# Patient Record
Sex: Male | Born: 1955 | Race: White | Hispanic: No | Marital: Married | Smoking: Current some day smoker
Health system: Southern US, Community
[De-identification: ages and names within clinical notes are randomized; demographics above are authoritative.]

## PROBLEM LIST (undated history)

## (undated) DIAGNOSIS — F319 Bipolar disorder, unspecified: Secondary | ICD-10-CM

## (undated) DIAGNOSIS — E7800 Pure hypercholesterolemia, unspecified: Secondary | ICD-10-CM

## (undated) DIAGNOSIS — I8500 Esophageal varices without bleeding: Secondary | ICD-10-CM

## (undated) DIAGNOSIS — D649 Anemia, unspecified: Secondary | ICD-10-CM

## (undated) DIAGNOSIS — G629 Polyneuropathy, unspecified: Secondary | ICD-10-CM

## (undated) DIAGNOSIS — F419 Anxiety disorder, unspecified: Secondary | ICD-10-CM

## (undated) DIAGNOSIS — K7460 Unspecified cirrhosis of liver: Secondary | ICD-10-CM

## (undated) DIAGNOSIS — D696 Thrombocytopenia, unspecified: Secondary | ICD-10-CM

## (undated) DIAGNOSIS — K219 Gastro-esophageal reflux disease without esophagitis: Secondary | ICD-10-CM

## (undated) DIAGNOSIS — K7581 Nonalcoholic steatohepatitis (NASH): Secondary | ICD-10-CM

## (undated) DIAGNOSIS — Z9289 Personal history of other medical treatment: Secondary | ICD-10-CM

## (undated) DIAGNOSIS — E119 Type 2 diabetes mellitus without complications: Secondary | ICD-10-CM

## (undated) DIAGNOSIS — I82409 Acute embolism and thrombosis of unspecified deep veins of unspecified lower extremity: Secondary | ICD-10-CM

## (undated) DIAGNOSIS — F209 Schizophrenia, unspecified: Secondary | ICD-10-CM

## (undated) DIAGNOSIS — F329 Major depressive disorder, single episode, unspecified: Secondary | ICD-10-CM

## (undated) DIAGNOSIS — J45909 Unspecified asthma, uncomplicated: Secondary | ICD-10-CM

## (undated) DIAGNOSIS — F32A Depression, unspecified: Secondary | ICD-10-CM

## (undated) HISTORY — PX: IVC FILTER PLACEMENT (ARMC HX): HXRAD1551

## (undated) HISTORY — PX: US GUIDED PARACENTESIS (ARMC HX): HXRAD1166

## (undated) HISTORY — DX: Acute embolism and thrombosis of unspecified deep veins of unspecified lower extremity: I82.409

## (undated) HISTORY — PX: CARDIAC CATHETERIZATION: SHX172

## (undated) HISTORY — PX: CATARACT EXTRACTION W/ INTRAOCULAR LENS IMPLANT: SHX1309

## (undated) HISTORY — PX: FINGER SURGERY: SHX640

---

## 1998-05-14 ENCOUNTER — Emergency Department (HOSPITAL_COMMUNITY): Admission: EM | Admit: 1998-05-14 | Discharge: 1998-05-14 | Payer: Self-pay | Admitting: Emergency Medicine

## 2002-09-19 ENCOUNTER — Emergency Department (HOSPITAL_COMMUNITY): Admission: EM | Admit: 2002-09-19 | Discharge: 2002-09-19 | Payer: Self-pay | Admitting: Emergency Medicine

## 2002-09-19 ENCOUNTER — Encounter: Payer: Self-pay | Admitting: Emergency Medicine

## 2004-02-17 ENCOUNTER — Ambulatory Visit (HOSPITAL_COMMUNITY): Admission: RE | Admit: 2004-02-17 | Discharge: 2004-02-17 | Payer: Self-pay | Admitting: Gastroenterology

## 2006-03-21 ENCOUNTER — Encounter: Admission: RE | Admit: 2006-03-21 | Discharge: 2006-03-21 | Payer: Self-pay | Admitting: Occupational Medicine

## 2016-01-19 ENCOUNTER — Inpatient Hospital Stay (HOSPITAL_COMMUNITY)
Admission: EM | Admit: 2016-01-19 | Discharge: 2016-01-22 | DRG: 442 | Disposition: A | Discharge status: Home / Self Care | Payer: Non-veteran care | Attending: Internal Medicine | Admitting: Internal Medicine

## 2016-01-19 ENCOUNTER — Encounter (HOSPITAL_COMMUNITY): Payer: Self-pay | Admitting: Vascular Surgery

## 2016-01-19 ENCOUNTER — Emergency Department (HOSPITAL_COMMUNITY): Payer: Non-veteran care

## 2016-01-19 DIAGNOSIS — R195 Other fecal abnormalities: Secondary | ICD-10-CM | POA: Diagnosis not present

## 2016-01-19 DIAGNOSIS — F17210 Nicotine dependence, cigarettes, uncomplicated: Secondary | ICD-10-CM | POA: Diagnosis present

## 2016-01-19 DIAGNOSIS — T383X6A Underdosing of insulin and oral hypoglycemic [antidiabetic] drugs, initial encounter: Secondary | ICD-10-CM | POA: Diagnosis present

## 2016-01-19 DIAGNOSIS — I252 Old myocardial infarction: Secondary | ICD-10-CM

## 2016-01-19 DIAGNOSIS — I8510 Secondary esophageal varices without bleeding: Secondary | ICD-10-CM | POA: Diagnosis present

## 2016-01-19 DIAGNOSIS — E119 Type 2 diabetes mellitus without complications: Secondary | ICD-10-CM

## 2016-01-19 DIAGNOSIS — Z8711 Personal history of peptic ulcer disease: Secondary | ICD-10-CM

## 2016-01-19 DIAGNOSIS — Z720 Tobacco use: Secondary | ICD-10-CM

## 2016-01-19 DIAGNOSIS — E118 Type 2 diabetes mellitus with unspecified complications: Secondary | ICD-10-CM

## 2016-01-19 DIAGNOSIS — Y92009 Unspecified place in unspecified non-institutional (private) residence as the place of occurrence of the external cause: Secondary | ICD-10-CM

## 2016-01-19 DIAGNOSIS — Z794 Long term (current) use of insulin: Secondary | ICD-10-CM

## 2016-01-19 DIAGNOSIS — R079 Chest pain, unspecified: Secondary | ICD-10-CM | POA: Diagnosis present

## 2016-01-19 DIAGNOSIS — Z7982 Long term (current) use of aspirin: Secondary | ICD-10-CM

## 2016-01-19 DIAGNOSIS — K7460 Unspecified cirrhosis of liver: Secondary | ICD-10-CM | POA: Diagnosis present

## 2016-01-19 DIAGNOSIS — K921 Melena: Secondary | ICD-10-CM

## 2016-01-19 DIAGNOSIS — D62 Acute posthemorrhagic anemia: Secondary | ICD-10-CM | POA: Diagnosis present

## 2016-01-19 DIAGNOSIS — D649 Anemia, unspecified: Secondary | ICD-10-CM | POA: Diagnosis present

## 2016-01-19 DIAGNOSIS — Z8719 Personal history of other diseases of the digestive system: Secondary | ICD-10-CM

## 2016-01-19 DIAGNOSIS — Z86718 Personal history of other venous thrombosis and embolism: Secondary | ICD-10-CM

## 2016-01-19 DIAGNOSIS — E7800 Pure hypercholesterolemia, unspecified: Secondary | ICD-10-CM | POA: Diagnosis present

## 2016-01-19 DIAGNOSIS — Z91128 Patient's intentional underdosing of medication regimen for other reason: Secondary | ICD-10-CM

## 2016-01-19 DIAGNOSIS — F17200 Nicotine dependence, unspecified, uncomplicated: Secondary | ICD-10-CM | POA: Diagnosis present

## 2016-01-19 DIAGNOSIS — Z9114 Patient's other noncompliance with medication regimen: Secondary | ICD-10-CM

## 2016-01-19 DIAGNOSIS — K210 Gastro-esophageal reflux disease with esophagitis: Secondary | ICD-10-CM | POA: Diagnosis present

## 2016-01-19 DIAGNOSIS — K766 Portal hypertension: Secondary | ICD-10-CM | POA: Diagnosis not present

## 2016-01-19 DIAGNOSIS — K219 Gastro-esophageal reflux disease without esophagitis: Secondary | ICD-10-CM | POA: Diagnosis not present

## 2016-01-19 DIAGNOSIS — R739 Hyperglycemia, unspecified: Secondary | ICD-10-CM

## 2016-01-19 DIAGNOSIS — K922 Gastrointestinal hemorrhage, unspecified: Secondary | ICD-10-CM | POA: Diagnosis present

## 2016-01-19 DIAGNOSIS — K3189 Other diseases of stomach and duodenum: Secondary | ICD-10-CM | POA: Diagnosis present

## 2016-01-19 DIAGNOSIS — I493 Ventricular premature depolarization: Secondary | ICD-10-CM | POA: Diagnosis present

## 2016-01-19 DIAGNOSIS — E1142 Type 2 diabetes mellitus with diabetic polyneuropathy: Secondary | ICD-10-CM | POA: Diagnosis present

## 2016-01-19 DIAGNOSIS — D696 Thrombocytopenia, unspecified: Secondary | ICD-10-CM | POA: Diagnosis present

## 2016-01-19 DIAGNOSIS — E1165 Type 2 diabetes mellitus with hyperglycemia: Secondary | ICD-10-CM | POA: Diagnosis present

## 2016-01-19 DIAGNOSIS — K7581 Nonalcoholic steatohepatitis (NASH): Secondary | ICD-10-CM | POA: Diagnosis present

## 2016-01-19 HISTORY — DX: Pure hypercholesterolemia, unspecified: E78.00

## 2016-01-19 HISTORY — DX: Type 2 diabetes mellitus without complications: E11.9

## 2016-01-19 HISTORY — DX: Thrombocytopenia, unspecified: D69.6

## 2016-01-19 HISTORY — DX: Polyneuropathy, unspecified: G62.9

## 2016-01-19 HISTORY — DX: Unspecified cirrhosis of liver: K74.60

## 2016-01-19 HISTORY — DX: Esophageal varices without bleeding: I85.00

## 2016-01-19 LAB — I-STAT VENOUS BLOOD GAS, ED
ACID-BASE DEFICIT: 1 mmol/L (ref 0.0–2.0)
Bicarbonate: 21.3 mmol/L (ref 20.0–28.0)
O2 SAT: 99 %
PO2 VEN: 110 mmHg — AB (ref 32.0–45.0)
TCO2: 22 mmol/L (ref 0–100)
pCO2, Ven: 28.2 mmHg — ABNORMAL LOW (ref 44.0–60.0)
pH, Ven: 7.485 — ABNORMAL HIGH (ref 7.250–7.430)

## 2016-01-19 LAB — COMPREHENSIVE METABOLIC PANEL
ALK PHOS: 92 U/L (ref 38–126)
ALT: 64 U/L — AB (ref 17–63)
AST: 63 U/L — AB (ref 15–41)
Albumin: 3.4 g/dL — ABNORMAL LOW (ref 3.5–5.0)
Anion gap: 9 (ref 5–15)
BUN: 11 mg/dL (ref 6–20)
CALCIUM: 8.8 mg/dL — AB (ref 8.9–10.3)
CHLORIDE: 105 mmol/L (ref 101–111)
CO2: 20 mmol/L — AB (ref 22–32)
CREATININE: 0.64 mg/dL (ref 0.61–1.24)
GFR calc Af Amer: >60 mL/min (ref >60)
GFR calc non Af Amer: >60 mL/min (ref >60)
GLUCOSE: 411 mg/dL — AB (ref 65–99)
Potassium: 4 mmol/L (ref 3.5–5.1)
SODIUM: 134 mmol/L — AB (ref 135–145)
Total Bilirubin: 1.7 mg/dL — ABNORMAL HIGH (ref 0.3–1.2)
Total Protein: 6.1 g/dL — ABNORMAL LOW (ref 6.5–8.1)

## 2016-01-19 LAB — I-STAT TROPONIN, ED: TROPONIN I, POC: 0 ng/mL (ref 0.00–0.08)

## 2016-01-19 LAB — CBC WITH DIFFERENTIAL/PLATELET
BASOS ABS: 0 10*3/uL (ref 0.0–0.1)
Basophils Relative: 1 %
EOS ABS: 0.1 10*3/uL (ref 0.0–0.7)
EOS PCT: 2 %
HCT: 35.7 % — ABNORMAL LOW (ref 39.0–52.0)
HEMOGLOBIN: 12.9 g/dL — AB (ref 13.0–17.0)
LYMPHS ABS: 1.3 10*3/uL (ref 0.7–4.0)
LYMPHS PCT: 30 %
MCH: 31.5 pg (ref 26.0–34.0)
MCHC: 36.1 g/dL — ABNORMAL HIGH (ref 30.0–36.0)
MCV: 87.1 fL (ref 78.0–100.0)
Monocytes Absolute: 0.3 10*3/uL (ref 0.1–1.0)
Monocytes Relative: 6 %
NEUTROS PCT: 61 %
Neutro Abs: 2.7 10*3/uL (ref 1.7–7.7)
PLATELETS: 62 10*3/uL — AB (ref 150–400)
RBC: 4.1 MIL/uL — AB (ref 4.22–5.81)
RDW: 13.2 % (ref 11.5–15.5)
WBC: 4.4 10*3/uL (ref 4.0–10.5)

## 2016-01-19 LAB — PROTIME-INR
INR: 1.17
Prothrombin Time: 15 seconds (ref 11.4–15.2)

## 2016-01-19 LAB — APTT: APTT: 25 s (ref 24–36)

## 2016-01-19 LAB — POC OCCULT BLOOD, ED: FECAL OCCULT BLD: POSITIVE — AB

## 2016-01-19 LAB — TYPE AND SCREEN
ABO/RH(D): A POS
Antibody Screen: NEGATIVE

## 2016-01-19 LAB — CBG MONITORING, ED: Glucose-Capillary: 341 mg/dL — ABNORMAL HIGH (ref 65–99)

## 2016-01-19 LAB — BRAIN NATRIURETIC PEPTIDE: B NATRIURETIC PEPTIDE 5: 26.8 pg/mL (ref 0.0–100.0)

## 2016-01-19 LAB — ABO/RH: ABO/RH(D): A POS

## 2016-01-19 MED ORDER — INSULIN DETEMIR 100 UNIT/ML ~~LOC~~ SOLN
20.0000 [IU] | Freq: Every day | SUBCUTANEOUS | Status: DC
  Administered 2016-01-20 (×2): 20 [IU] via SUBCUTANEOUS
  Filled 2016-01-19 (×3): qty 0.2

## 2016-01-19 MED ORDER — ONDANSETRON HCL 4 MG/2ML IJ SOLN: 4.0000 mg | Freq: Four times a day (QID) | INTRAMUSCULAR | Status: DC | PRN

## 2016-01-19 MED ORDER — INSULIN ASPART 100 UNIT/ML ~~LOC~~ SOLN
0.0000 [IU] | SUBCUTANEOUS | Status: DC
  Administered 2016-01-20: 8 [IU] via SUBCUTANEOUS

## 2016-01-19 MED ORDER — GABAPENTIN 300 MG PO CAPS
900.0000 mg | ORAL_CAPSULE | Freq: Every day | ORAL | Status: DC
  Administered 2016-01-20 – 2016-01-21 (×3): 900 mg via ORAL
  Filled 2016-01-19 (×3): qty 3

## 2016-01-19 MED ORDER — VITAMIN B-12 1000 MCG PO TABS
500.0000 ug | ORAL_TABLET | Freq: Every day | ORAL | Status: DC
  Administered 2016-01-20 – 2016-01-22 (×3): 500 ug via ORAL
  Filled 2016-01-19 (×3): qty 1

## 2016-01-19 MED ORDER — LORATADINE 10 MG PO TABS
10.0000 mg | ORAL_TABLET | Freq: Every day | ORAL | Status: DC
  Administered 2016-01-20 – 2016-01-22 (×3): 10 mg via ORAL
  Filled 2016-01-19 (×3): qty 1

## 2016-01-19 MED ORDER — SODIUM CHLORIDE 0.9 % IV SOLN
INTRAVENOUS | Status: DC
  Administered 2016-01-20: 1000 mL via INTRAVENOUS
  Administered 2016-01-20: 15:00:00 via INTRAVENOUS

## 2016-01-19 MED ORDER — SODIUM CHLORIDE 0.9 % IV BOLUS (SEPSIS)
1000.0000 mL | Freq: Once | INTRAVENOUS | Status: AC
  Administered 2016-01-19: 1000 mL via INTRAVENOUS

## 2016-01-19 MED ORDER — FOLIC ACID 1 MG PO TABS
1.0000 mg | ORAL_TABLET | Freq: Every day | ORAL | Status: DC
  Administered 2016-01-20 – 2016-01-22 (×3): 1 mg via ORAL
  Filled 2016-01-19 (×3): qty 1

## 2016-01-19 MED ORDER — NADOLOL 20 MG PO TABS
20.0000 mg | ORAL_TABLET | Freq: Every day | ORAL | Status: DC
  Administered 2016-01-20 – 2016-01-22 (×3): 20 mg via ORAL
  Filled 2016-01-19 (×3): qty 1

## 2016-01-19 MED ORDER — MORPHINE SULFATE (PF) 2 MG/ML IV SOLN: 2.0000 mg | INTRAVENOUS | Status: DC | PRN

## 2016-01-19 MED ORDER — PANTOPRAZOLE SODIUM 40 MG PO TBEC: 40.0000 mg | DELAYED_RELEASE_TABLET | Freq: Two times a day (BID) | ORAL | Status: DC

## 2016-01-19 MED ORDER — ACETAMINOPHEN 325 MG PO TABS: 650.0000 mg | ORAL_TABLET | ORAL | Status: DC | PRN

## 2016-01-19 NOTE — H&P (Signed)
History and Physical    Wayne JunglingDuke L Atkin ONG:[id number] DOB: Feb 09, 1956 DOA: 01/19/2016  Referring MD/NP/PA: Sharilyn SitesLisa Sanders, PA-C PCP: Pcp Not In System  Patient coming from:  Home  Chief Complaint:  Chest Pain  HPI: Wayne JunglingDuke L Palo is a 60 y.o. male with medical history significant of cirrhosis with esophageal varices, PUD, GI bleed, DVT s/p IVC, thrombocytopenia, tobacco abuse; who presents with a 2-3 week history of chest pain. Symptoms had occurred intermittently and are located substernal to epigastric region. Today however symptoms were more constant. Describes pain more so as a pressure sensation and rates it a 5 out of 10 on the pain scale. Symptoms worse with exertion. Associated symptoms include some mild shortness of breath and fatigue. Denies any radiation of pain, fever, chills, diaphoresis, abdominal swelling, nausea, or vomiting. He has had a lot of social stressors going on for the last few weeks and therefore has not been consistently taking any of his medications. The last 2 days or so he's also noticed that his stools are black and tarry in nature. Patient notes that the symptoms feel like it could possibly be acid reflux. [facility] [place].  Previously, he had issues with bleeding and was found to have esophageal varices. He notes that he does smoke anywhere from 5-9 cigarettes per day.  ED Course: Plan admission to the emergency department patient was seen to be afebrile with respirations of 25, and all other vitals within normal limits. Laboratory work revealed hemoglobin 12.9, platelets 62, sodium 134, potassium 4, chloride 105, CO2 20, BUN 11, creatinine 0.64, calcium 8.8, glucose 411, and anion gap 9. He was noted to be guiac positive. Initial troponin negative. EKG showing intermittent PVCs, but no significant ischemic signs. Chest x-ray showed no acute abnormalities.  Review of Systems: As per HPI otherwise 10 point review of systems negative.    Past Medical History:  Diagnosis Date  . Diabetes mellitus without complication (HCC)   . DVT (deep vein thrombosis) in pregnancy   . Esophageal varices (HCC)   . Hypercholesteremia   . Non-alcoholic cirrhosis (HCC)   . Peripheral neuropathy (HCC)   . Thrombocytopenia (HCC)     Past Surgical History:  Procedure Laterality Date  . FINGER SURGERY    . IVC FILTER PLACEMENT ([facility] HX)    . US GUIDED PARACENTESIS ([facility] HX)       reports that he has been smoking Cigarettes.  He has been smoking about 0.25 packs per day. He has never used smokeless tobacco. He reports that he does not drink alcohol or use drugs.  No Known Allergies  No family history on file.  Prior to Admission medications   Medication Sig Start Date End Date Taking? Authorizing Provider  acetaminophen (TYLENOL) 500 MG tablet Take 1,500 mg by mouth every 6 (six) hours as needed for mild pain.   Yes Historical Provider, MD  aspirin 325 MG tablet Take 325 mg by mouth daily.   Yes Historical Provider, MD  cetirizine (ZYRTEC) 10 MG tablet Take 10 mg by mouth daily.   Yes Historical Provider, MD  cyanocobalamin 500 MCG tablet Take 500 mcg by mouth daily.   Yes Historical Provider, MD  folic acid (FOLVITE) 1 MG tablet Take 1 mg by mouth daily.   Yes Historical Provider, MD  gabapentin (NEURONTIN) 300 MG capsule Take 900 mg by mouth at bedtime.   Yes Historical Provider, MD  insulin regular human CONCENTRATED (HUMULIN R) 500 UNIT/ML injection  Inject 100 Units into the skin 2 (two) times daily.   Yes Historical Provider, MD  Multiple Vitamins-Minerals (PRESERVISION AREDS 2 PO) Take 2 capsules by mouth daily.   Yes Historical Provider, MD  nadolol (CORGARD) 20 MG tablet Take 20 mg by mouth daily.   Yes Historical Provider, MD  ondansetron (ZOFRAN) 8 MG tablet Take 8 mg by mouth every 8 (eight) hours as needed for nausea or vomiting.   Yes Historical Provider, MD  pantoprazole (PROTONIX) 40 MG tablet Take 40 mg by mouth 2  (two) times daily.   Yes Historical Provider, MD  sertraline (ZOLOFT) 100 MG tablet Take 100 mg by mouth daily.   Yes Historical Provider, MD    Physical Exam:  Constitutional: Older male in NAD, calm, comfortable Vitals:   01/19/16 2149 01/19/16 2215 01/19/16 2230 01/19/16 2245  BP: 147/84 142/83 133/82 125/80  Pulse: 82 85 88 83  Resp: 17 21 20 25   Temp:      TempSrc:      SpO2: 99% 92% 92% 93%   Eyes: PERRL, lids and conjunctivae normal ENMT: Mucous membranes are moist. Posterior pharynx clear of any exudate or lesions.Normal dentition.  Neck: normal, supple, no masses, no thyromegaly Respiratory: clear to auscultation bilaterally, no wheezing, no crackles. Normal respiratory effort. No accessory muscle use.  Cardiovascular: Regular rate and rhythm, no murmurs / rubs / gallops. No extremity edema. 2+ pedal pulses. No carotid bruits.  Abdomen: no tenderness or significant distention, no masses palpated. No hepatosplenomegaly. Bowel sounds positive.  Musculoskeletal: no clubbing / cyanosis. No joint deformity upper and lower extremities. Good ROM, no contractures. Normal muscle tone.  Skin: no rashes, lesions, ulcers. No induration Neurologic: CN 2-12 grossly intact. Sensation intact, DTR normal. Strength 5/5 in all 4.  Psychiatric: Poor judgment and insight. Alert and oriented x 3. Normal mood.     Labs on Admission: I have personally reviewed following labs and imaging studies  CBC:  Recent Labs Lab 01/19/16 2133  WBC 4.4  NEUTROABS 2.7  HGB 12.9*  HCT 35.7*  MCV 87.1  PLT 62*   Basic Metabolic Panel:  Recent Labs Lab 01/19/16 2133  NA 134*  K 4.0  CL 105  CO2 20*  GLUCOSE 411*  BUN 11  CREATININE 0.64  CALCIUM 8.8*   GFR: CrCl cannot be calculated (Unknown ideal weight.). Liver Function Tests:  Recent Labs Lab 01/19/16 2133  AST 63*  ALT 64*  ALKPHOS 92  BILITOT 1.7*  PROT 6.1*  ALBUMIN 3.4*   No results for input(s): LIPASE, AMYLASE in the  last 168 hours. No results for input(s): AMMONIA in the last 168 hours. Coagulation Profile:  Recent Labs Lab 01/19/16 2133  INR 1.17   Cardiac Enzymes: No results for input(s): CKTOTAL, CKMB, CKMBINDEX, TROPONINI in the last 168 hours. BNP (last 3 results) No results for input(s): PROBNP in the last 8760 hours. HbA1C: No results for input(s): HGBA1C in the last 72 hours. CBG:  Recent Labs Lab 01/19/16 2251  GLUCAP 341*   Lipid Profile: No results for input(s): CHOL, HDL, LDLCALC, TRIG, CHOLHDL, LDLDIRECT in the last 72 hours. Thyroid Function Tests: No results for input(s): TSH, T4TOTAL, FREET4, T3FREE, THYROIDAB in the last 72 hours. Anemia Panel: No results for input(s): VITAMINB12, FOLATE, FERRITIN, TIBC, IRON, RETICCTPCT in the last 72 hours. Urine analysis: No results found for: COLORURINE, APPEARANCEUR, LABSPEC, PHURINE, GLUCOSEU, HGBUR, BILIRUBINUR, KETONESUR, PROTEINUR, UROBILINOGEN, NITRITE, LEUKOCYTESUR Sepsis Labs: No results found for this or any previous visit (  from the past 240 hour(s)).   Radiological Exams on Admission: Dg Chest 2 View  Result Date: 01/19/2016 CLINICAL DATA:  Substernal and epigastric chest pain with fatigue. Symptoms for 2-3 weeks. Shortness of breath. EXAM: CHEST  2 VIEW COMPARISON:  03/21/2006 FINDINGS: Normal heart size and pulmonary vascularity. No focal airspace disease or consolidation in the lungs. No blunting of costophrenic angles. No pneumothorax. Mediastinal contours appear intact. Degenerative changes in the spine. IMPRESSION: No active cardiopulmonary disease. Electronically Signed   By: Burman Nieves M.D.   On: 01/19/2016 21:43    EKG: Independently reviewed. Sinus rhythm with multiple PVCs  Assessment/Plan Chest Pain: Acute. Intermittent over the last 2-3 weeks. Heart score = 4. Patient notes being consistently compliant with medication regimens. Question of symptoms could be secondary to acid reflux. - Admit to a  telemetry bed  - Trend troponins - Recheck EKG in a.m.  - Check echocardiogram - GI cocktail prn indigestion - Morphine prn pain  Hyperglycemia with diabetes mellitus type 2, uncontrolled: Initial blood glucose was noted to be as high as 523.  - Continue IV fluids normal saline at 100 ml/hr - Levemir 20 units subcutaneous twice a day, adjust dose as needed - CBG with moderate SSI q AC  - Monitor electrolytes  Guaiac positive stool: Patient found to be guaiac positive stools, but hemodynamically stable with hemoglobin higher than baseline. Previous history of PUD and patient noncompliant with Medications. - monitor blood counts - Protonix 40 mg po BID - Consider need of consultation to gastroenterology in a.m.  Liver cirrhosis with esophageal varices: Last EGD noted some time last year showing esophageal varices.  - Continue nadolol  Anemia: Hemoglobin 12.8 on admission - Continue folic acid and vitamin B12   Thrombocytopenia: Chronic - Continue to monitor  Peripheral neuropathy - Continue gabapentin   Tobacco abuse - Counseled on the need of cessation of tobacco abuse   GERD - Continue Protonix   DVT prophylaxis: SCD  Code Status: Full Family Communication: no family present at bedside Disposition Plan: Possible discharge medically stable and Consults called: none Admission status: Observation telemetry  Clydie Braun MD [facility] Pager [phone]  If 7PM-7AM, please contact night-coverage [email or web address] Password [facility]  01/19/2016, 10:57 PM

## 2016-01-19 NOTE — ED Triage Notes (Signed)
Pt reports to the ED for eval of substernal/epigastric CP. Pt reports he has been having this pain x 2-3 weeks. He has also had some exertional SOB as well. Pt also reports he has been having dark tarry stools. Pt has hx of GERD. He has also been hyperglycemic recently patient attributes it to stress. CBG 523 mg/dl upon EMS arrival. 12 lead en route unremarkable.

## 2016-01-19 NOTE — ED Provider Notes (Signed)
[facility]-EMERGENCY DEPT Provider Note   CSN: [id number] Arrival date & time: 01/19/16  1956     History   Chief Complaint Chief Complaint  Patient presents with  . Chest Pain    HPI Wayne Benson is a 60 y.o. male.  The history is provided by the patient and medical records.  Chest Pain   Associated symptoms include shortness of breath.   60 year old male with history of diabetes, DVT status post IVC filter placement, hyperlipidemia, cirrhosis secondary to diabetic medications, esophageal varices, neuropathy, presenting to the ED for chest pain and fatigue. Patient reports he has pain in his mid/substernal region for the past 2 weeks. States this is been waxing and waning and is very mild but is still a notable discomfort. He reports some shortness of breath, mostly with exertional activities. He has not had any diaphoresis, nausea, vomiting, neck or arm pain. States he was told he may have had a mild heart attack in the past, however this was not confirmed. He has not had any stents or CABG. States he smokes a few cigarettes a day routinely.  Patient also complains of dark, tarry stools. States stools have been black in color, no frank blood noted. He denies any abdominal or rectal pain. States he did have a colonoscopy last year which he thinks was normal. He is not currently on anticoagulation. States he feels somewhat fatigued.  No dizziness or syncope.  Was taking iron and B12 supplements but after his last blood check he stopped taking them because he was told his levels were normal.  On arrival to ED patient is hyperglycemic with sugar in the 500s. He reports he was started on a new insulin recently, but has not been taking this as directed. He states he has a lot of "stress" in his life and has been off of his medications recently.  Past Medical History:  Diagnosis Date  . Diabetes mellitus without complication (HCC)   . DVT (deep vein thrombosis) in pregnancy   . Esophageal  varices (HCC)   . Hypercholesteremia   . Non-alcoholic cirrhosis (HCC)   . Peripheral neuropathy (HCC)   . Thrombocytopenia (HCC)     There are no active problems to display for this patient.   Past Surgical History:  Procedure Laterality Date  . FINGER SURGERY    . IVC FILTER PLACEMENT ([facility] HX)    . US GUIDED PARACENTESIS ([facility] HX)         Home Medications    Prior to Admission medications   Not on File    Family History No family history on file.  Social History Social History  Substance Use Topics  . Smoking status: Current Every Day Smoker    Packs/day: 0.25    Types: Cigarettes  . Smokeless tobacco: Never Used  . Alcohol use No     Allergies   Review of patient's allergies indicates not on file.   Review of Systems Review of Systems  Constitutional: Positive for fatigue.  Respiratory: Positive for shortness of breath.   Cardiovascular: Positive for chest pain.  Gastrointestinal:       Dark stools  All other systems reviewed and are negative.    Physical Exam Updated Vital Signs BP 144/89 (BP Location: Right Arm)   Pulse 85   Temp 98.4 F (36.9 C) (Oral)   Resp 16   SpO2 98%   Physical Exam  Constitutional: He is oriented to person, place, and time. He appears well-developed and well-nourished.  HENT:  Head: Normocephalic and atraumatic.  Mouth/Throat: Oropharynx is clear and moist.  Eyes: Conjunctivae and EOM are normal. Pupils are equal, round, and reactive to light.  Neck: Normal range of motion.  Cardiovascular: Normal rate, regular rhythm and normal heart sounds.   Pulmonary/Chest: Effort normal and breath sounds normal. No respiratory distress. He has no wheezes. He has no rhonchi.  No distress, lungs overall clear, speaking in full sentences without difficulty  Abdominal: Soft. Bowel sounds are normal.  Genitourinary:  Genitourinary Comments: Rectum normal in appearance, no hemorrhoids or masses, dark brown stool in rectum; no  frank blood  Musculoskeletal: Normal range of motion.  No leg edema No calf pain, asymmetry, tenderness, or overlying skin changes  Neurological: He is alert and oriented to person, place, and time.  Skin: Skin is warm and dry.  Psychiatric: He has a normal mood and affect.  Nursing note and vitals reviewed.    ED Treatments / Results  Labs (all labs ordered are listed, but only abnormal results are displayed) Labs Reviewed  CBC WITH DIFFERENTIAL/PLATELET - Abnormal; Notable for the following:       Result Value   RBC 4.10 (*)    Hemoglobin 12.9 (*)    HCT 35.7 (*)    MCHC 36.1 (*)    Platelets 62 (*)    All other components within normal limits  COMPREHENSIVE METABOLIC PANEL - Abnormal; Notable for the following:    Sodium 134 (*)    CO2 20 (*)    Glucose, Bld 411 (*)    Calcium 8.8 (*)    Total Protein 6.1 (*)    Albumin 3.4 (*)    AST 63 (*)    ALT 64 (*)    Total Bilirubin 1.7 (*)    All other components within normal limits  POC OCCULT BLOOD, ED - Abnormal; Notable for the following:    Fecal Occult Bld POSITIVE (*)    All other components within normal limits  I-STAT VENOUS BLOOD GAS, ED - Abnormal; Notable for the following:    pH, Ven 7.485 (*)    pCO2, Ven 28.2 (*)    pO2, Ven 110.0 (*)    All other components within normal limits  CBG MONITORING, ED - Abnormal; Notable for the following:    Glucose-Capillary 341 (*)    All other components within normal limits  PROTIME-INR  APTT  BRAIN NATRIURETIC PEPTIDE  D-DIMER, QUANTITATIVE (NOT AT [facility])  TROPONIN I  TROPONIN I  TROPONIN I  HEMOGLOBIN AND HEMATOCRIT, BLOOD  HEMOGLOBIN AND HEMATOCRIT, BLOOD  HEMOGLOBIN AND HEMATOCRIT, BLOOD  BASIC METABOLIC PANEL  BASIC METABOLIC PANEL  CALCIUM, IONIZED  I-STAT TROPOININ, ED  TYPE AND SCREEN  ABO/RH    EKG  EKG Interpretation None       Radiology Dg Chest 2 View  Result Date: 01/19/2016 CLINICAL DATA:  Substernal and epigastric chest pain with  fatigue. Symptoms for 2-3 weeks. Shortness of breath. EXAM: CHEST  2 VIEW COMPARISON:  03/21/2006 FINDINGS: Normal heart size and pulmonary vascularity. No focal airspace disease or consolidation in the lungs. No blunting of costophrenic angles. No pneumothorax. Mediastinal contours appear intact. Degenerative changes in the spine. IMPRESSION: No active cardiopulmonary disease. Electronically Signed   By: Burman NievesWilliam  Stevens M.D.   On: 01/19/2016 21:43    Procedures Procedures (including critical care time)  Medications Ordered in ED Medications  pantoprazole (PROTONIX) EC tablet 40 mg (not administered)  nadolol (CORGARD) tablet 20 mg (not administered)  loratadine (  CLARITIN) tablet 10 mg (not administered)  vitamin B-12 (CYANOCOBALAMIN) tablet 500 mcg (not administered)  folic acid (FOLVITE) tablet 1 mg (not administered)  gabapentin (NEURONTIN) capsule 900 mg (not administered)  acetaminophen (TYLENOL) tablet 650 mg (not administered)  ondansetron (ZOFRAN) injection 4 mg (not administered)  insulin detemir (LEVEMIR) injection 20 Units (not administered)  insulin aspart (novoLOG) injection 0-15 Units (not administered)  morphine 2 MG/ML injection 2 mg (not administered)  0.9 %  sodium chloride infusion (not administered)  sodium chloride 0.9 % bolus 1,000 mL (0 mLs Intravenous Stopped 01/19/16 2153)  sodium chloride 0.9 % bolus 1,000 mL (1,000 mLs Intravenous New Bag/Given 01/19/16 2253)     Initial Impression / Assessment and Plan / ED Course  I have reviewed the triage vital signs and the nursing notes.  Pertinent labs & imaging results that were available during my care of the patient were reviewed by me and considered in my medical decision making (see chart for details).  Clinical Course   60 year old male here with multiple complaints. Notably, chest pain, dark stools, and fatigue. He was hyperglycemic on arrival. He reports he has been off of all his medications for the past few  weeks due to "stress". He is afebrile and nontoxic in appearance. Vital signs are stable on room air. EKG without acute ischemia. Troponin negative. Chest x-ray is clear. He does have fecal occult positive stools, hemoglobin stable at 12.9 which is an improvement from prior. He does have a known baseline anemia as well. Patient remains hyperglycemic, some improvement after IV fluids. Anion gap remains normal at 9, bicarbonate 20. VBG reassuring. No evidence of DKA at this time. Patient with multiple issues currently, will admit for further management.  Discussed with Dr. Katrinka Blazing of [facility]-- will add d-dimer, admit to tele, observation status.  Final Clinical Impressions(s) / ED Diagnoses   Final diagnoses:  Chest pain, unspecified chest pain type  Blood in stool  Hyperglycemia    New Prescriptions Current Discharge Medication List       Garlon Hatchet, PA-C 01/20/16 0002    Canary Brim Tegeler, MD 01/20/16 [phone]

## 2016-01-20 ENCOUNTER — Encounter (HOSPITAL_COMMUNITY): Payer: Self-pay

## 2016-01-20 ENCOUNTER — Observation Stay (HOSPITAL_COMMUNITY): Payer: Non-veteran care

## 2016-01-20 DIAGNOSIS — T383X6A Underdosing of insulin and oral hypoglycemic [antidiabetic] drugs, initial encounter: Secondary | ICD-10-CM | POA: Diagnosis present

## 2016-01-20 DIAGNOSIS — K7460 Unspecified cirrhosis of liver: Secondary | ICD-10-CM

## 2016-01-20 DIAGNOSIS — Z794 Long term (current) use of insulin: Secondary | ICD-10-CM | POA: Diagnosis not present

## 2016-01-20 DIAGNOSIS — R195 Other fecal abnormalities: Secondary | ICD-10-CM | POA: Diagnosis present

## 2016-01-20 DIAGNOSIS — K210 Gastro-esophageal reflux disease with esophagitis: Secondary | ICD-10-CM | POA: Diagnosis present

## 2016-01-20 DIAGNOSIS — Z8711 Personal history of peptic ulcer disease: Secondary | ICD-10-CM | POA: Diagnosis not present

## 2016-01-20 DIAGNOSIS — R739 Hyperglycemia, unspecified: Secondary | ICD-10-CM | POA: Diagnosis not present

## 2016-01-20 DIAGNOSIS — D62 Acute posthemorrhagic anemia: Secondary | ICD-10-CM | POA: Diagnosis present

## 2016-01-20 DIAGNOSIS — I252 Old myocardial infarction: Secondary | ICD-10-CM | POA: Diagnosis not present

## 2016-01-20 DIAGNOSIS — I8510 Secondary esophageal varices without bleeding: Secondary | ICD-10-CM | POA: Diagnosis present

## 2016-01-20 DIAGNOSIS — Z9114 Patient's other noncompliance with medication regimen: Secondary | ICD-10-CM | POA: Diagnosis not present

## 2016-01-20 DIAGNOSIS — Z91128 Patient's intentional underdosing of medication regimen for other reason: Secondary | ICD-10-CM | POA: Diagnosis not present

## 2016-01-20 DIAGNOSIS — R0789 Other chest pain: Secondary | ICD-10-CM | POA: Diagnosis not present

## 2016-01-20 DIAGNOSIS — D649 Anemia, unspecified: Secondary | ICD-10-CM | POA: Diagnosis present

## 2016-01-20 DIAGNOSIS — I493 Ventricular premature depolarization: Secondary | ICD-10-CM | POA: Diagnosis present

## 2016-01-20 DIAGNOSIS — K921 Melena: Secondary | ICD-10-CM

## 2016-01-20 DIAGNOSIS — E1165 Type 2 diabetes mellitus with hyperglycemia: Secondary | ICD-10-CM | POA: Diagnosis present

## 2016-01-20 DIAGNOSIS — K219 Gastro-esophageal reflux disease without esophagitis: Secondary | ICD-10-CM | POA: Diagnosis present

## 2016-01-20 DIAGNOSIS — E119 Type 2 diabetes mellitus without complications: Secondary | ICD-10-CM

## 2016-01-20 DIAGNOSIS — E1142 Type 2 diabetes mellitus with diabetic polyneuropathy: Secondary | ICD-10-CM

## 2016-01-20 DIAGNOSIS — E7800 Pure hypercholesterolemia, unspecified: Secondary | ICD-10-CM | POA: Diagnosis present

## 2016-01-20 DIAGNOSIS — K766 Portal hypertension: Secondary | ICD-10-CM | POA: Diagnosis present

## 2016-01-20 DIAGNOSIS — R079 Chest pain, unspecified: Secondary | ICD-10-CM | POA: Diagnosis not present

## 2016-01-20 DIAGNOSIS — Z8719 Personal history of other diseases of the digestive system: Secondary | ICD-10-CM | POA: Diagnosis not present

## 2016-01-20 DIAGNOSIS — K3189 Other diseases of stomach and duodenum: Secondary | ICD-10-CM | POA: Diagnosis present

## 2016-01-20 DIAGNOSIS — K7581 Nonalcoholic steatohepatitis (NASH): Secondary | ICD-10-CM

## 2016-01-20 DIAGNOSIS — F17200 Nicotine dependence, unspecified, uncomplicated: Secondary | ICD-10-CM | POA: Diagnosis present

## 2016-01-20 DIAGNOSIS — D696 Thrombocytopenia, unspecified: Secondary | ICD-10-CM | POA: Diagnosis present

## 2016-01-20 DIAGNOSIS — F17210 Nicotine dependence, cigarettes, uncomplicated: Secondary | ICD-10-CM | POA: Diagnosis present

## 2016-01-20 DIAGNOSIS — K7469 Other cirrhosis of liver: Secondary | ICD-10-CM | POA: Diagnosis present

## 2016-01-20 DIAGNOSIS — K922 Gastrointestinal hemorrhage, unspecified: Secondary | ICD-10-CM | POA: Diagnosis present

## 2016-01-20 DIAGNOSIS — Z7982 Long term (current) use of aspirin: Secondary | ICD-10-CM | POA: Diagnosis not present

## 2016-01-20 DIAGNOSIS — Z720 Tobacco use: Secondary | ICD-10-CM | POA: Diagnosis not present

## 2016-01-20 DIAGNOSIS — Y92009 Unspecified place in unspecified non-institutional (private) residence as the place of occurrence of the external cause: Secondary | ICD-10-CM | POA: Diagnosis not present

## 2016-01-20 DIAGNOSIS — Z86718 Personal history of other venous thrombosis and embolism: Secondary | ICD-10-CM | POA: Diagnosis not present

## 2016-01-20 LAB — CBC WITH DIFFERENTIAL/PLATELET
Basophils Absolute: 0 10*3/uL (ref 0.0–0.1)
Basophils Relative: 0 %
Eosinophils Absolute: 0.1 10*3/uL (ref 0.0–0.7)
Eosinophils Relative: 3 %
HEMATOCRIT: 34.1 % — AB (ref 39.0–52.0)
Hemoglobin: 11.7 g/dL — ABNORMAL LOW (ref 13.0–17.0)
LYMPHS PCT: 39 %
Lymphs Abs: 1.4 10*3/uL (ref 0.7–4.0)
MCH: 30.3 pg (ref 26.0–34.0)
MCHC: 34.3 g/dL (ref 30.0–36.0)
MCV: 88.3 fL (ref 78.0–100.0)
MONO ABS: 0.2 10*3/uL (ref 0.1–1.0)
MONOS PCT: 6 %
NEUTROS ABS: 1.9 10*3/uL (ref 1.7–7.7)
Neutrophils Relative %: 53 %
Platelets: 46 10*3/uL — ABNORMAL LOW (ref 150–400)
RBC: 3.86 MIL/uL — ABNORMAL LOW (ref 4.22–5.81)
RDW: 13.2 % (ref 11.5–15.5)
WBC: 3.6 10*3/uL — ABNORMAL LOW (ref 4.0–10.5)

## 2016-01-20 LAB — TROPONIN I: Troponin I: <0.03 ng/mL (ref <0.03)

## 2016-01-20 LAB — GLUCOSE, CAPILLARY
GLUCOSE-CAPILLARY: 323 mg/dL — AB (ref 65–99)
GLUCOSE-CAPILLARY: 231 mg/dL — AB (ref 65–99)
Glucose-Capillary: 235 mg/dL — ABNORMAL HIGH (ref 65–99)
Glucose-Capillary: 236 mg/dL — ABNORMAL HIGH (ref 65–99)
Glucose-Capillary: 257 mg/dL — ABNORMAL HIGH (ref 65–99)
Glucose-Capillary: 264 mg/dL — ABNORMAL HIGH (ref 65–99)

## 2016-01-20 LAB — BASIC METABOLIC PANEL
Anion gap: 12 (ref 5–15)
BUN: 10 mg/dL (ref 6–20)
CALCIUM: 8.8 mg/dL — AB (ref 8.9–10.3)
CO2: 19 mmol/L — AB (ref 22–32)
CREATININE: 0.55 mg/dL — AB (ref 0.61–1.24)
Chloride: 105 mmol/L (ref 101–111)
GFR calc non Af Amer: >60 mL/min (ref >60)
GLUCOSE: 307 mg/dL — AB (ref 65–99)
Potassium: 3.7 mmol/L (ref 3.5–5.1)
Sodium: 136 mmol/L (ref 135–145)

## 2016-01-20 LAB — D-DIMER, QUANTITATIVE: D-Dimer, Quant: 0.46 ug/mL-FEU (ref 0.00–0.50)

## 2016-01-20 LAB — HEMOGLOBIN AND HEMATOCRIT, BLOOD
HEMATOCRIT: 36.7 % — AB (ref 39.0–52.0)
HEMOGLOBIN: 12.8 g/dL — AB (ref 13.0–17.0)

## 2016-01-20 MED ORDER — FAMOTIDINE IN NACL 20-0.9 MG/50ML-% IV SOLN
20.0000 mg | Freq: Two times a day (BID) | INTRAVENOUS | Status: DC
  Administered 2016-01-20 (×2): 20 mg via INTRAVENOUS
  Filled 2016-01-20 (×3): qty 50

## 2016-01-20 MED ORDER — MAGNESIUM HYDROXIDE 400 MG/5ML PO SUSP: 15.0000 mL | Freq: Every day | ORAL | Status: DC | PRN

## 2016-01-20 MED ORDER — SODIUM CHLORIDE 0.9 % IV SOLN: Freq: Once | INTRAVENOUS | Status: DC

## 2016-01-20 MED ORDER — INFLUENZA VAC SPLIT QUAD 0.5 ML IM SUSY
0.5000 mL | PREFILLED_SYRINGE | INTRAMUSCULAR | Status: DC
  Filled 2016-01-20 (×2): qty 0.5

## 2016-01-20 MED ORDER — INSULIN ASPART 100 UNIT/ML ~~LOC~~ SOLN
0.0000 [IU] | Freq: Three times a day (TID) | SUBCUTANEOUS | Status: DC
  Administered 2016-01-20: 11 [IU] via SUBCUTANEOUS
  Administered 2016-01-20 (×2): 5 [IU] via SUBCUTANEOUS
  Administered 2016-01-21: 15 [IU] via SUBCUTANEOUS
  Administered 2016-01-21: 2 [IU] via SUBCUTANEOUS
  Administered 2016-01-21 – 2016-01-22 (×2): 3 [IU] via SUBCUTANEOUS
  Administered 2016-01-22: 8 [IU] via SUBCUTANEOUS
  Administered 2016-01-22: 5 [IU] via SUBCUTANEOUS

## 2016-01-20 NOTE — Progress Notes (Signed)
  Echocardiogram 2D Echocardiogram has been performed.  Wayne Benson, [name] 01/20/2016, 5:41 PM

## 2016-01-20 NOTE — Progress Notes (Signed)
PROGRESS NOTE    Wayne Benson  ZOX:[id number] DOB: 1956-04-12 DOA: 01/19/2016 PCP: Pcp Not In System     Brief Narrative:  Wayne Benson is a 60 y.o. male with medical history significant of cirrhosis with esophageal varices, PUD, GI bleed, DVT s/p IVC, thrombocytopenia, tobacco abuse; who presents with a 2-3 week history of chest pain. Symptoms had occurred intermittently and are located substernal to epigastric region. Today however symptoms were more constant. Describes pain more so as a pressure sensation and rates it a 5 out of 10 on the pain scale. Symptoms worse with exertion. Associated symptoms include some mild shortness of breath and fatigue. He has had a lot of social stressors going on for the last few weeks and therefore has not been consistently taking any of his medications. The last 2 days or so he's also noticed that his stools are black and tarry in nature. Patient notes that the symptoms feel like it could possibly be acid reflux. He normally follows at the [facility] hospital and [place].  Previously, he had issues with bleeding and was found to have esophageal varices. He notes that he does smoke anywhere from 5-9 cigarettes per day.     Assessment & Plan:   Principal Problem:   Melena Active Problems:   Chest pain   Hyperglycemia   Diabetes (HCC)   Esophageal varices in cirrhosis (HCC)   Anemia   Tobacco abuse   GERD (gastroesophageal reflux disease)   Liver cirrhosis secondary to NASH  Melena  - Guaiac positive stool in ED  - Previous history of PUD, esophageal varices (2016 at [facility]) and patient noncompliant with medications - Protonix 40 mg po BID - Continue folic acid, vit B12  - GI consulted this morning  - Monitor CBC   Liver cirrhosis (secondary to NASH) with esophageal varices - Last EGD (2016 at [facility]) showed non-bleeding esophageal varices - Continue nadolol  Atypical chest pain - Heart score = 4 - Trend troponins x 3 negative  - Repeat EKG: NSR,  no ST-T changes  - Echocardiogram pending  - Less concerned that this is cardiac etiology and more consistent with GI etiology, monitor   Chronic thrombocytopenia - Continue to monitor  Hyperglycemia with diabetes mellitus type 2, uncontrolled, with neuropathy - Has not been compliant with insulin at home - Check Ha1c  - IVF  - Levemir 20 units nightly  - SSI  - Monitor electrolytes - Continue gabapentin   Tobacco abuse - Counseled on the need of cessation of tobacco abuse    DVT prophylaxis: SCD. IVC in place  Code Status: Full Family Communication: no family present Disposition Plan: Consult GI for melena, cirrhosis, esophageal varices. Currently patient remains hemodynamically stable. Monitor CBC.   Consultants:   GI   Procedures:   None  Antimicrobials:   None     Subjective: Patient states that last year, he was evaluated at [facility] after episodes of lower and upper GI bleed. He was diagnosed with cirrhosis, gastritis, esophageal varices. He also developed DVT and IVC was placed. He states that for the past 2-3 weeks, he has had a constant lower chest pain without radiation as well as fatigue. He admits to not being compliant with medications for over a week. He also noticed dark tarry stools for the past 2 days as well. He is currently on aspirin daily. He denies any fevers, chills, shortness of breath, nausea, vomiting.    Objective: Vitals:   01/19/16 2315 01/19/16 2330  01/20/16 0005 01/20/16 0441  BP: 142/89 136/83 (!) 144/87 119/74  Pulse: 90 80 86 79  Resp: 16 22 20 20   Temp:   98 F (36.7 C) 97.7 F (36.5 C)  TempSrc:   Oral Oral  SpO2: 99% 95% 98% 95%  Weight:   80.1 kg (176 lb 9.6 oz)   Height:   5\' 10"  (1.778 m)     Intake/Output Summary (Last 24 hours) at 01/20/16 0758 Last data filed at 01/20/16 0600  Gross per 24 hour  Intake          3666.67 ml  Output              800 ml  Net          2866.67 ml   Filed Weights   01/20/16 0005    Weight: 80.1 kg (176 lb 9.6 oz)    Examination:  General exam: Appears calm and comfortable  Respiratory system: Clear to auscultation. Respiratory effort normal. Cardiovascular system: S1 & S2 heard, RRR. No JVD, murmurs, rubs, gallops or clicks. No pedal edema. Gastrointestinal system: Abdomen is nondistended, soft and nontender. No organomegaly or masses felt. Normal bowel sounds heard. Central nervous system: Alert and oriented. No focal neurological deficits. Extremities: Symmetric 5 x 5 power. Skin: No rashes, lesions or ulcers Psychiatry: Judgement and insight appear normal. Mood & affect appropriate.   Data Reviewed: I have personally reviewed following labs and imaging studies  CBC:  Recent Labs Lab 01/19/16 2133 01/20/16 0008 01/20/16 0606  WBC 4.4  --  3.6*  NEUTROABS 2.7  --  1.9  HGB 12.9* 12.8* 11.7*  HCT 35.7* 36.7* 34.1*  MCV 87.1  --  88.3  PLT 62*  --  46*   Basic Metabolic Panel:  Recent Labs Lab 01/19/16 2133 01/20/16 0008  NA 134* 136  K 4.0 3.7  CL 105 105  CO2 20* 19*  GLUCOSE 411* 307*  BUN 11 10  CREATININE 0.64 0.55*  CALCIUM 8.8* 8.8*   GFR: Estimated Creatinine Clearance: 101.4 mL/min (by C-G formula based on SCr of 0.55 mg/dL (L)). Liver Function Tests:  Recent Labs Lab 01/19/16 2133  AST 63*  ALT 64*  ALKPHOS 92  BILITOT 1.7*  PROT 6.1*  ALBUMIN 3.4*   No results for input(s): LIPASE, AMYLASE in the last 168 hours. No results for input(s): AMMONIA in the last 168 hours. Coagulation Profile:  Recent Labs Lab 01/19/16 2133  INR 1.17   Cardiac Enzymes:  Recent Labs Lab 01/20/16 0008 01/20/16 0249 01/20/16 0606  TROPONINI <0.03 <0.03 <0.03   BNP (last 3 results) No results for input(s): PROBNP in the last 8760 hours. HbA1C: No results for input(s): HGBA1C in the last 72 hours. CBG:  Recent Labs Lab 01/19/16 2251 01/20/16 0023 01/20/16 0433 01/20/16 0631  GLUCAP 341* 264* 257* 235*   Lipid  Profile: No results for input(s): CHOL, HDL, LDLCALC, TRIG, CHOLHDL, LDLDIRECT in the last 72 hours. Thyroid Function Tests: No results for input(s): TSH, T4TOTAL, FREET4, T3FREE, THYROIDAB in the last 72 hours. Anemia Panel: No results for input(s): VITAMINB12, FOLATE, FERRITIN, TIBC, IRON, RETICCTPCT in the last 72 hours. Sepsis Labs: No results for input(s): PROCALCITON, LATICACIDVEN in the last 168 hours.  No results found for this or any previous visit (from the past 240 hour(s)).       Radiology Studies: Dg Chest 2 View  Result Date: 01/19/2016 CLINICAL DATA:  Substernal and epigastric chest pain with fatigue. Symptoms for 2-3 weeks.  Shortness of breath. EXAM: CHEST  2 VIEW COMPARISON:  03/21/2006 FINDINGS: Normal heart size and pulmonary vascularity. No focal airspace disease or consolidation in the lungs. No blunting of costophrenic angles. No pneumothorax. Mediastinal contours appear intact. Degenerative changes in the spine. IMPRESSION: No active cardiopulmonary disease. Electronically Signed   By: Burman Nieves M.D.   On: 01/19/2016 21:43    cxr 9/29 reviewed personally - mild pulmonary congestion, no focal consolidation ekg 9/30 reviewed personally - NSR, normal axis, no ST-T changes     Scheduled Meds: . folic acid  1 mg Oral Daily  . gabapentin  900 mg Oral QHS  . [START ON 01/21/2016] Influenza vac split quadrivalent PF  0.5 mL Intramuscular Tomorrow-1000  . insulin aspart  0-15 Units Subcutaneous TID WC  . insulin detemir  20 Units Subcutaneous QHS  . loratadine  10 mg Oral Daily  . nadolol  20 mg Oral Daily  . pantoprazole  40 mg Oral BID  . vitamin B-12  500 mcg Oral Daily   Continuous Infusions: . sodium chloride 1,000 mL (01/20/16 0032)     LOS: 0 days    Time spent: 40 minutes    Noralee Stain, DO [facility] Pager [phone]  If 7PM-7AM, please contact night-coverage [email or web address] Password [email or web address] 01/20/2016, 7:58 AM

## 2016-01-20 NOTE — Consult Note (Signed)
[facility] Consultation Note  Referring Provider: Dr. Madelyn Flavorsondell Smith [facility] Primary Care Physician:  Pcp Not In System  Reason for Consultation:  melena  HPI: Wayne Benson is a 60 y.o. male whom we've been asked to see for melena.  Patient has history of NASH and cirrhosis with known esophageal non-bleeding varix and portal gastropathy and esophageal ulcer seen on EGD in [place] in April 2016.  Patient denies alcohol use.  He presents to ED with chest pain; cardiac evaluation thus far negative.  We were asked to see for melena.  For the past 2-3 days, patient has had 1-2 episodes of black stools.  Prior history last year of heavy NSAIDs, but denies any since that time.  Has no abdominal pain.  No hematemesis.  No hematochezia.   Past Medical History:  Diagnosis Date  . Diabetes mellitus without complication (HCC)   . DVT (deep vein thrombosis) in pregnancy   . Esophageal varices (HCC)   . Hypercholesteremia   . Non-alcoholic cirrhosis (HCC)   . Peripheral neuropathy (HCC)   . Thrombocytopenia (HCC)     Past Surgical History:  Procedure Laterality Date  . FINGER SURGERY    . IVC FILTER PLACEMENT ([facility] HX)    . US GUIDED PARACENTESIS ([facility] HX)      Prior to Admission medications   Medication Sig Start Date End Date Taking? Authorizing Provider  acetaminophen (TYLENOL) 500 MG tablet Take 1,500 mg by mouth every 6 (six) hours as needed for mild pain.   Yes Historical Provider, MD  aspirin 325 MG tablet Take 325 mg by mouth daily.   Yes Historical Provider, MD  cetirizine (ZYRTEC) 10 MG tablet Take 10 mg by mouth daily.   Yes Historical Provider, MD  cyanocobalamin 500 MCG tablet Take 500 mcg by mouth daily.   Yes Historical Provider, MD  folic acid (FOLVITE) 1 MG tablet Take 1 mg by mouth daily.   Yes Historical Provider, MD  gabapentin (NEURONTIN) 300 MG capsule Take 900 mg by mouth at bedtime.   Yes Historical Provider, MD  insulin regular human CONCENTRATED (HUMULIN  R) 500 UNIT/ML injection Inject 100 Units into the skin 2 (two) times daily.   Yes Historical Provider, MD  Multiple Vitamins-Minerals (PRESERVISION AREDS 2 PO) Take 2 capsules by mouth daily.   Yes Historical Provider, MD  nadolol (CORGARD) 20 MG tablet Take 20 mg by mouth daily.   Yes Historical Provider, MD  ondansetron (ZOFRAN) 8 MG tablet Take 8 mg by mouth every 8 (eight) hours as needed for nausea or vomiting.   Yes Historical Provider, MD  pantoprazole (PROTONIX) 40 MG tablet Take 40 mg by mouth 2 (two) times daily.   Yes Historical Provider, MD  sertraline (ZOLOFT) 100 MG tablet Take 100 mg by mouth daily.   Yes Historical Provider, MD    Current Facility-Administered Medications  Medication Dose Route Frequency Provider Last Rate Last Dose  . 0.9 %  sodium chloride infusion   Intravenous Continuous Clydie Braunondell A Smith, MD 100 mL/hr at 01/20/16 0032 1,000 mL at 01/20/16 0032  . folic acid (FOLVITE) tablet 1 mg  1 mg Oral Daily Rondell A Smith, MD      . gabapentin (NEURONTIN) capsule 900 mg  900 mg Oral QHS Clydie Braunondell A Smith, MD   900 mg at 01/20/16 0031  . [START ON 01/21/2016] Influenza vac split quadrivalent PF (FLUARIX) injection 0.5 mL  0.5 mL Intramuscular Tomorrow-1000 Clydie Braunondell A Smith, MD      .  insulin aspart (novoLOG) injection 0-15 Units  0-15 Units Subcutaneous TID WC Clydie Braun, MD   5 Units at 01/20/16 0640  . insulin detemir (LEVEMIR) injection 20 Units  20 Units Subcutaneous QHS Clydie Braun, MD   20 Units at 01/20/16 0031  . loratadine (CLARITIN) tablet 10 mg  10 mg Oral Daily Rondell A Katrinka Blazing, MD      . morphine 2 MG/ML injection 2 mg  2 mg Intravenous Q2H PRN Clydie Braun, MD      . nadolol (CORGARD) tablet 20 mg  20 mg Oral Daily Rondell A Katrinka Blazing, MD      . ondansetron (ZOFRAN) injection 4 mg  4 mg Intravenous Q6H PRN Rondell A Katrinka Blazing, MD      . pantoprazole (PROTONIX) EC tablet 40 mg  40 mg Oral BID Rondell A Katrinka Blazing, MD      . vitamin B-12 (CYANOCOBALAMIN) tablet 500  mcg  500 mcg Oral Daily Clydie Braun, MD        Allergies as of 01/19/2016  . (No Known Allergies)    No family history on file.  Social History   Social History  . Marital status: Married    Spouse name: N/A  . Number of children: N/A  . Years of education: N/A   Occupational History  . Not on file.   Social History Main Topics  . Smoking status: Current Every Day Smoker    Packs/day: 0.25    Types: Cigarettes  . Smokeless tobacco: Never Used  . Alcohol use No  . Drug use: No  . Sexual activity: Not on file   Other Topics Concern  . Not on file   Social History Narrative  . No narrative on file    Review of Systems: Positive = bold Gen: Denies any fever, chills, rigors, night sweats, anorexia, fatigue, weakness, malaise, involuntary weight loss, and sleep disorder CV: Denies chest pain, angina, palpitations, syncope, orthopnea, PND, peripheral edema, and claudication. Resp: Denies dyspnea, cough, sputum, wheezing, coughing up blood. GI: Described in detail in HPI.    GU : Denies urinary burning, blood in urine, urinary frequency, urinary hesitancy, nocturnal urination, and urinary incontinence. MS: Denies joint pain or swelling.  Denies muscle weakness, cramps, atrophy.  Derm: Denies rash, itching, oral ulcerations, hives, unhealing ulcers.  Psych: Denies depression, anxiety, memory loss, suicidal ideation, hallucinations,  and confusion. Heme: Denies bruising, bleeding, and enlarged lymph nodes. Neuro:  Denies any headaches, dizziness, paresthesias. Endo:  Denies any problems with DM, thyroid, adrenal function.  Physical Exam: Vital signs in last 24 hours: Temp:  [97.7 F (36.5 C)-98.4 F (36.9 C)] 97.7 F (36.5 C) (09/30 0441) Pulse Rate:  [79-90] 79 (09/30 0441) Resp:  [13-25] 20 (09/30 0441) BP: (119-147)/(74-93) 119/74 (09/30 0441) SpO2:  [92 %-100 %] 95 % (09/30 0441) Weight:  [80.1 kg (176 lb 9.6 oz)] 80.1 kg (176 lb 9.6 oz) (09/30 0005) Last BM  Date: 01/19/16 General:   Alert, somewhat cachectic-appearing, older-appearing than stated age, is in NAD Head:  Normocephalic and atraumatic. Eyes:  Faint scleral icterus bilaterally.   Conjunctiva slightly pale Ears:  Normal auditory acuity. Nose:  No deformity, discharge,  or lesions. Mouth:  No deformity or lesions.  Oropharynx pink & moist. Neck:  Supple; no masses or thyromegaly. Lungs:  Clear throughout to auscultation.   No wheezes, crackles, or rhonchi. No acute distress. Heart:  Regular rate and rhythm; no murmurs, clicks, rubs,  or gallops. Abdomen:  Soft, nontender  and nondistended. No masses, hepatosplenomegaly or hernias noted. Normal bowel sounds, without guarding, and without rebound. No appreciable ascites. Msk:  Symmetrical without gross deformities. Normal posture. Pulses:  Normal pulses noted. Extremities:  Without clubbing or edema. Neurologic:  Alert and  oriented x4; diffusely weak, otherwise grossly normal neurologically. Skin:  Scattered ecchymoses and telangiectasias, otherwise intact without significant lesions or rashes. Cervical Nodes:  No significant cervical adenopathy. Psych:  Alert and cooperative. Normal mood and affect.   Lab Results:  Recent Labs  01/19/16 2133 01/20/16 0008 01/20/16 0606  WBC 4.4  --  3.6*  HGB 12.9* 12.8* 11.7*  HCT 35.7* 36.7* 34.1*  PLT 62*  --  46*   BMET  Recent Labs  01/19/16 2133 01/20/16 0008  NA 134* 136  K 4.0 3.7  CL 105 105  CO2 20* 19*  GLUCOSE 411* 307*  BUN 11 10  CREATININE 0.64 0.55*  CALCIUM 8.8* 8.8*   LFT  Recent Labs  01/19/16 2133  PROT 6.1*  ALBUMIN 3.4*  AST 63*  ALT 64*  ALKPHOS 92  BILITOT 1.7*   PT/INR  Recent Labs  01/19/16 2133  LABPROT 15.0  INR 1.17    Studies/Results: Dg Chest 2 View  Result Date: 01/19/2016 CLINICAL DATA:  Substernal and epigastric chest pain with fatigue. Symptoms for 2-3 weeks. Shortness of breath. EXAM: CHEST  2 VIEW COMPARISON:  03/21/2006  FINDINGS: Normal heart size and pulmonary vascularity. No focal airspace disease or consolidation in the lungs. No blunting of costophrenic angles. No pneumothorax. Mediastinal contours appear intact. Degenerative changes in the spine. IMPRESSION: No active cardiopulmonary disease. Electronically Signed   By: Burman Nieves M.D.   On: 01/19/2016 21:43   Impression:  1.  Chest pain.  Cardiac work-up thus far negative.  Prior history of esophageal ulcer. 2.  Melena x 3 days.  Suspect portal gastropathy in setting of cirrhosis with thrombocytopenia. 3.  Mild anemia. 4.  Cirrhosis.  No alcohol.  Suspected NASH.  Plan:  1.  De-escalate diet to full liquids. 2.  Pantoprazole 40 mg IV Q12 hours. 3.  Endoscopy, non-emergent, for further evaluation.  If anesthesia and pre-procedural platelets can be arranged for tomorrow morning, then we'll do the procedure then; otherwise, unless patient has hemodynamic decompensation, we'll do the procedure Monday morning.   4.  [facility] will follow.   LOS: 0 days   Cooper Stamp M  01/20/2016, 9:32 AM  Pager [phone] If no answer or after 5 PM call [phone]

## 2016-01-21 ENCOUNTER — Encounter (HOSPITAL_COMMUNITY)
Admission: EM | Disposition: A | Discharge status: Home / Self Care | Payer: Self-pay | Source: Home / Self Care | Attending: Internal Medicine

## 2016-01-21 ENCOUNTER — Encounter (HOSPITAL_COMMUNITY): Payer: Self-pay | Admitting: Certified Registered"

## 2016-01-21 ENCOUNTER — Inpatient Hospital Stay (HOSPITAL_COMMUNITY): Payer: Non-veteran care | Admitting: Certified Registered Nurse Anesthetist

## 2016-01-21 DIAGNOSIS — K210 Gastro-esophageal reflux disease with esophagitis: Secondary | ICD-10-CM

## 2016-01-21 HISTORY — PX: ESOPHAGOGASTRODUODENOSCOPY (EGD) WITH PROPOFOL: SHX5813

## 2016-01-21 LAB — ECHOCARDIOGRAM COMPLETE
E/e' ratio: 13.79
EWDT: 187 ms
FS: 19 % — AB (ref 28–44)
Height: 70 in
IVS/LV PW RATIO, ED: 0.86
LA diam index: 2.17 cm/m2
LA vol index: 39.6 mL/m2
LASIZE: 43 mm
LAVOL: 78.4 mL
LAVOLA4C: 58.7 mL
LEFT ATRIUM END SYS DIAM: 43 mm
LV E/e'average: 13.79
LV PW d: 9.92 mm — AB (ref 0.6–1.1)
LV TDI E'LATERAL: 7.18
LV TDI E'MEDIAL: 6.2
LVEEMED: 13.79
LVELAT: 7.18 cm/s
LVOT area: 3.8 cm2
LVOTD: 22 mm
Lateral S' vel: 12.5 cm/s
MV Dec: 187
MV pk E vel: 99 m/s
MVPG: 4 mmHg
MVPKAVEL: 75.2 m/s
RV TAPSE: 18.2 mm
Weight: 2825.6 oz

## 2016-01-21 LAB — GLUCOSE, CAPILLARY
GLUCOSE-CAPILLARY: 181 mg/dL — AB (ref 65–99)
GLUCOSE-CAPILLARY: 376 mg/dL — AB (ref 65–99)
GLUCOSE-CAPILLARY: 213 mg/dL — AB (ref 65–99)
Glucose-Capillary: 182 mg/dL — ABNORMAL HIGH (ref 65–99)

## 2016-01-21 LAB — CBC WITH DIFFERENTIAL/PLATELET
BASOS PCT: 1 %
Basophils Absolute: 0 10*3/uL (ref 0.0–0.1)
EOS ABS: 0.2 10*3/uL (ref 0.0–0.7)
EOS PCT: 4 %
HCT: 35 % — ABNORMAL LOW (ref 39.0–52.0)
HEMOGLOBIN: 12.1 g/dL — AB (ref 13.0–17.0)
LYMPHS ABS: 1.7 10*3/uL (ref 0.7–4.0)
Lymphocytes Relative: 42 %
MCH: 30.9 pg (ref 26.0–34.0)
MCHC: 34.6 g/dL (ref 30.0–36.0)
MCV: 89.5 fL (ref 78.0–100.0)
MONOS PCT: 7 %
Monocytes Absolute: 0.3 10*3/uL (ref 0.1–1.0)
Neutro Abs: 1.8 10*3/uL (ref 1.7–7.7)
Neutrophils Relative %: 46 %
PLATELETS: 50 10*3/uL — AB (ref 150–400)
RBC: 3.91 MIL/uL — ABNORMAL LOW (ref 4.22–5.81)
RDW: 13.5 % (ref 11.5–15.5)
WBC: 4 10*3/uL (ref 4.0–10.5)

## 2016-01-21 LAB — BASIC METABOLIC PANEL
Anion gap: 7 (ref 5–15)
BUN: 8 mg/dL (ref 6–20)
CALCIUM: 8.7 mg/dL — AB (ref 8.9–10.3)
CO2: 22 mmol/L (ref 22–32)
CREATININE: 0.53 mg/dL — AB (ref 0.61–1.24)
Chloride: 108 mmol/L (ref 101–111)
Glucose, Bld: 180 mg/dL — ABNORMAL HIGH (ref 65–99)
Potassium: 3.4 mmol/L — ABNORMAL LOW (ref 3.5–5.1)
SODIUM: 137 mmol/L (ref 135–145)

## 2016-01-21 LAB — CALCIUM, IONIZED: CALCIUM, IONIZED, SERUM: 4.9 mg/dL (ref 4.5–5.6)

## 2016-01-21 LAB — HEMOGLOBIN A1C
HEMOGLOBIN A1C: 14.4 % — AB (ref 4.8–5.6)
MEAN PLASMA GLUCOSE: 367 mg/dL

## 2016-01-21 SURGERY — ESOPHAGOGASTRODUODENOSCOPY (EGD) WITH PROPOFOL
Anesthesia: Monitor Anesthesia Care | Laterality: Left

## 2016-01-21 MED ORDER — SODIUM CHLORIDE 0.9 % IV SOLN: INTRAVENOUS | Status: DC

## 2016-01-21 MED ORDER — BUTAMBEN-TETRACAINE-BENZOCAINE 2-2-14 % EX AERO
INHALATION_SPRAY | CUTANEOUS | Status: DC | PRN
  Administered 2016-01-21: 2 via TOPICAL

## 2016-01-21 MED ORDER — INSULIN DETEMIR 100 UNIT/ML ~~LOC~~ SOLN
25.0000 [IU] | Freq: Every day | SUBCUTANEOUS | Status: DC
  Administered 2016-01-21: 25 [IU] via SUBCUTANEOUS
  Filled 2016-01-21 (×2): qty 0.25

## 2016-01-21 MED ORDER — PANTOPRAZOLE SODIUM 40 MG PO TBEC
40.0000 mg | DELAYED_RELEASE_TABLET | Freq: Two times a day (BID) | ORAL | Status: DC
  Administered 2016-01-21 – 2016-01-22 (×4): 40 mg via ORAL
  Filled 2016-01-21 (×4): qty 1

## 2016-01-21 MED ORDER — PROPOFOL 500 MG/50ML IV EMUL
INTRAVENOUS | Status: DC | PRN
  Administered 2016-01-21: 200 ug/kg/min via INTRAVENOUS

## 2016-01-21 MED ORDER — LIDOCAINE HCL (CARDIAC) 20 MG/ML IV SOLN
INTRAVENOUS | Status: DC | PRN
  Administered 2016-01-21: 60 mg via INTRAVENOUS

## 2016-01-21 MED ORDER — PROPOFOL 10 MG/ML IV BOLUS
INTRAVENOUS | Status: DC | PRN
  Administered 2016-01-21: 30 mg via INTRAVENOUS

## 2016-01-21 NOTE — Progress Notes (Addendum)
PROGRESS NOTE    [name]  ZOX:[id number] DOB: 1956-01-08 DOA: 01/19/2016 PCP: Pcp Not In System     Brief Narrative:  Wayne Benson is a 60 y.o. male with medical history significant of cirrhosis with esophageal varices, PUD, GI bleed, DVT s/p IVC, thrombocytopenia, tobacco abuse; who presents with a 2-3 week history of chest pain. Symptoms had occurred intermittently and are located substernal to epigastric region. Today however symptoms were more constant. Describes pain more so as a pressure sensation and rates it a 5 out of 10 on the pain scale. Symptoms worse with exertion. Associated symptoms include some mild shortness of breath and fatigue. He has had a lot of social stressors going on for the last few weeks and therefore has not been consistently taking any of his medications. The last 2 days or so he's also noticed that his stools are black and tarry in nature. Patient notes that the symptoms feel like it could possibly be acid reflux. He normally follows at the [facility] hospital and [place].  Previously, he had issues with bleeding and was found to have esophageal varices. He notes that he does smoke anywhere from 5-9 cigarettes per day.     Assessment & Plan:   Principal Problem:   Melena Active Problems:   Chest pain   Hyperglycemia   Diabetes (HCC)   Esophageal varices in cirrhosis (HCC)   Anemia   Tobacco abuse   GERD (gastroesophageal reflux disease)   Liver cirrhosis secondary to NASH (HCC)   GI bleeding  Melena secondary to portal hypertensive gastropathy  Liver cirrhosis (secondary to NASH) with esophageal varices - Guaiac positive stool in ED  - Previous history of PUD, esophageal varices (2016 at [facility]) and patient noncompliant with medications - Continue folic acid, vit B12  - Monitor CBC  - S/p EGD 10/1: grade II esophageal varices, grade A reflux esophagitis, portal hypertensive gastropathy  - Pantoprazole 40 mg po bid x 6 weeks, then 40 mg po qd  indefinitely. - Nadolol 20 mg po qd indefinitely. - Needs to follow up with primary gastroenterologist in [place]  Chest pain - Heart score = 4 - Trend troponins x 3 negative  - Repeat EKG: NSR, no ST-T changes  - Echocardiogram with reduced EF and diffuse hypokinesis  - No previous echo to compare in scanned doc or care everywhere  - Holding daily baby aspirin due to melena and gastropathy  - Consult cardiology today  Chronic thrombocytopenia - Continue to monitor  Diabetes mellitus type 2, uncontrolled, with neuropathy - Has not been compliant with insulin at home - Ha1c 14.4  - Levemir 30 units nightly  - Add novolog meal coverage 4u TID with meals  - SSI  - Monitor electrolytes - Continue gabapentin  - Needs to follow-up with his PCP/Endocrinologist regarding his U-500 Insulin at time of discharge - Appreciate diabetes coordinator   Tobacco abuse - Counseled on the need of cessation of tobacco abuse    DVT prophylaxis: SCD. IVC in place  Code Status: Full Family Communication: no family present Disposition Plan: will consult cardiology due to echo result with diffuse hypokinesis and chest pain.   Consultants:   GI   Cardiology   Procedures:   EGD 10/1  - Grade II esophageal varices. - LA Grade A reflux esophagitis. - Portal hypertensive gastropathy. - Normal duodenal bulb, first portion of the duodenum and second portion of the duodenum. - Overall suspect melena might be from friable portal gastropathy. -  Return patient to hospital ward for ongoing care. - Continue present medications. - Pantoprazole 40 mg po bid x 6 weeks, then 40 mg po qd indefinitely. - Nadolol 20 mg po qd indefinitely. - Full liquid diet today. [facility] will sign-off; no further GI interventions planned; patient should follow-up with his primary gastroenterologist in [place]; please call with any questions; thank you for the consultation.   Antimicrobials:   None      Subjective: Patient doing well today. He still has very minimal chest pain, which has improved since admission but not completely resolved. He is tolerating meals, no dizziness, shortness of breath, nausea, vomiting. Not sure if he had a BM yesterday.    Objective: Vitals:   01/21/16 0851 01/21/16 1230 01/21/16 2022 01/22/16 0508  BP: 120/71 119/75 115/65 115/83  Pulse: 67 64 80 71  Resp: (!) 22 20 20 20   Temp:  97.6 F (36.4 C) 98.2 F (36.8 C) 97.5 F (36.4 C)  TempSrc:  Oral Oral Oral  SpO2: 96% 100% 98% 98%  Weight:    79.4 kg (175 lb)  Height:        Intake/Output Summary (Last 24 hours) at 01/22/16 0813 Last data filed at 01/22/16 0324  Gross per 24 hour  Intake              240 ml  Output             1000 ml  Net             -760 ml   Filed Weights   01/20/16 0005 01/21/16 0748 01/22/16 0508  Weight: 80.1 kg (176 lb 9.6 oz) 78.5 kg (173 lb) 79.4 kg (175 lb)    Examination:  General exam: Appears calm and comfortable  Respiratory system: Clear to auscultation. Respiratory effort normal. Cardiovascular system: S1 & S2 heard, RRR. No JVD, murmurs, rubs, gallops or clicks. No pedal edema. Gastrointestinal system: Abdomen is nondistended, soft and nontender. No organomegaly or masses felt. Normal bowel sounds heard. Central nervous system: Alert and oriented. No focal neurological deficits. Extremities: Symmetric 5 x 5 power. Skin: No rashes, lesions or ulcers Psychiatry: Judgement and insight appear normal. Mood & affect appropriate.   Data Reviewed: I have personally reviewed following labs and imaging studies  CBC:  Recent Labs Lab 01/19/16 2133 01/20/16 0008 01/20/16 0606 01/21/16 0334 01/22/16 0407  WBC 4.4  --  3.6* 4.0 4.3  NEUTROABS 2.7  --  1.9 1.8 2.4  HGB 12.9* 12.8* 11.7* 12.1* 12.5*  HCT 35.7* 36.7* 34.1* 35.0* 36.4*  MCV 87.1  --  88.3 89.5 89.4  PLT 62*  --  46* 50* 67*   Basic Metabolic Panel:  Recent Labs Lab 01/19/16 2133  01/20/16 0008 01/21/16 0334 01/22/16 0407  NA 134* 136 137 137  K 4.0 3.7 3.4* 3.6  CL 105 105 108 108  CO2 20* 19* 22 22  GLUCOSE 411* 307* 180* 244*  BUN 11 10 8 10   CREATININE 0.64 0.55* 0.53* 0.57*  CALCIUM 8.8* 8.8* 8.7* 8.8*   GFR: Estimated Creatinine Clearance: 101.4 mL/min (by C-G formula based on SCr of 0.57 mg/dL (L)). Liver Function Tests:  Recent Labs Lab 01/19/16 2133  AST 63*  ALT 64*  ALKPHOS 92  BILITOT 1.7*  PROT 6.1*  ALBUMIN 3.4*   No results for input(s): LIPASE, AMYLASE in the last 168 hours. No results for input(s): AMMONIA in the last 168 hours. Coagulation Profile:  Recent Labs Lab 01/19/16  2133  INR 1.17   Cardiac Enzymes:  Recent Labs Lab 01/20/16 0008 01/20/16 0249 01/20/16 0606  TROPONINI <0.03 <0.03 <0.03   BNP (last 3 results) No results for input(s): PROBNP in the last 8760 hours. HbA1C:  Recent Labs  01/20/16 0825  HGBA1C 14.4*   CBG:  Recent Labs Lab 01/21/16 0645 01/21/16 1133 01/21/16 1640 01/21/16 2141 01/22/16 0740  GLUCAP 181* 182* 376* 213* 199*   Lipid Profile: No results for input(s): CHOL, HDL, LDLCALC, TRIG, CHOLHDL, LDLDIRECT in the last 72 hours. Thyroid Function Tests: No results for input(s): TSH, T4TOTAL, FREET4, T3FREE, THYROIDAB in the last 72 hours. Anemia Panel: No results for input(s): VITAMINB12, FOLATE, FERRITIN, TIBC, IRON, RETICCTPCT in the last 72 hours. Sepsis Labs: No results for input(s): PROCALCITON, LATICACIDVEN in the last 168 hours.  No results found for this or any previous visit (from the past 240 hour(s)).       Radiology Studies: No results found.  cxr 9/29 reviewed personally - mild pulmonary congestion, no focal consolidation ekg 9/30 reviewed personally - NSR, normal axis, no ST-T changes     Scheduled Meds: . folic acid  1 mg Oral Daily  . gabapentin  900 mg Oral QHS  . Influenza vac split quadrivalent PF  0.5 mL Intramuscular Tomorrow-1000  . insulin  aspart  0-15 Units Subcutaneous TID WC  . insulin aspart  4 Units Subcutaneous TID WC  . insulin detemir  30 Units Subcutaneous QHS  . loratadine  10 mg Oral Daily  . nadolol  20 mg Oral Daily  . pantoprazole  40 mg Oral BID AC  . vitamin B-12  500 mcg Oral Daily   Continuous Infusions:     LOS: 2 days    Time spent: 25 minutes    Noralee StainJennifer Shevette Bess, DO [facility] Pager [phone]  If 7PM-7AM, please contact night-coverage [email or web address] Password [email or web address] 01/22/2016, 8:13 AM

## 2016-01-21 NOTE — Anesthesia Postprocedure Evaluation (Signed)
Anesthesia Post Note  Patient: Wayne Benson  Procedure(s) Performed: Procedure(s) (LRB): ESOPHAGOGASTRODUODENOSCOPY (EGD) WITH PROPOFOL (Left)  Patient location during evaluation: PACU Anesthesia Type: MAC Level of consciousness: awake and alert Vital Signs Assessment: post-procedure vital signs reviewed and stable Respiratory status: spontaneous breathing, nonlabored ventilation, respiratory function stable and patient connected to nasal cannula oxygen Cardiovascular status: stable and blood pressure returned to baseline Anesthetic complications: no    Last Vitals:  Vitals:   01/21/16 0839 01/21/16 0851  BP: 102/84 120/71  Pulse: 66 67  Resp: 20 (!) 22  Temp:      Last Pain:  Vitals:   01/21/16 0830  TempSrc: Oral  PainSc:                  Shelton SilvasKevin D  Julson

## 2016-01-21 NOTE — Progress Notes (Signed)
Pt tolerated Full liquid and advanced to soft pt tolerated soft for dinner.

## 2016-01-21 NOTE — Interval H&P Note (Signed)
History and Physical Interval Note:  01/21/2016 8:06 AM  Wayne Benson  has presented today for surgery, with the diagnosis of melena, cirrhosis, thrombocytopenia  The various methods of treatment have been discussed with the patient and family. After consideration of risks, benefits and other options for treatment, the patient has consented to  Procedure(s): ESOPHAGOGASTRODUODENOSCOPY (EGD) WITH PROPOFOL (Left) as a surgical intervention .  The patient's history has been reviewed, patient examined, no change in status, stable for surgery.  I have reviewed the patient's chart and labs.  Questions were answered to the patient's satisfaction.     Freddy JakschUTLAW,Adison Jerger M

## 2016-01-21 NOTE — Op Note (Signed)
[facility] Patient Name: Wayne Benson Procedure Date : 01/21/2016 MRN: [id number] Attending MD: Willis Modena , MD Date of Birth: [birth year] CSN: [id number] Age: 60 Admit Type: Inpatient Procedure:                Upper GI endoscopy Indications:              Acute post hemorrhagic anemia, Melena, cirrhosis Providers:                Willis Modena, MD, Jacquiline Doe, RN, Arlee Muslim Tech., Technician, Einar Crow, CRNA Referring MD:              Medicines:                Propofol per Anesthesia, 2 units pheresed platelets                            pre-procedure Complications:            No immediate complications. Estimated Blood Loss:     Estimated blood loss: none. Procedure:                Pre-Anesthesia Assessment:                           - Prior to the procedure, a History and Physical                            was performed, and patient medications and                            allergies were reviewed. The patient's tolerance of                            previous anesthesia was also reviewed. The risks                            and benefits of the procedure and the sedation                            options and risks were discussed with the patient.                            All questions were answered, and informed consent                            was obtained. Prior Anticoagulants: The patient has                            taken no previous anticoagulant or antiplatelet                            agents. ASA Grade Assessment: III - A patient with  severe systemic disease. After reviewing the risks                            and benefits, the patient was deemed in                            satisfactory condition to undergo the procedure.                           After obtaining informed consent, the endoscope was                            passed under direct vision. Throughout the                procedure, the patient's blood pressure, pulse, and                            oxygen saturations were monitored continuously. The                            [id number] ([id number] scope was introduced through the                            mouth, and advanced to the second part of duodenum.                            The upper GI endoscopy was accomplished without                            difficulty. The patient tolerated the procedure                            well. Scope In: Scope Out: Findings:      Grade II varices were found in the lower third of the esophagus. No       bleeding stigmata.      LA Grade A (one or more mucosal breaks less than 5 mm, not extending       between tops of 2 mucosal folds) esophagitis was found.      The exam of the esophagus was otherwise normal.      Moderate portal hypertensive gastropathy was found in the entire       examined stomach. This was a bit      The exam of the stomach was otherwise normal.      The duodenal bulb, first portion of the duodenum and second portion of       the duodenum were normal.      No old or fresh blood was seen to the extent of our examination. Impression:               - Grade II esophageal varices.                           - LA Grade A reflux esophagitis.                           -  Portal hypertensive gastropathy.                           - Normal duodenal bulb, first portion of the                            duodenum and second portion of the duodenum.                           - Overall suspect melena might be from friable                            portal gastropathy. Moderate Sedation:      None Recommendation:           - Return patient to hospital ward for ongoing care.                           - Continue present medications.                           - Pantoprazole 40 mg po bid x 6 weeks, then 40 mg                            po qd indefinitely.                           - Nadolol 20 mg po  qd indefinitely.                           - Full liquid diet today.                           [facility] will sign-off; no further GI                            interventions planned; patient should follow-up                            with his primary gastroenterologist in                            [place]; please call with any questions; thank                            you for the consultation. Procedure Code(s):        --- Professional ---                           [phone], Esophagogastroduodenoscopy, flexible,                            transoral; diagnostic, including collection of                            specimen(s) by brushing or washing, when performed                            (  separate procedure) Diagnosis Code(s):        --- Professional ---                           I85.00, Esophageal varices without bleeding                           K21.0, Gastro-esophageal reflux disease with                            esophagitis                           K76.6, Portal hypertension                           K31.89, Other diseases of stomach and duodenum                           D62, Acute posthemorrhagic anemia                           K92.1, Melena (includes Hematochezia) CPT copyright 2016 [employer]. All rights reserved. The codes documented in this report are preliminary and upon coder review may  be revised to meet current compliance requirements. Willis Modena, MD 01/21/2016 8:34:53 AM This report has been signed electronically. Number of Addenda: 0

## 2016-01-21 NOTE — Anesthesia Preprocedure Evaluation (Addendum)
Anesthesia Evaluation  Patient identified by MRN, date of birth, ID band Patient awake    Reviewed: Allergy & Precautions, NPO status , Patient's Chart, lab work & pertinent test results  Airway Mallampati: II  TM Distance: >3 FB Neck ROM: Full    Dental  (+) Teeth Intact, Poor Dentition, Dental Advisory Given   Pulmonary Current Smoker,    breath sounds clear to auscultation       Cardiovascular negative cardio ROS   Rhythm:Regular Rate:Normal     Neuro/Psych  Neuromuscular disease negative psych ROS   GI/Hepatic Neg liver ROS, GERD  Medicated,(+) Cirrhosis   Esophageal Varices    ,   Endo/Other  diabetes, Type 2, Insulin Dependent  Renal/GU negative Renal ROS  negative genitourinary   Musculoskeletal negative musculoskeletal ROS (+)   Abdominal   Peds negative pediatric ROS (+)  Hematology negative hematology ROS (+)   Anesthesia Other Findings   Reproductive/Obstetrics negative OB ROS                            Anesthesia Physical Anesthesia Plan  ASA: III  Anesthesia Plan: MAC   Post-op Pain Management:    Induction: Intravenous  Airway Management Planned: Natural Airway  Additional Equipment:   Intra-op Plan:   Post-operative Plan:   Informed Consent: I have reviewed the patients History and Physical, chart, labs and discussed the procedure including the risks, benefits and alternatives for the proposed anesthesia with the patient or authorized representative who has indicated his/her understanding and acceptance.     Plan Discussed with: CRNA  Anesthesia Plan Comments:         Anesthesia Quick Evaluation

## 2016-01-21 NOTE — Transfer of Care (Signed)
Immediate Anesthesia Transfer of Care Note  Patient: Wayne Benson  Procedure(s) Performed: Procedure(s): ESOPHAGOGASTRODUODENOSCOPY (EGD) WITH PROPOFOL (Left)  Patient Location: Endoscopy Unit  Anesthesia Type:MAC  Level of Consciousness: sedated and responds to stimulation  Airway & Oxygen Therapy: Patient Spontanous Breathing and Patient connected to nasal cannula oxygen  Post-op Assessment: Report given to RN, Post -op Vital signs reviewed and stable and Patient moving all extremities  Post vital signs: Reviewed and stable  Last Vitals:  Vitals:   01/21/16 0641 01/21/16 0747  BP: 122/76 (!) 142/87  Pulse: 72 71  Resp: 18 15  Temp: 36.7 C 36.6 C    Last Pain:  Vitals:   01/21/16 0807  TempSrc:   PainSc: 3          Complications: No apparent anesthesia complications

## 2016-01-21 NOTE — H&P (View-Only) (Signed)
[facility] Consultation Note  Referring Provider: Dr. Rondell Smith ([facility]) Primary Care Physician:  Pcp Not In System  Reason for Consultation:  melena  HPI: Wayne Benson is a 60 y.o. male whom we've been asked to see for melena.  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  Patient denies alcohol use.  He presents to ED with chest pain; cardiac evaluation thus far negative.  We were asked to see for melena.  For the past 2-3 days, patient has had 1-2 episodes of black stools.  Prior history last year of heavy NSAIDs, but denies any since that time.  Has no abdominal pain.  No hematemesis.  No hematochezia.   Past Medical History:  Diagnosis Date  . Diabetes mellitus without complication (HCC)   . DVT (deep vein thrombosis) in pregnancy   . Esophageal varices (HCC)   . Hypercholesteremia   . Non-alcoholic cirrhosis (HCC)   . Peripheral neuropathy (HCC)   . Thrombocytopenia (HCC)     Past Surgical History:  Procedure Laterality Date  . FINGER SURGERY    . IVC FILTER PLACEMENT ([facility] HX)    . US GUIDED PARACENTESIS ([facility] HX)      Prior to Admission medications   Medication Sig Start Date End Date Taking? Authorizing Provider  acetaminophen (TYLENOL) 500 MG tablet Take 1,500 mg by mouth every 6 (six) hours as needed for mild pain.   Yes Historical Provider, MD  aspirin 325 MG tablet Take 325 mg by mouth daily.   Yes Historical Provider, MD  cetirizine (ZYRTEC) 10 MG tablet Take 10 mg by mouth daily.   Yes Historical Provider, MD  cyanocobalamin 500 MCG tablet Take 500 mcg by mouth daily.   Yes Historical Provider, MD  folic acid (FOLVITE) 1 MG tablet Take 1 mg by mouth daily.   Yes Historical Provider, MD  gabapentin (NEURONTIN) 300 MG capsule Take 900 mg by mouth at bedtime.   Yes Historical Provider, MD  insulin regular human CONCENTRATED (HUMULIN  R) 500 UNIT/ML injection Inject 100 Units into the skin 2 (two) times daily.   Yes Historical Provider, MD  Multiple Vitamins-Minerals (PRESERVISION AREDS 2 PO) Take 2 capsules by mouth daily.   Yes Historical Provider, MD  nadolol (CORGARD) 20 MG tablet Take 20 mg by mouth daily.   Yes Historical Provider, MD  ondansetron (ZOFRAN) 8 MG tablet Take 8 mg by mouth every 8 (eight) hours as needed for nausea or vomiting.   Yes Historical Provider, MD  pantoprazole (PROTONIX) 40 MG tablet Take 40 mg by mouth 2 (two) times daily.   Yes Historical Provider, MD  sertraline (ZOLOFT) 100 MG tablet Take 100 mg by mouth daily.   Yes Historical Provider, MD    Current Facility-Administered Medications  Medication Dose Route Frequency Provider Last Rate Last Dose  . 0.9 %  sodium chloride infusion   Intravenous Continuous Rondell A Smith, MD 100 mL/hr at 01/20/16 0032 1,000 mL at 01/20/16 0032  . folic acid (FOLVITE) tablet 1 mg  1 mg Oral Daily Rondell A Smith, MD      . gabapentin (NEURONTIN) capsule 900 mg  900 mg Oral QHS Rondell A Smith, MD   900 mg at 01/20/16 0031  . [START ON 01/21/2016] Influenza vac split quadrivalent PF (FLUARIX) injection 0.5 mL  0.5 mL Intramuscular Tomorrow-1000 Rondell A Smith, MD      .   insulin aspart (novoLOG) injection 0-15 Units  0-15 Units Subcutaneous TID WC Rondell A Smith, MD   5 Units at 01/20/16 0640  . insulin detemir (LEVEMIR) injection 20 Units  20 Units Subcutaneous QHS Rondell A Smith, MD   20 Units at 01/20/16 0031  . loratadine (CLARITIN) tablet 10 mg  10 mg Oral Daily Rondell A Smith, MD      . morphine 2 MG/ML injection 2 mg  2 mg Intravenous Q2H PRN Rondell A Smith, MD      . nadolol (CORGARD) tablet 20 mg  20 mg Oral Daily Rondell A Smith, MD      . ondansetron (ZOFRAN) injection 4 mg  4 mg Intravenous Q6H PRN Rondell A Smith, MD      . pantoprazole (PROTONIX) EC tablet 40 mg  40 mg Oral BID Rondell A Smith, MD      . vitamin B-12 (CYANOCOBALAMIN) tablet 500  mcg  500 mcg Oral Daily Rondell A Smith, MD        Allergies as of 01/19/2016  . (No Known Allergies)    No family history on file.  Social History   Social History  . Marital status: Married    Spouse name: N/A  . Number of children: N/A  . Years of education: N/A   Occupational History  . Not on file.   Social History Main Topics  . Smoking status: Current Every Day Smoker    Packs/day: 0.25    Types: Cigarettes  . Smokeless tobacco: Never Used  . Alcohol use No  . Drug use: No  . Sexual activity: Not on file   Other Topics Concern  . Not on file   Social History Narrative  . No narrative on file    Review of Systems: Positive = bold Gen: Denies any fever, chills, rigors, night sweats, anorexia, fatigue, weakness, malaise, involuntary weight loss, and sleep disorder CV: Denies chest pain, angina, palpitations, syncope, orthopnea, PND, peripheral edema, and claudication. Resp: Denies dyspnea, cough, sputum, wheezing, coughing up blood. GI: Described in detail in HPI.    GU : Denies urinary burning, blood in urine, urinary frequency, urinary hesitancy, nocturnal urination, and urinary incontinence. MS: Denies joint pain or swelling.  Denies muscle weakness, cramps, atrophy.  Derm: Denies rash, itching, oral ulcerations, hives, unhealing ulcers.  Psych: Denies depression, anxiety, memory loss, suicidal ideation, hallucinations,  and confusion. Heme: Denies bruising, bleeding, and enlarged lymph nodes. Neuro:  Denies any headaches, dizziness, paresthesias. Endo:  Denies any problems with DM, thyroid, adrenal function.  Physical Exam: Vital signs in last 24 hours: Temp:  [97.7 F (36.5 C)-98.4 F (36.9 C)] 97.7 F (36.5 C) (09/30 0441) Pulse Rate:  [79-90] 79 (09/30 0441) Resp:  [13-25] 20 (09/30 0441) BP: (119-147)/(74-93) 119/74 (09/30 0441) SpO2:  [92 %-100 %] 95 % (09/30 0441) Weight:  [80.1 kg (176 lb 9.6 oz)] 80.1 kg (176 lb 9.6 oz) (09/30 0005) Last BM  Date: 01/19/16 General:   Alert, somewhat cachectic-appearing, older-appearing than stated age, is in NAD Head:  Normocephalic and atraumatic. Eyes:  Faint scleral icterus bilaterally.   Conjunctiva slightly pale Ears:  Normal auditory acuity. Nose:  No deformity, discharge,  or lesions. Mouth:  No deformity or lesions.  Oropharynx pink & moist. Neck:  Supple; no masses or thyromegaly. Lungs:  Clear throughout to auscultation.   No wheezes, crackles, or rhonchi. No acute distress. Heart:  Regular rate and rhythm; no murmurs, clicks, rubs,  or gallops. Abdomen:  Soft, nontender   and nondistended. No masses, hepatosplenomegaly or hernias noted. Normal bowel sounds, without guarding, and without rebound. No appreciable ascites. Msk:  Symmetrical without gross deformities. Normal posture. Pulses:  Normal pulses noted. Extremities:  Without clubbing or edema. Neurologic:  Alert and  oriented x4; diffusely weak, otherwise grossly normal neurologically. Skin:  Scattered ecchymoses and telangiectasias, otherwise intact without significant lesions or rashes. Cervical Nodes:  No significant cervical adenopathy. Psych:  Alert and cooperative. Normal mood and affect.   Lab Results:  Recent Labs  01/19/16 2133 01/20/16 0008 01/20/16 0606  WBC 4.4  --  3.6*  HGB 12.9* 12.8* 11.7*  HCT 35.7* 36.7* 34.1*  PLT 62*  --  46*   BMET  Recent Labs  01/19/16 2133 01/20/16 0008  NA 134* 136  K 4.0 3.7  CL 105 105  CO2 20* 19*  GLUCOSE 411* 307*  BUN 11 10  CREATININE 0.64 0.55*  CALCIUM 8.8* 8.8*   LFT  Recent Labs  01/19/16 2133  PROT 6.1*  ALBUMIN 3.4*  AST 63*  ALT 64*  ALKPHOS 92  BILITOT 1.7*   PT/INR  Recent Labs  01/19/16 2133  LABPROT 15.0  INR 1.17    Studies/Results: Dg Chest 2 View  Result Date: 01/19/2016 CLINICAL DATA:  Substernal and epigastric chest pain with fatigue. Symptoms for 2-3 weeks. Shortness of breath. EXAM: CHEST  2 VIEW COMPARISON:  03/21/2006  FINDINGS: Normal heart size and pulmonary vascularity. No focal airspace disease or consolidation in the lungs. No blunting of costophrenic angles. No pneumothorax. Mediastinal contours appear intact. Degenerative changes in the spine. IMPRESSION: No active cardiopulmonary disease. Electronically Signed   By: Carlos Heber  Stevens M.D.   On: 01/19/2016 21:43   Impression:  1.  Chest pain.  Cardiac work-up thus far negative.  Prior history of esophageal ulcer. 2.  Melena x 3 days.  Suspect portal gastropathy in setting of cirrhosis with thrombocytopenia. 3.  Mild anemia. 4.  Cirrhosis.  No alcohol.  Suspected NASH.  Plan:  1.  De-escalate diet to full liquids. 2.  Pantoprazole 40 mg IV Q12 hours. 3.  Endoscopy, non-emergent, for further evaluation.  If anesthesia and pre-procedural platelets can be arranged for tomorrow morning, then we'll do the procedure then; otherwise, unless patient has hemodynamic decompensation, we'll do the procedure Monday morning.   4.  [facility] will follow.   LOS: 0 days   Azlan Hanway M  01/20/2016, 9:32 AM  Pager [phone] If no answer or after 5 PM call [phone]  

## 2016-01-22 ENCOUNTER — Encounter (HOSPITAL_COMMUNITY): Payer: Self-pay | Admitting: Gastroenterology

## 2016-01-22 LAB — CBC WITH DIFFERENTIAL/PLATELET
Basophils Absolute: 0 10*3/uL (ref 0.0–0.1)
Basophils Relative: 1 %
Eosinophils Absolute: 0.1 10*3/uL (ref 0.0–0.7)
Eosinophils Relative: 3 %
HEMATOCRIT: 36.4 % — AB (ref 39.0–52.0)
Hemoglobin: 12.5 g/dL — ABNORMAL LOW (ref 13.0–17.0)
LYMPHS ABS: 1.5 10*3/uL (ref 0.7–4.0)
LYMPHS PCT: 36 %
MCH: 30.7 pg (ref 26.0–34.0)
MCHC: 34.3 g/dL (ref 30.0–36.0)
MCV: 89.4 fL (ref 78.0–100.0)
MONO ABS: 0.3 10*3/uL (ref 0.1–1.0)
MONOS PCT: 7 %
NEUTROS ABS: 2.4 10*3/uL (ref 1.7–7.7)
Neutrophils Relative %: 55 %
Platelets: 67 10*3/uL — ABNORMAL LOW (ref 150–400)
RBC: 4.07 MIL/uL — ABNORMAL LOW (ref 4.22–5.81)
RDW: 13.5 % (ref 11.5–15.5)
WBC: 4.3 10*3/uL (ref 4.0–10.5)

## 2016-01-22 LAB — GLUCOSE, CAPILLARY
GLUCOSE-CAPILLARY: 199 mg/dL — AB (ref 65–99)
Glucose-Capillary: 266 mg/dL — ABNORMAL HIGH (ref 65–99)
Glucose-Capillary: 243 mg/dL — ABNORMAL HIGH (ref 65–99)

## 2016-01-22 LAB — PREPARE PLATELET PHERESIS
UNIT DIVISION: 0
UNIT DIVISION: 0

## 2016-01-22 LAB — BASIC METABOLIC PANEL
Anion gap: 7 (ref 5–15)
BUN: 10 mg/dL (ref 6–20)
CHLORIDE: 108 mmol/L (ref 101–111)
CO2: 22 mmol/L (ref 22–32)
CREATININE: 0.57 mg/dL — AB (ref 0.61–1.24)
Calcium: 8.8 mg/dL — ABNORMAL LOW (ref 8.9–10.3)
GFR calc Af Amer: >60 mL/min (ref >60)
GFR calc non Af Amer: >60 mL/min (ref >60)
Glucose, Bld: 244 mg/dL — ABNORMAL HIGH (ref 65–99)
Potassium: 3.6 mmol/L (ref 3.5–5.1)
SODIUM: 137 mmol/L (ref 135–145)

## 2016-01-22 MED ORDER — INSULIN ASPART 100 UNIT/ML ~~LOC~~ SOLN
4.0000 [IU] | Freq: Three times a day (TID) | SUBCUTANEOUS | Status: DC
  Administered 2016-01-22 (×2): 4 [IU] via SUBCUTANEOUS

## 2016-01-22 MED ORDER — INSULIN DETEMIR 100 UNIT/ML ~~LOC~~ SOLN
30.0000 [IU] | Freq: Every day | SUBCUTANEOUS | Status: DC
  Filled 2016-01-22: qty 0.3

## 2016-01-22 NOTE — Progress Notes (Addendum)
Inpatient Diabetes Program Recommendations  AACE/ADA: New Consensus Statement on Inpatient Glycemic Control (2015)  Target Ranges:  Prepandial:   less than 140 mg/dL      Peak postprandial:   less than 180 mg/dL (1-2 hours)      Critically ill patients:  140 - 180 mg/dL   Results for Wayne Benson, Wayne Benson (MRN [id number]) as of 01/22/2016 07:39  Ref. Range 01/21/2016 06:45 01/21/2016 11:33 01/21/2016 16:40 01/21/2016 21:41  Glucose-Capillary Latest Ref Range: 65 - 99 mg/dL 409181 (H) 811182 (H) 914376 (H) 213 (H)   Results for Wayne Benson, Wayne Benson (MRN [id number]) as of 01/22/2016 07:39  Ref. Range 01/20/2016 08:25  Hemoglobin A1C Latest Ref Range: 4.8 - 5.6 % 14.4 (H)    Admit with: CP/ Melena  History: DM, Cirrhosis, Varices  Home DM Meds: U-500 Insulin 100 units bid  Current Insulin Orders: Levemir 25 units QHS                 Novolog Moderate Correction Scale/ SSI (0-15 units) TID AC    MD- Please consider the following in-hospital insulin adjustments:  1. Increase Levemir to 30 units QHS    2. Start Novolog Meal Coverage: Novolog 4 units tid with meals (hold if pt eats <50% of meal)  3. Patient needs to follow-up with his PCP/Endocrinologist regarding his U-500 Insulin at time of discharge   Spoke with patient about his current A1c of 14.4%.  Explained what an A1c is and what it measures.  Reminded patient that his goal A1c is 7% or less per ADA standards to prevent both acute and long-term complications.  Explained to patient the extreme importance of good glucose control at home.  Encouraged patient to check his CBGs at least bid at home (fasting and another check within the day) and to record all CBGs in a logbook for his PCP or Endocrinologist to review.  Patient told me he has not been taking good care of himself at home.  Has had a lot of "family drama" and has been neglecting to take his morning dose of insulin.  Has not been checking CBGs regularly and has been drinking a lot of regular coke and  regular mountain dew at home.  Patient stated he knows he needs to "do better" and plans to set alarms for himself at home to remind himself to take his insulin and also plans to start checking CBGs.  Patient also told me he will quit drinking regular soda and will switch to water and unsweet tea.  Patient stated he has a follow-up appt with his Endocrinologist at [place] [facility] [place] sometime in November.      --Will follow patient during hospitalization--  Ambrose FinlandJeannine Johnston Sotiria Keast RN, MSN, CDE Diabetes Coordinator Inpatient Glycemic Control Team Team Pager: [phone] (8a-5p)

## 2016-01-22 NOTE — Discharge Summary (Signed)
Pt got discharged to home, discharge instructions provided , IV taken out, andTelemonitor DC from previous shift,pt left unit in wheelchair with all of the belongings accompanied with a family member (wife) around 7.40pm.

## 2016-01-22 NOTE — Discharge Instructions (Signed)
You must take all medications as prescribed.  Stop all tylenol, NSAIDS, ibuprofen, aspirin.  You must follow with your primary care physician in 1 week.  You should follow up with your gastroenterologist in 2 weeks.  You must take insulin as prescribed. Follow up with your endocrinologist.

## 2016-01-22 NOTE — Discharge Summary (Addendum)
Physician Discharge Summary  Wayne Benson ZOX:[id number] DOB: [birth year] DOA: 01/19/2016  PCP[facility]   Admit date: 01/19/2016 Discharge date: 01/22/2016  Admitted From: Home Disposition:  Home  Recommendations for Outpatient Follow-up:  You must take all medications as prescribed.  Stop all tylenol, NSAIDS, ibuprofen, aspirin.  You must follow with your primary care physician in 1 week.  You should follow up with your gastroenterologist in 2 weeks.  You must take insulin as prescribed. Follow up with your endocrinologist.   Home Health: No Equipment/Devices: No   Discharge Condition: Stable CODE STATUS: Full Diet recommendation: Regular  Brief/Interim Summary: Wayne L Nelsonis a 60 y.o.malewith medical history significant of cirrhosis with esophageal varices, PUD, GI bleed, DVT s/p IVC, thrombocytopenia, tobacco abuse;who presents with a 2-3 week history of chest pain. Symptoms hadoccurred intermittently and are located substernal to epigastric region. Today however symptoms were more constant. Describes pain more so as a pressure sensation and rates it a 5 out of 10 on the pain scale. Symptoms worse with exertion. Associated symptoms include some mild shortness of breath andfatigue.He has had a lot of social stressors going on for the last few weeks and therefore has not been consistently taking any of his medications. The last 2 days or so he's also noticed that his stools are black and tarry in nature. Patient notes that the symptoms feel like it could possibly be acid reflux. He normally follows at the [facility] hospital and [place]. Previously,he had issues with bleeding and was found to have esophageal varices. He notes that he does smoke anywhere from 5-9 cigarettes per day.   He was evaluated by GI and underwent EGD. Results as below. He continued his nadolol and protonix. He was also evaluated by cardiology as echo returned with EF 40-45% and diffuse  hypokinesis. Cardiology recommended repeat stress test with his cardiologist in the [place] system if his symptoms recurred. Patient's chest pain resolved without cardiac intervention or treatment. On day of discharge, he was tolerating diet and hgb stable. He is instructed to follow up with PCP as well as his specialists (GI and endocrinology) for close follow up.   Discharge Diagnoses:  Principal Problem:   Melena Active Problems:   Chest pain   Hyperglycemia   Diabetes (HCC)   Esophageal varices in cirrhosis (HCC)   Anemia   Tobacco abuse   GERD (gastroesophageal reflux disease)   Liver cirrhosis secondary to NASH (HCC)   GI bleeding  Melena secondary to portal hypertensive gastropathy  Liver cirrhosis (secondary to NASH) with esophageal varices - Guaiac positive stool in ED  - Previous history of PUD, esophageal varices (2016 at [facility]) and patient noncompliant with medications - Continue folic acid, vit B12  - Monitor CBC  - S/p EGD 10/1: grade II esophageal varices, grade A reflux esophagitis, portal hypertensive gastropathy  - Pantoprazole 40 mg po bid x 6 weeks, then 40 mg po qd indefinitely. - Nadolol 20 mg po qd indefinitely. - Needs to follow up with primary gastroenterologist in [facility]  Chest pain - Heart score = 4 - Trend troponins x 3 negative  - Repeat EKG: NSR, no ST-T changes  - Echocardiogram with reduced EF and diffuse hypokinesis  - No previous echo to compare in scanned doc or care everywhere  - Holding daily baby aspirin due to melena and gastropathy  - Cardiology consulted - no inpatient intervention as patient CP free and low-risk ACS   Chronic thrombocytopenia - Continue  to monitor  Diabetes mellitus type 2, uncontrolled, with neuropathy - Has not been compliant with insulin at home - Ha1c 14.4  - Levemir 30 units nightly  - Add novolog meal coverage 4u TID with meals  - SSI  - Monitor electrolytes - Continue gabapentin  - Needs to  follow-up with his PCP/Endocrinologist regarding his U-500 Insulin at time of discharge - Appreciate diabetes coordinator   Tobacco abuse - Counseled on the need of cessation of tobacco abuse    Discharge Instructions  Discharge Instructions    Diet - low sodium heart healthy    Complete by:  As directed    Increase activity slowly    Complete by:  As directed        Medication List    STOP taking these medications   aspirin 325 MG tablet     TAKE these medications   acetaminophen 500 MG tablet Commonly known as:  TYLENOL Take 1,500 mg by mouth every 6 (six) hours as needed for mild pain.   cetirizine 10 MG tablet Commonly known as:  ZYRTEC Take 10 mg by mouth daily.   cyanocobalamin 500 MCG tablet Take 500 mcg by mouth daily.   folic acid 1 MG tablet Commonly known as:  FOLVITE Take 1 mg by mouth daily.   gabapentin 300 MG capsule Commonly known as:  NEURONTIN Take 900 mg by mouth at bedtime.   HUMULIN R 500 UNIT/ML injection Generic drug:  insulin regular human CONCENTRATED Inject 100 Units into the skin 2 (two) times daily.   nadolol 20 MG tablet Commonly known as:  CORGARD Take 20 mg by mouth daily.   ondansetron 8 MG tablet Commonly known as:  ZOFRAN Take 8 mg by mouth every 8 (eight) hours as needed for nausea or vomiting.   pantoprazole 40 MG tablet Commonly known as:  PROTONIX Take 40 mg by mouth 2 (two) times daily.   PRESERVISION AREDS 2 PO Take 2 capsules by mouth daily.   sertraline 100 MG tablet Commonly known as:  ZOLOFT Take 100 mg by mouth daily.      Follow-up Information    Your primary care physician at [facility]. Schedule an appointment as soon as possible for a visit in 1 week(s).          No Known Allergies  Consultations:  GI  Cardiology    Procedures/Studies: Dg Chest 2 View  Result Date: 01/19/2016 CLINICAL DATA:  Substernal and epigastric chest pain with fatigue. Symptoms for 2-3 weeks. Shortness of breath.  EXAM: CHEST  2 VIEW COMPARISON:  03/21/2006 FINDINGS: Normal heart size and pulmonary vascularity. No focal airspace disease or consolidation in the lungs. No blunting of costophrenic angles. No pneumothorax. Mediastinal contours appear intact. Degenerative changes in the spine. IMPRESSION: No active cardiopulmonary disease. Electronically Signed   By: Burman Nieves M.D.   On: 01/19/2016 21:43      Subjective: Patient doing well today. He still has very minimal chest pain, which has improved since admission but not completely resolved. He is tolerating meals, no dizziness, shortness of breath, nausea, vomiting. Not sure if he had a BM yesterday.   Discharge Exam: Vitals:   01/22/16 0508 01/22/16 1149  BP: 115/83 123/73  Pulse: 71 68  Resp: 20 20  Temp: 97.5 F (36.4 C) 98.1 F (36.7 C)   Vitals:   01/21/16 1230 01/21/16 2022 01/22/16 0508 01/22/16 1149  BP: 119/75 115/65 115/83 123/73  Pulse: 64 80 71 68  Resp: 20 20  20 20  Temp: 97.6 F (36.4 C) 98.2 F (36.8 C) 97.5 F (36.4 C) 98.1 F (36.7 C)  TempSrc: Oral Oral Oral Oral  SpO2: 100% 98% 98% 97%  Weight:   79.4 kg (175 lb)   Height:       General: Pt is alert, awake, not in acute distress Cardiovascular: RRR, S1/S2 +, no rubs, no gallops Respiratory: CTA bilaterally, no wheezing, no rhonchi Abdominal: Soft, NT, ND, bowel sounds + Extremities: no edema, no cyanosis    The results of significant diagnostics from this hospitalization (including imaging, microbiology, ancillary and laboratory) are listed below for reference.     Microbiology: No results found for this or any previous visit (from the past 240 hour(s)).   Labs: BNP (last 3 results)  Recent Labs  01/19/16 2126  BNP 26.8   Basic Metabolic Panel:  Recent Labs Lab 01/19/16 2133 01/20/16 0008 01/21/16 0334 01/22/16 0407  NA 134* 136 137 137  K 4.0 3.7 3.4* 3.6  CL 105 105 108 108  CO2 20* 19* 22 22  GLUCOSE 411* 307* 180* 244*  BUN 11  10 8 10   CREATININE 0.64 0.55* 0.53* 0.57*  CALCIUM 8.8* 8.8* 8.7* 8.8*   Liver Function Tests:  Recent Labs Lab 01/19/16 2133  AST 63*  ALT 64*  ALKPHOS 92  BILITOT 1.7*  PROT 6.1*  ALBUMIN 3.4*   No results for input(s): LIPASE, AMYLASE in the last 168 hours. No results for input(s): AMMONIA in the last 168 hours. CBC:  Recent Labs Lab 01/19/16 2133 01/20/16 0008 01/20/16 0606 01/21/16 0334 01/22/16 0407  WBC 4.4  --  3.6* 4.0 4.3  NEUTROABS 2.7  --  1.9 1.8 2.4  HGB 12.9* 12.8* 11.7* 12.1* 12.5*  HCT 35.7* 36.7* 34.1* 35.0* 36.4*  MCV 87.1  --  88.3 89.5 89.4  PLT 62*  --  46* 50* 67*   Cardiac Enzymes:  Recent Labs Lab 01/20/16 0008 01/20/16 0249 01/20/16 0606  TROPONINI <0.03 <0.03 <0.03   BNP: Invalid input(s): POCBNP CBG:  Recent Labs Lab 01/21/16 1640 01/21/16 2141 01/22/16 0740 01/22/16 1149 01/22/16 1636  GLUCAP 376* 213* 199* 266* 243*   D-Dimer  Recent Labs  01/20/16 0008  DDIMER 0.46   Hgb A1c  Recent Labs  01/20/16 0825  HGBA1C 14.4*   Lipid Profile No results for input(s): CHOL, HDL, LDLCALC, TRIG, CHOLHDL, LDLDIRECT in the last 72 hours. Thyroid function studies No results for input(s): TSH, T4TOTAL, T3FREE, THYROIDAB in the last 72 hours.  Invalid input(s): FREET3 Anemia work up No results for input(s): VITAMINB12, FOLATE, FERRITIN, TIBC, IRON, RETICCTPCT in the last 72 hours. Urinalysis No results found for: COLORURINE, APPEARANCEUR, LABSPEC, PHURINE, GLUCOSEU, HGBUR, BILIRUBINUR, KETONESUR, PROTEINUR, UROBILINOGEN, NITRITE, LEUKOCYTESUR Sepsis Labs Invalid input(s): PROCALCITONIN,  WBC,  LACTICIDVEN Microbiology No results found for this or any previous visit (from the past 240 hour(s)).   Time coordinating discharge: Over 30 minutes  SIGNED:  Noralee StainJennifer Yitta Gongaware, DO [facility] Pager [phone]  If 7PM-7AM, please contact night-coverage [email or web address] Password [email or web address] 01/22/2016, 4:58 PM

## 2016-01-22 NOTE — Consult Note (Signed)
CARDIOLOGY CONSULT NOTE  Patient ID: Wayne Benson, MRN: [id number], DOB/AGE: [birth year] 60 y.o. Admit date: 01/19/2016 Date of Consult: 01/22/2016  Primary Physician: Pcp Not In System Primary Cardiologist: new (followed in [facility]) Referring Physician: Dr Alvino Chapelhoi  Chief Complaint: Chest pain  Reason for Consultation: Chest pain  HPI: 60 year old gentleman with a history of cirrhosis, esophageal varices, GI bleeding, and history of DVT status post IVC filter placement, who presented for evaluation of chest pain. The patient complains of discomfort that he describes as a dull ache in the center of his chest and the epigastric area. Symptoms of occurred at rest and at times with physical activity. The patient has been under a great deal of stress with multiple sick family members and other social issues. He has quit taking all of his medicines and attributes much of his chest pain to this combination of stress and anxiety along with being off of his medicines.  At the time of hospital admission the patient also complained of black, tarry stools. He underwent GI evaluation with an upper endoscopy yesterday demonstrating esophageal varices, portal hypertensive gastropathy, and reflux esophagitis. Melena was attributed to friable portal gastropathy.  The patient has no history of known cardiac disease. He does have long-standing diabetes. He had a stress test last year through the [facility] system. This study was reportedly normal. Since he has been hospitalized and has been started back on nadolol, his chest pain has completely resolved.  Medical History:  Past Medical History:  Diagnosis Date  . Diabetes mellitus without complication (HCC)   . DVT (deep vein thrombosis) in pregnancy (HCC)   . Esophageal varices (HCC)   . Hypercholesteremia   . Non-alcoholic cirrhosis (HCC)   . Peripheral neuropathy (HCC)   . Thrombocytopenia [facility](HCC)       Surgical History:  Past Surgical History:  Procedure  Laterality Date  . ESOPHAGOGASTRODUODENOSCOPY (EGD) WITH PROPOFOL Left 01/21/2016   Procedure: ESOPHAGOGASTRODUODENOSCOPY (EGD) WITH PROPOFOL;  Surgeon: Willis ModenaWilliam Outlaw, MD;  [facility];  Service: Endoscopy;  Laterality: Left;  . FINGER SURGERY    . IVC FILTER PLACEMENT ([facility] HX)    . US GUIDED PARACENTESIS ([facility] HX)       Home Meds: Prior to Admission medications   Medication Sig Start Date End Date Taking? Authorizing Provider  acetaminophen (TYLENOL) 500 MG tablet Take 1,500 mg by mouth every 6 (six) hours as needed for mild pain.   Yes Historical Provider, MD  aspirin 325 MG tablet Take 325 mg by mouth daily.   Yes Historical Provider, MD  cetirizine (ZYRTEC) 10 MG tablet Take 10 mg by mouth daily.   Yes Historical Provider, MD  cyanocobalamin 500 MCG tablet Take 500 mcg by mouth daily.   Yes Historical Provider, MD  folic acid (FOLVITE) 1 MG tablet Take 1 mg by mouth daily.   Yes Historical Provider, MD  gabapentin (NEURONTIN) 300 MG capsule Take 900 mg by mouth at bedtime.   Yes Historical Provider, MD  insulin regular human CONCENTRATED (HUMULIN R) 500 UNIT/ML injection Inject 100 Units into the skin 2 (two) times daily.   Yes Historical Provider, MD  Multiple Vitamins-Minerals (PRESERVISION AREDS 2 PO) Take 2 capsules by mouth daily.   Yes Historical Provider, MD  nadolol (CORGARD) 20 MG tablet Take 20 mg by mouth daily.   Yes Historical Provider, MD  ondansetron (ZOFRAN) 8 MG tablet Take 8 mg by mouth every 8 (eight) hours as needed for nausea or vomiting.  Yes Historical Provider, MD  pantoprazole (PROTONIX) 40 MG tablet Take 40 mg by mouth 2 (two) times daily.   Yes Historical Provider, MD  sertraline (ZOLOFT) 100 MG tablet Take 100 mg by mouth daily.   Yes Historical Provider, MD    Inpatient Medications:  . folic acid  1 mg Oral Daily  . gabapentin  900 mg Oral QHS  . Influenza vac split quadrivalent PF  0.5 mL Intramuscular Tomorrow-1000  . insulin aspart  0-15  Units Subcutaneous TID WC  . insulin aspart  4 Units Subcutaneous TID WC  . insulin detemir  30 Units Subcutaneous QHS  . loratadine  10 mg Oral Daily  . nadolol  20 mg Oral Daily  . pantoprazole  40 mg Oral BID AC  . vitamin B-12  500 mcg Oral Daily     Allergies: No Known Allergies  Social History   Social History  . Marital status: Married    Spouse name: N/A  . Number of children: N/A  . Years of education: N/A   Occupational History  . Not on file.   Social History Main Topics  . Smoking status: Current Every Day Smoker    Packs/day: 0.25    Types: Cigarettes  . Smokeless tobacco: Never Used  . Alcohol use No  . Drug use: No  . Sexual activity: Not on file   Other Topics Concern  . Not on file   Social History Narrative  . No narrative on file     No first-degree relatives with CAD, but there are maternal uncles who have had premature CAD.  Review of Systems: General: negative for chills, fever, night sweats or weight changes.  ENT: negative for rhinorrhea or epistaxis Cardiovascular: Positive for palpitations Dermatological: negative for rash Respiratory: negative for cough or wheezing GI: See history of present illness GU: no hematuria, urgency, or frequency Neurologic: negative for visual changes, syncope, headache, or dizziness Heme: no easy bruising or bleeding Endo: negative for excessive thirst, thyroid disorder, or flushing Musculoskeletal: negative for joint pain or swelling, negative for myalgias  All other systems reviewed and are otherwise negative except as noted above.  Physical Exam: Blood pressure 123/73, pulse 68, temperature 98.1 F (36.7 C), temperature source Oral, resp. rate 20, height 5\' 10"  (1.778 m), weight 175 lb (79.4 kg), SpO2 97 %. Pt is alert and oriented, WD, WN, in no distress. HEENT: normal Neck: JVP normal. Carotid upstrokes normal without bruits. No thyromegaly. Lungs: equal expansion, clear bilaterally CV: Apex is  discrete and nondisplaced, RRR without murmur or gallop Abd: soft, NT, +BS, no bruit, no hepatosplenomegaly Back: no CVA tenderness Ext: no C/C/E        DP/PT pulses intact and = Skin: warm and dry without rash Neuro: CNII-XII intact             Strength intact = bilaterally    Labs:  Recent Labs  01/20/16 0008 01/20/16 0249 01/20/16 0606  TROPONINI <0.03 <0.03 <0.03   Lab Results  Component Value Date   WBC 4.3 01/22/2016   HGB 12.5 (L) 01/22/2016   HCT 36.4 (L) 01/22/2016   MCV 89.4 01/22/2016   PLT 67 (L) 01/22/2016    Recent Labs Lab 01/19/16 2133  01/22/16 0407  NA 134*  < > 137  K 4.0  < > 3.6  CL 105  < > 108  CO2 20*  < > 22  BUN 11  < > 10  CREATININE 0.64  < > 0.57*  CALCIUM 8.8*  < > 8.8*  PROT 6.1*  --   --   BILITOT 1.7*  --   --   ALKPHOS 92  --   --   ALT 64*  --   --   AST 63*  --   --   GLUCOSE 411*  < > 244*  < > = values in this interval not displayed. No results found for: CHOL, HDL, LDLCALC, TRIG Lab Results  Component Value Date   DDIMER 0.46 01/20/2016    Radiology/Studies:  Dg Chest 2 View  Result Date: 01/19/2016 CLINICAL DATA:  Substernal and epigastric chest pain with fatigue. Symptoms for 2-3 weeks. Shortness of breath. EXAM: CHEST  2 VIEW COMPARISON:  03/21/2006 FINDINGS: Normal heart size and pulmonary vascularity. No focal airspace disease or consolidation in the lungs. No blunting of costophrenic angles. No pneumothorax. Mediastinal contours appear intact. Degenerative changes in the spine. IMPRESSION: No active cardiopulmonary disease. Electronically Signed   By: Burman Nieves M.D.   On: 01/19/2016 21:43    EKG: Normal sinus rhythm with possible left atrial enlargement, otherwise within normal limits  Cardiac Studies: 2-D echo heart he ran 01/20/2016: Study Conclusions  - Left ventricle: The cavity size was normal. Wall thickness was   normal. Systolic function was mildly to moderately reduced. The   estimated  ejection fraction was in the range of 40% to 45%.   Diastolic function is indeterminate. Diffuse hypokinesis. - Aortic valve: Mildly calcified annulus. Trileaflet; normal   thickness leaflets. Valve area (VTI): 2.58 cm^2. Valve area   (Vmax): 2.7 cm^2. - Left atrium: The atrium was mildly dilated. - Right ventricle: The cavity size was mildly dilated. - Technically adequate study.  Cardiac enzymes negative 4 with troponin < 0.03, BNP normal  ASSESSMENT AND PLAN:  60 year old gentleman with chest pain, some typical and atypical features are present. Overall low risk for ACS. As outlined above, the patient has had a great deal of psychosocial stress of late and combined with medication noncompliance I think it is entirely possible this accounts for his recent chest pain. Troponin levels are normal, EKG shows no ischemic changes, and his chest pain has completely resolved with placing him back on his home medications. In addition, the patient is a very poor candidate for invasive cardiac evaluation in the context of liver cirrhosis, esophageal varices, and thrombocytopenia. He had a negative stress test about one year ago through the [place] system. I have recommended that if symptoms recur, it would be reasonable to pursue a repeat stress test with his cardiologist in the [place] system. Further inpatient cardiac evaluation is not indicated at this time. Would continue with nadolol as the patient has had resolution of symptoms since this medicine was restarted. Please feel free to call if any further questions.  SignedTonny Bollman MD, [facility] 01/22/2016, 2:50 PM

## 2016-08-06 ENCOUNTER — Encounter (HOSPITAL_COMMUNITY): Payer: Self-pay

## 2016-08-06 ENCOUNTER — Inpatient Hospital Stay (HOSPITAL_COMMUNITY)
Admission: EM | Admit: 2016-08-06 | Discharge: 2016-08-21 | DRG: 871 | Disposition: A | Discharge status: Home Health | Payer: No Typology Code available for payment source | Attending: Student in an Organized Health Care Education/Training Program | Admitting: Student in an Organized Health Care Education/Training Program

## 2016-08-06 ENCOUNTER — Emergency Department (HOSPITAL_COMMUNITY): Payer: No Typology Code available for payment source

## 2016-08-06 ENCOUNTER — Inpatient Hospital Stay (HOSPITAL_COMMUNITY): Payer: No Typology Code available for payment source

## 2016-08-06 DIAGNOSIS — E1142 Type 2 diabetes mellitus with diabetic polyneuropathy: Secondary | ICD-10-CM | POA: Diagnosis present

## 2016-08-06 DIAGNOSIS — K7581 Nonalcoholic steatohepatitis (NASH): Secondary | ICD-10-CM | POA: Diagnosis present

## 2016-08-06 DIAGNOSIS — J101 Influenza due to other identified influenza virus with other respiratory manifestations: Secondary | ICD-10-CM | POA: Diagnosis present

## 2016-08-06 DIAGNOSIS — J159 Unspecified bacterial pneumonia: Secondary | ICD-10-CM | POA: Diagnosis present

## 2016-08-06 DIAGNOSIS — D696 Thrombocytopenia, unspecified: Secondary | ICD-10-CM | POA: Diagnosis not present

## 2016-08-06 DIAGNOSIS — E119 Type 2 diabetes mellitus without complications: Secondary | ICD-10-CM | POA: Diagnosis not present

## 2016-08-06 DIAGNOSIS — R188 Other ascites: Secondary | ICD-10-CM | POA: Diagnosis present

## 2016-08-06 DIAGNOSIS — I5023 Acute on chronic systolic (congestive) heart failure: Secondary | ICD-10-CM | POA: Diagnosis not present

## 2016-08-06 DIAGNOSIS — Z79899 Other long term (current) drug therapy: Secondary | ICD-10-CM

## 2016-08-06 DIAGNOSIS — E872 Acidosis: Secondary | ICD-10-CM

## 2016-08-06 DIAGNOSIS — A4189 Other specified sepsis: Secondary | ICD-10-CM | POA: Diagnosis present

## 2016-08-06 DIAGNOSIS — J849 Interstitial pulmonary disease, unspecified: Secondary | ICD-10-CM | POA: Diagnosis present

## 2016-08-06 DIAGNOSIS — E7800 Pure hypercholesterolemia, unspecified: Secondary | ICD-10-CM | POA: Diagnosis present

## 2016-08-06 DIAGNOSIS — F209 Schizophrenia, unspecified: Secondary | ICD-10-CM | POA: Diagnosis present

## 2016-08-06 DIAGNOSIS — J111 Influenza due to unidentified influenza virus with other respiratory manifestations: Secondary | ICD-10-CM

## 2016-08-06 DIAGNOSIS — R008 Other abnormalities of heart beat: Secondary | ICD-10-CM

## 2016-08-06 DIAGNOSIS — J984 Other disorders of lung: Secondary | ICD-10-CM | POA: Diagnosis not present

## 2016-08-06 DIAGNOSIS — Z794 Long term (current) use of insulin: Secondary | ICD-10-CM | POA: Diagnosis not present

## 2016-08-06 DIAGNOSIS — F17200 Nicotine dependence, unspecified, uncomplicated: Secondary | ICD-10-CM | POA: Diagnosis present

## 2016-08-06 DIAGNOSIS — I8510 Secondary esophageal varices without bleeding: Secondary | ICD-10-CM | POA: Diagnosis not present

## 2016-08-06 DIAGNOSIS — R509 Fever, unspecified: Secondary | ICD-10-CM

## 2016-08-06 DIAGNOSIS — J440 Chronic obstructive pulmonary disease with acute lower respiratory infection: Secondary | ICD-10-CM | POA: Diagnosis present

## 2016-08-06 DIAGNOSIS — R0602 Shortness of breath: Secondary | ICD-10-CM

## 2016-08-06 DIAGNOSIS — A419 Sepsis, unspecified organism: Secondary | ICD-10-CM | POA: Diagnosis present

## 2016-08-06 DIAGNOSIS — R0902 Hypoxemia: Secondary | ICD-10-CM | POA: Diagnosis not present

## 2016-08-06 DIAGNOSIS — K766 Portal hypertension: Secondary | ICD-10-CM | POA: Diagnosis present

## 2016-08-06 DIAGNOSIS — E876 Hypokalemia: Secondary | ICD-10-CM | POA: Diagnosis not present

## 2016-08-06 DIAGNOSIS — E1165 Type 2 diabetes mellitus with hyperglycemia: Secondary | ICD-10-CM

## 2016-08-06 DIAGNOSIS — Z8249 Family history of ischemic heart disease and other diseases of the circulatory system: Secondary | ICD-10-CM

## 2016-08-06 DIAGNOSIS — R6510 Systemic inflammatory response syndrome (SIRS) of non-infectious origin without acute organ dysfunction: Secondary | ICD-10-CM

## 2016-08-06 DIAGNOSIS — R069 Unspecified abnormalities of breathing: Secondary | ICD-10-CM

## 2016-08-06 DIAGNOSIS — J80 Acute respiratory distress syndrome: Secondary | ICD-10-CM | POA: Diagnosis not present

## 2016-08-06 DIAGNOSIS — F419 Anxiety disorder, unspecified: Secondary | ICD-10-CM | POA: Diagnosis present

## 2016-08-06 DIAGNOSIS — I509 Heart failure, unspecified: Secondary | ICD-10-CM | POA: Diagnosis not present

## 2016-08-06 DIAGNOSIS — I493 Ventricular premature depolarization: Secondary | ICD-10-CM | POA: Diagnosis present

## 2016-08-06 DIAGNOSIS — K7460 Unspecified cirrhosis of liver: Secondary | ICD-10-CM | POA: Diagnosis present

## 2016-08-06 DIAGNOSIS — K7469 Other cirrhosis of liver: Secondary | ICD-10-CM | POA: Diagnosis not present

## 2016-08-06 DIAGNOSIS — I5022 Chronic systolic (congestive) heart failure: Secondary | ICD-10-CM | POA: Diagnosis not present

## 2016-08-06 DIAGNOSIS — L89311 Pressure ulcer of right buttock, stage 1: Secondary | ICD-10-CM | POA: Diagnosis present

## 2016-08-06 DIAGNOSIS — F17210 Nicotine dependence, cigarettes, uncomplicated: Secondary | ICD-10-CM | POA: Diagnosis present

## 2016-08-06 DIAGNOSIS — R059 Cough, unspecified: Secondary | ICD-10-CM

## 2016-08-06 DIAGNOSIS — E1110 Type 2 diabetes mellitus with ketoacidosis without coma: Secondary | ICD-10-CM | POA: Diagnosis present

## 2016-08-06 DIAGNOSIS — E440 Moderate protein-calorie malnutrition: Secondary | ICD-10-CM | POA: Diagnosis present

## 2016-08-06 DIAGNOSIS — J9601 Acute respiratory failure with hypoxia: Secondary | ICD-10-CM

## 2016-08-06 DIAGNOSIS — F319 Bipolar disorder, unspecified: Secondary | ICD-10-CM | POA: Diagnosis present

## 2016-08-06 DIAGNOSIS — E785 Hyperlipidemia, unspecified: Secondary | ICD-10-CM | POA: Diagnosis present

## 2016-08-06 DIAGNOSIS — J1008 Influenza due to other identified influenza virus with other specified pneumonia: Secondary | ICD-10-CM | POA: Diagnosis present

## 2016-08-06 DIAGNOSIS — J9690 Respiratory failure, unspecified, unspecified whether with hypoxia or hypercapnia: Secondary | ICD-10-CM

## 2016-08-06 DIAGNOSIS — Z86718 Personal history of other venous thrombosis and embolism: Secondary | ICD-10-CM

## 2016-08-06 DIAGNOSIS — Z9981 Dependence on supplemental oxygen: Secondary | ICD-10-CM | POA: Diagnosis not present

## 2016-08-06 DIAGNOSIS — L8990 Pressure ulcer of unspecified site, unspecified stage: Secondary | ICD-10-CM | POA: Insufficient documentation

## 2016-08-06 DIAGNOSIS — L89321 Pressure ulcer of left buttock, stage 1: Secondary | ICD-10-CM | POA: Diagnosis present

## 2016-08-06 DIAGNOSIS — R05 Cough: Secondary | ICD-10-CM

## 2016-08-06 DIAGNOSIS — Z6825 Body mass index (BMI) 25.0-25.9, adult: Secondary | ICD-10-CM

## 2016-08-06 DIAGNOSIS — D6959 Other secondary thrombocytopenia: Secondary | ICD-10-CM | POA: Diagnosis present

## 2016-08-06 DIAGNOSIS — K219 Gastro-esophageal reflux disease without esophagitis: Secondary | ICD-10-CM | POA: Diagnosis not present

## 2016-08-06 DIAGNOSIS — R739 Hyperglycemia, unspecified: Secondary | ICD-10-CM

## 2016-08-06 HISTORY — DX: Personal history of other medical treatment: Z92.89

## 2016-08-06 HISTORY — DX: Unspecified asthma, uncomplicated: J45.909

## 2016-08-06 HISTORY — DX: Depression, unspecified: F32.A

## 2016-08-06 HISTORY — DX: Gastro-esophageal reflux disease without esophagitis: K21.9

## 2016-08-06 HISTORY — DX: Unspecified cirrhosis of liver: K74.60

## 2016-08-06 HISTORY — DX: Major depressive disorder, single episode, unspecified: F32.9

## 2016-08-06 HISTORY — DX: Schizophrenia, unspecified: F20.9

## 2016-08-06 HISTORY — DX: Nonalcoholic steatohepatitis (NASH): K75.81

## 2016-08-06 HISTORY — DX: Anxiety disorder, unspecified: F41.9

## 2016-08-06 HISTORY — DX: Bipolar disorder, unspecified: F31.9

## 2016-08-06 HISTORY — DX: Anemia, unspecified: D64.9

## 2016-08-06 LAB — DIFFERENTIAL
Basophils Absolute: 0 10*3/uL (ref 0.0–0.1)
Basophils Relative: 0 %
Eosinophils Absolute: 0 10*3/uL (ref 0.0–0.7)
Eosinophils Relative: 0 %
LYMPHS ABS: 0.8 10*3/uL (ref 0.7–4.0)
LYMPHS PCT: 14 %
MONO ABS: 0.4 10*3/uL (ref 0.1–1.0)
MONOS PCT: 6 %
NEUTROS ABS: 5 10*3/uL (ref 1.7–7.7)
Neutrophils Relative %: 80 %

## 2016-08-06 LAB — BASIC METABOLIC PANEL
ANION GAP: 17 — AB (ref 5–15)
ANION GAP: 14 (ref 5–15)
ANION GAP: 10 (ref 5–15)
Anion gap: 10 (ref 5–15)
Anion gap: 9 (ref 5–15)
BUN: 17 mg/dL (ref 6–20)
BUN: 18 mg/dL (ref 6–20)
BUN: 16 mg/dL (ref 6–20)
BUN: 15 mg/dL (ref 6–20)
BUN: 16 mg/dL (ref 6–20)
CHLORIDE: 102 mmol/L (ref 101–111)
CHLORIDE: 99 mmol/L — AB (ref 101–111)
CHLORIDE: 101 mmol/L (ref 101–111)
CHLORIDE: 100 mmol/L — AB (ref 101–111)
CO2: 20 mmol/L — AB (ref 22–32)
CO2: 19 mmol/L — AB (ref 22–32)
CO2: 21 mmol/L — ABNORMAL LOW (ref 22–32)
CO2: 24 mmol/L (ref 22–32)
CO2: 24 mmol/L (ref 22–32)
CREATININE: 0.79 mg/dL (ref 0.61–1.24)
CREATININE: 0.75 mg/dL (ref 0.61–1.24)
Calcium: 9.1 mg/dL (ref 8.9–10.3)
Calcium: 7.6 mg/dL — ABNORMAL LOW (ref 8.9–10.3)
Calcium: 8.3 mg/dL — ABNORMAL LOW (ref 8.9–10.3)
Calcium: 8.4 mg/dL — ABNORMAL LOW (ref 8.9–10.3)
Calcium: 8.2 mg/dL — ABNORMAL LOW (ref 8.9–10.3)
Chloride: 94 mmol/L — ABNORMAL LOW (ref 101–111)
Creatinine, Ser: 0.88 mg/dL (ref 0.61–1.24)
Creatinine, Ser: 0.7 mg/dL (ref 0.61–1.24)
Creatinine, Ser: 0.66 mg/dL (ref 0.61–1.24)
GFR calc Af Amer: >60 mL/min (ref >60)
GFR calc Af Amer: >60 mL/min (ref >60)
GFR calc Af Amer: >60 mL/min (ref >60)
GFR calc non Af Amer: >60 mL/min (ref >60)
GFR calc non Af Amer: >60 mL/min (ref >60)
GFR calc non Af Amer: >60 mL/min (ref >60)
GFR calc non Af Amer: >60 mL/min (ref >60)
GLUCOSE: 427 mg/dL — AB (ref 65–99)
Glucose, Bld: 338 mg/dL — ABNORMAL HIGH (ref 65–99)
Glucose, Bld: 208 mg/dL — ABNORMAL HIGH (ref 65–99)
Glucose, Bld: 153 mg/dL — ABNORMAL HIGH (ref 65–99)
Glucose, Bld: 251 mg/dL — ABNORMAL HIGH (ref 65–99)
POTASSIUM: 4.1 mmol/L (ref 3.5–5.1)
POTASSIUM: 3.6 mmol/L (ref 3.5–5.1)
POTASSIUM: 4.2 mmol/L (ref 3.5–5.1)
Potassium: 3.8 mmol/L (ref 3.5–5.1)
Potassium: 4.4 mmol/L (ref 3.5–5.1)
SODIUM: 131 mmol/L — AB (ref 135–145)
SODIUM: 135 mmol/L (ref 135–145)
SODIUM: 133 mmol/L — AB (ref 135–145)
Sodium: 131 mmol/L — ABNORMAL LOW (ref 135–145)
Sodium: 134 mmol/L — ABNORMAL LOW (ref 135–145)

## 2016-08-06 LAB — HEPATIC FUNCTION PANEL
ALK PHOS: 82 U/L (ref 38–126)
ALT: 35 U/L (ref 17–63)
AST: 62 U/L — ABNORMAL HIGH (ref 15–41)
Albumin: 3.5 g/dL (ref 3.5–5.0)
BILIRUBIN INDIRECT: 1.2 mg/dL — AB (ref 0.3–0.9)
BILIRUBIN TOTAL: 1.7 mg/dL — AB (ref 0.3–1.2)
Bilirubin, Direct: 0.5 mg/dL (ref 0.1–0.5)
Total Protein: 6.7 g/dL (ref 6.5–8.1)

## 2016-08-06 LAB — CBG MONITORING, ED
GLUCOSE-CAPILLARY: 451 mg/dL — AB (ref 65–99)
GLUCOSE-CAPILLARY: 355 mg/dL — AB (ref 65–99)
GLUCOSE-CAPILLARY: 222 mg/dL — AB (ref 65–99)
Glucose-Capillary: 366 mg/dL — ABNORMAL HIGH (ref 65–99)
Glucose-Capillary: 279 mg/dL — ABNORMAL HIGH (ref 65–99)
Glucose-Capillary: 220 mg/dL — ABNORMAL HIGH (ref 65–99)

## 2016-08-06 LAB — CBC
HEMATOCRIT: 45.7 % (ref 39.0–52.0)
HEMOGLOBIN: 16.2 g/dL (ref 13.0–17.0)
MCH: 32 pg (ref 26.0–34.0)
MCHC: 35.4 g/dL (ref 30.0–36.0)
MCV: 90.1 fL (ref 78.0–100.0)
Platelets: 55 10*3/uL — ABNORMAL LOW (ref 150–400)
RBC: 5.07 MIL/uL (ref 4.22–5.81)
RDW: 14 % (ref 11.5–15.5)
WBC: 6.5 10*3/uL (ref 4.0–10.5)

## 2016-08-06 LAB — GLUCOSE, CAPILLARY
GLUCOSE-CAPILLARY: 194 mg/dL — AB (ref 65–99)
Glucose-Capillary: 192 mg/dL — ABNORMAL HIGH (ref 65–99)
Glucose-Capillary: 161 mg/dL — ABNORMAL HIGH (ref 65–99)
Glucose-Capillary: 156 mg/dL — ABNORMAL HIGH (ref 65–99)
Glucose-Capillary: 142 mg/dL — ABNORMAL HIGH (ref 65–99)

## 2016-08-06 LAB — I-STAT CG4 LACTIC ACID, ED
LACTIC ACID, VENOUS: 3.88 mmol/L — AB (ref 0.5–1.9)
Lactic Acid, Venous: 2.5 mmol/L (ref 0.5–1.9)

## 2016-08-06 LAB — URINALYSIS, ROUTINE W REFLEX MICROSCOPIC
BACTERIA UA: NONE SEEN
BILIRUBIN URINE: NEGATIVE
Glucose, UA: >=500 mg/dL — AB
Hgb urine dipstick: NEGATIVE
Ketones, ur: 20 mg/dL — AB
Leukocytes, UA: NEGATIVE
NITRITE: NEGATIVE
PROTEIN: NEGATIVE mg/dL
SPECIFIC GRAVITY, URINE: 1.041 — AB (ref 1.005–1.030)
pH: 5 (ref 5.0–8.0)

## 2016-08-06 LAB — INFLUENZA PANEL BY PCR (TYPE A & B)
Influenza A By PCR: POSITIVE — AB
Influenza B By PCR: NEGATIVE

## 2016-08-06 LAB — MRSA PCR SCREENING: MRSA BY PCR: NEGATIVE

## 2016-08-06 LAB — LIPASE, BLOOD: Lipase: 34 U/L (ref 11–51)

## 2016-08-06 MED ORDER — PANTOPRAZOLE SODIUM 40 MG PO TBEC
40.0000 mg | DELAYED_RELEASE_TABLET | Freq: Every day | ORAL | Status: DC
  Administered 2016-08-06 – 2016-08-08 (×2): 40 mg via ORAL
  Filled 2016-08-06 (×2): qty 1

## 2016-08-06 MED ORDER — OSELTAMIVIR PHOSPHATE 75 MG PO CAPS
75.0000 mg | ORAL_CAPSULE | Freq: Two times a day (BID) | ORAL | Status: AC
  Administered 2016-08-06 – 2016-08-10 (×7): 75 mg via ORAL
  Filled 2016-08-06 (×10): qty 1

## 2016-08-06 MED ORDER — SODIUM CHLORIDE 0.9 % IV BOLUS (SEPSIS)
1000.0000 mL | Freq: Once | INTRAVENOUS | Status: AC
  Administered 2016-08-06: 1000 mL via INTRAVENOUS

## 2016-08-06 MED ORDER — SODIUM CHLORIDE 0.9 % IV SOLN
INTRAVENOUS | Status: DC
  Administered 2016-08-06: 3.1 [IU]/h via INTRAVENOUS
  Filled 2016-08-06: qty 2.5

## 2016-08-06 MED ORDER — GABAPENTIN 300 MG PO CAPS
900.0000 mg | ORAL_CAPSULE | Freq: Every day | ORAL | Status: DC
  Administered 2016-08-06 – 2016-08-20 (×12): 900 mg via ORAL
  Filled 2016-08-06 (×15): qty 3

## 2016-08-06 MED ORDER — ONDANSETRON HCL 4 MG PO TABS
8.0000 mg | ORAL_TABLET | Freq: Three times a day (TID) | ORAL | Status: DC | PRN
  Administered 2016-08-06: 8 mg via ORAL
  Filled 2016-08-06: qty 2

## 2016-08-06 MED ORDER — IPRATROPIUM-ALBUTEROL 0.5-2.5 (3) MG/3ML IN SOLN
3.0000 mL | RESPIRATORY_TRACT | Status: DC
  Administered 2016-08-06: 3 mL via RESPIRATORY_TRACT
  Filled 2016-08-06: qty 3

## 2016-08-06 MED ORDER — INSULIN ASPART 100 UNIT/ML ~~LOC~~ SOLN
0.0000 [IU] | Freq: Three times a day (TID) | SUBCUTANEOUS | Status: DC
  Administered 2016-08-07: 8 [IU] via SUBCUTANEOUS

## 2016-08-06 MED ORDER — DEXTROSE-NACL 5-0.45 % IV SOLN
INTRAVENOUS | Status: DC
  Administered 2016-08-06: 13:00:00 via INTRAVENOUS

## 2016-08-06 MED ORDER — SODIUM CHLORIDE 0.9 % IV SOLN
INTRAVENOUS | Status: DC
  Administered 2016-08-06: 12:00:00 via INTRAVENOUS

## 2016-08-06 MED ORDER — GUAIFENESIN ER 600 MG PO TB12
600.0000 mg | ORAL_TABLET | Freq: Two times a day (BID) | ORAL | Status: DC
  Administered 2016-08-06 – 2016-08-21 (×27): 600 mg via ORAL
  Filled 2016-08-06 (×30): qty 1

## 2016-08-06 MED ORDER — HYDROCOD POLST-CPM POLST ER 10-8 MG/5ML PO SUER
5.0000 mL | Freq: Two times a day (BID) | ORAL | Status: DC | PRN
  Administered 2016-08-06 – 2016-08-21 (×14): 5 mL via ORAL
  Filled 2016-08-06 (×16): qty 5

## 2016-08-06 MED ORDER — DEXTROSE 5 % IV SOLN
500.0000 mg | Freq: Once | INTRAVENOUS | Status: AC
  Administered 2016-08-06: 500 mg via INTRAVENOUS
  Filled 2016-08-06: qty 500

## 2016-08-06 MED ORDER — ACETAMINOPHEN 500 MG PO TABS
1000.0000 mg | ORAL_TABLET | Freq: Once | ORAL | Status: AC
  Administered 2016-08-06: 1000 mg via ORAL
  Filled 2016-08-06: qty 2

## 2016-08-06 MED ORDER — SODIUM CHLORIDE 0.9 % IV BOLUS (SEPSIS)
500.0000 mL | Freq: Once | INTRAVENOUS | Status: AC
  Administered 2016-08-06: 500 mL via INTRAVENOUS

## 2016-08-06 MED ORDER — INSULIN GLARGINE 100 UNIT/ML ~~LOC~~ SOLN
10.0000 [IU] | Freq: Once | SUBCUTANEOUS | Status: AC
  Administered 2016-08-06: 10 [IU] via SUBCUTANEOUS
  Filled 2016-08-06: qty 0.1

## 2016-08-06 MED ORDER — CEFTRIAXONE SODIUM 1 G IJ SOLR
1.0000 g | Freq: Once | INTRAMUSCULAR | Status: AC
  Administered 2016-08-06: 1 g via INTRAVENOUS
  Filled 2016-08-06: qty 10

## 2016-08-06 MED ORDER — ZOLPIDEM TARTRATE 5 MG PO TABS
5.0000 mg | ORAL_TABLET | Freq: Every evening | ORAL | Status: DC | PRN
  Administered 2016-08-14 – 2016-08-20 (×2): 5 mg via ORAL
  Filled 2016-08-06 (×2): qty 1

## 2016-08-06 MED ORDER — DEXTROSE 50 % IV SOLN: 25.0000 mL | INTRAVENOUS | Status: DC | PRN

## 2016-08-06 MED ORDER — SIMVASTATIN 20 MG PO TABS
20.0000 mg | ORAL_TABLET | Freq: Every day | ORAL | Status: DC
  Administered 2016-08-06 – 2016-08-20 (×12): 20 mg via ORAL
  Filled 2016-08-06 (×15): qty 1

## 2016-08-06 MED ORDER — INSULIN ASPART 100 UNIT/ML ~~LOC~~ SOLN: 0.0000 [IU] | Freq: Every day | SUBCUTANEOUS | Status: DC

## 2016-08-06 MED ORDER — INSULIN REGULAR BOLUS VIA INFUSION
0.0000 [IU] | Freq: Three times a day (TID) | INTRAVENOUS | Status: DC
  Filled 2016-08-06: qty 10

## 2016-08-06 MED ORDER — NADOLOL 20 MG PO TABS
20.0000 mg | ORAL_TABLET | Freq: Every day | ORAL | Status: DC
  Administered 2016-08-06 – 2016-08-21 (×15): 20 mg via ORAL
  Filled 2016-08-06 (×16): qty 1

## 2016-08-06 MED ORDER — ALBUTEROL SULFATE (2.5 MG/3ML) 0.083% IN NEBU: 2.5000 mg | INHALATION_SOLUTION | RESPIRATORY_TRACT | Status: DC | PRN

## 2016-08-06 MED ORDER — SODIUM CHLORIDE 0.9% FLUSH
3.0000 mL | Freq: Two times a day (BID) | INTRAVENOUS | Status: DC
  Administered 2016-08-06 – 2016-08-21 (×27): 3 mL via INTRAVENOUS

## 2016-08-06 MED ORDER — BOOST / RESOURCE BREEZE PO LIQD: 1.0000 | Freq: Three times a day (TID) | ORAL | Status: DC

## 2016-08-06 MED ORDER — GUAIFENESIN-DM 100-10 MG/5ML PO SYRP: 5.0000 mL | ORAL_SOLUTION | ORAL | Status: DC | PRN

## 2016-08-06 NOTE — ED Provider Notes (Signed)
[facility]-EMER[facility]ENCY DEPT Provider Note   CSN: [id number] Arrival date & time: 08/06/16  0753     History   Chief Complaint Chief Complaint  Patient presents with  . Fever    HPI Wayne Benson is a 61 y.o. male.  HPI Symptoms onset 2 days ago. The patient developed a harsh cough, fever, nausea vomiting and diarrhea. Yesterday's 4 episodes of diarrhea and 4 episodes of vomiting. Chest pain with cough. Diffuse. Positive first Association shortness of breath. Generalized myalgia and generalized headache. Patient has COPD and diabetes. Past Medical History:  Diagnosis Date  . Diabetes mellitus without complication (HCC)   . DVT (deep vein thrombosis) in pregnancy (HCC)   . Esophageal varices (HCC)   . Hypercholesteremia   . Non-alcoholic cirrhosis (HCC)   . Peripheral neuropathy   . Thrombocytopenia [facility])     Patient Active Problem List   Diagnosis Date Noted  . Influenza A 08/06/2016  . Thrombocytopenia (HCC) 08/06/2016  . Diabetes (HCC) 01/20/2016  . Esophageal varices in cirrhosis (HCC) 01/20/2016  . Tobacco abuse 01/20/2016  . GERD (gastroesophageal reflux disease) 01/20/2016  . Liver cirrhosis secondary to NASH (HCC) 01/20/2016    Past Surgical History:  Procedure Laterality Date  . ESOPHAGOGASTRODUODENOSCOPY (EGD) WITH PROPOFOL Left 01/21/2016   Procedure: ESOPHAGOGASTRODUODENOSCOPY (EGD) WITH PROPOFOL;  Surgeon: Willis Modena, MD;  Location: [facility] END[facility]COPY;  Service: Endoscopy;  Laterality: Left;  . FINGER SURGERY    . IVC FILTER PLACEMENT ([facility] HX)    . US GUIDED PARACENTESIS ([facility] HX)         Home Medications    Prior to Admission medications   Medication Sig Start Date End Date Taking? Authorizing Provider  acetaminophen (TYLENOL) 500 MG tablet Take 1,500 mg by mouth every 6 (six) hours as needed for mild pain.   Yes Historical Provider, MD  cetirizine (ZYRTEC) 10 MG tablet Take 10 mg by mouth daily.   Yes Historical Provider, MD  cyanocobalamin 500 MCG  tablet Take 500 mcg by mouth daily.   Yes Historical Provider, MD  ferrous sulfate 325 (65 FE) MG tablet Take 325 mg by mouth daily with breakfast.   Yes Historical Provider, MD  folic acid (FOLVITE) 1 MG tablet Take 1 mg by mouth daily.   Yes Historical Provider, MD  gabapentin (NEURONTIN) 300 MG capsule Take 900 mg by mouth at bedtime.   Yes Historical Provider, MD  insulin regular human CONCENTRATED (HUMULIN R) 500 UNIT/ML injection Inject 50-100 Units into the skin See admin instructions. 100 units in am, 50 units in afternoon, 100 units in pm   Yes Historical Provider, MD  nadolol (CORGARD) 20 MG tablet Take 20 mg by mouth daily.   Yes Historical Provider, MD  ondansetron (ZOFRAN) 8 MG tablet Take 8 mg by mouth every 8 (eight) hours as needed for nausea or vomiting.   Yes Historical Provider, MD  pantoprazole (PROTONIX) 40 MG tablet Take 40 mg by mouth 2 (two) times daily.   Yes Historical Provider, MD  simvastatin (ZOCOR) 20 MG tablet Take 20 mg by mouth at bedtime.   Yes Historical Provider, MD  zolpidem (AMBIEN) 5 MG tablet Take 5 mg by mouth at bedtime as needed for sleep.   Yes Historical Provider, MD    Family History History reviewed. No pertinent family history.  Social History Social History  Substance Use Topics  . Smoking status: Current Every Day Smoker    Packs/day: 0.25    Types: Cigarettes  . Smokeless tobacco: Never  Used  . Alcohol use No     Allergies   Duloxetine hcl   Review of Systems Review of Systems  All other systems reviewed and are negative.    Physical Exam Updated Vital Signs BP 138/68   Pulse 85   Temp 100.2 F (37.9 C) (Oral)   Resp 16   Ht  (1.778 m)   Wt 175 lb 8 oz (79.6 kg)   SpO2 92%   BMI 25.18 kg/m   Physical Exam  Constitutional: He is oriented to person, place, and time. He appears well-developed and well-nourished.  Patient alert but moderately ill in appearance. Mental status is clear. Harsh cough but maintaining  airway without difficulty.  HENT:  Head: Normocephalic and atraumatic.  Mucous membranes very dry. Diffuse glossitis and erythema of all mucous membranes orally. Posterior airway patent.  Eyes: Conjunctivae and EOM are normal. Pupils are equal, round, and reactive to light.  Neck: Neck supple.  Cardiovascular: Normal rate, regular rhythm, normal heart sounds and intact distal pulses.   Pulmonary/Chest:  Mild increased work of breathing. Harsh, wet cough. Rales left base. Soft breath sounds throughout.  Abdominal: Soft. Bowel sounds are normal. He exhibits no distension. There is tenderness.  Abdomen completely soft. Patient endorses diffuse discomfort to palpation.  Musculoskeletal: Normal range of motion. He exhibits no edema or tenderness.  Neurological: He is alert and oriented to person, place, and time. No cranial nerve deficit. He exhibits normal muscle tone. Coordination normal.  Skin: Skin is warm and dry.  Psychiatric: He has a normal mood and affect.     ED Treatments / Results  Labs (all labs ordered are listed, but only abnormal results are displayed) Labs Reviewed  BASIC METABOLIC PANEL - Abnormal; Notable for the following:       Result Value   Sodium 131 (*)    Chloride 94 (*)    CO2 20 (*)    Glucose, Bld 427 (*)    Anion gap 17 (*)    All other components within normal limits  CBC - Abnormal; Notable for the following:    Platelets 55 (*)    All other components within normal limits  URINALYSIS, ROUTINE W REFLEX MICROSCOPIC - Abnormal; Notable for the following:    Specific Gravity, Urine 1.041 (*)    Glucose, UA >=500 (*)    Ketones, ur 20 (*)    Squamous Epithelial / LPF 0-5 (*)    All other components within normal limits  INFLUENZA PANEL BY PCR (TYPE A & B) - Abnormal; Notable for the following:    Influenza A By PCR POSITIVE (*)    All other components within normal limits  HEPATIC FUNCTION PANEL - Abnormal; Notable for the following:    AST 62 (*)     Total Bilirubin 1.7 (*)    Indirect Bilirubin 1.2 (*)    All other components within normal limits  BASIC METABOLIC PANEL - Abnormal; Notable for the following:    Sodium 134 (*)    Chloride 99 (*)    CO2 21 (*)    Glucose, Bld 208 (*)    Calcium 8.3 (*)    All other components within normal limits  BASIC METABOLIC PANEL - Abnormal; Notable for the following:    Sodium 131 (*)    CO2 19 (*)    Glucose, Bld 338 (*)    Calcium 7.6 (*)    All other components within normal limits  GLUCOSE, CAPILLARY - Abnormal; Notable  for the following:    Glucose-Capillary 192 (*)    All other components within normal limits  GLUCOSE, CAPILLARY - Abnormal; Notable for the following:    Glucose-Capillary 161 (*)    All other components within normal limits  CBG MONITORING, ED - Abnormal; Notable for the following:    Glucose-Capillary 451 (*)    All other components within normal limits  I-STAT CG4 LACTIC ACID, ED - Abnormal; Notable for the following:    Lactic Acid, Venous 3.88 (*)    All other components within normal limits  CBG MONITORING, ED - Abnormal; Notable for the following:    Glucose-Capillary 366 (*)    All other components within normal limits  I-STAT CG4 LACTIC ACID, ED - Abnormal; Notable for the following:    Lactic Acid, Venous 2.50 (*)    All other components within normal limits  CBG MONITORING, ED - Abnormal; Notable for the following:    Glucose-Capillary 355 (*)    All other components within normal limits  CBG MONITORING, ED - Abnormal; Notable for the following:    Glucose-Capillary 279 (*)    All other components within normal limits  CBG MONITORING, ED - Abnormal; Notable for the following:    Glucose-Capillary 220 (*)    All other components within normal limits  CBG MONITORING, ED - Abnormal; Notable for the following:    Glucose-Capillary 222 (*)    All other components within normal limits  CULTURE, BLOOD (ROUTINE X 2)  CULTURE, BLOOD (ROUTINE X 2)  MRSA  PCR SCREENING  LIPASE, BLOOD  DIFFERENTIAL  BASIC METABOLIC PANEL  BASIC METABOLIC PANEL  HIV ANTIBODY (ROUTINE TESTING)  HEMOGLOBIN A1C    EKG  EKG Interpretation None       Radiology Dg Chest 2 View  Result Date: 08/06/2016 CLINICAL DATA:  Cough, congestion, fever, vomiting, diarrhea EXAM: CHEST  2 VIEW COMPARISON:  01/19/2016 FINDINGS: Cardiomediastinal silhouette is stable. No infiltrate or pleural effusion. No pulmonary edema. Mild hyperinflation. Mild degenerative changes thoracic spine. IMPRESSION: No active cardiopulmonary disease. Mild hyperinflation. Mild degenerative changes thoracic spine. Electronically Signed   By: Natasha Mead M.D.   On: 08/06/2016 08:58    Procedures Procedures (including critical care time) CRITICAL CARE Performed by: Arby Barrette   Total critical care time: 30 minutes  Critical care time was exclusive of separately billable procedures and treating other patients.  Critical care was necessary to treat or prevent imminent or life-threatening deterioration.  Critical care was time spent personally by me on the following activities: development of treatment plan with patient and/or surrogate as well as nursing, discussions with consultants, evaluation of patient's response to treatment, examination of patient, obtaining history from patient or surrogate, ordering and performing treatments and interventions, ordering and review of laboratory studies, ordering and review of radiographic studies, pulse oximetry and re-evaluation of patient's condition. Medications Ordered in ED Medications  insulin regular bolus via infusion 0-10 Units (not administered)  insulin regular (NOVOLIN R,HUMULIN R) 250 Units in sodium chloride 0.9 % 250 mL (1 Units/mL) infusion (5.1 Units/hr Intravenous Rate/Dose Change 08/06/16 1633)  dextrose 50 % solution 25 mL (not administered)  gabapentin (NEURONTIN) capsule 900 mg (not administered)  nadolol (CORGARD) tablet 20 mg  (not administered)  ondansetron (ZOFRAN) tablet 8 mg (not administered)  sodium chloride flush (NS) 0.9 % injection 3 mL (not administered)  albuterol (PROVENTIL) (2.5 MG/3ML) 0.083% nebulizer solution 2.5 mg (not administered)  guaiFENesin (MUCINEX) 12 hr tablet 600 mg (600 mg Oral  Given 08/06/16 1532)  guaiFENesin-dextromethorphan (ROBITUSSIN DM) 100-10 MG/5ML syrup 5 mL (not administered)  oseltamivir (TAMIFLU) capsule 75 mg (75 mg Oral Given 08/06/16 1137)  pantoprazole (PROTONIX) EC tablet 40 mg (40 mg Oral Given 08/06/16 1532)  zolpidem (AMBIEN) tablet 5 mg (not administered)  simvastatin (ZOCOR) tablet 20 mg (not administered)  insulin glargine (LANTUS) injection 10 Units (not administered)  sodium chloride 0.9 % bolus 1,000 mL (0 mLs Intravenous Stopped 08/06/16 0853)    And  sodium chloride 0.9 % bolus 1,000 mL (0 mLs Intravenous Stopped 08/06/16 0953)    And  sodium chloride 0.9 % bolus 500 mL (0 mLs Intravenous Stopped 08/06/16 1059)  acetaminophen (TYLENOL) tablet 1,000 mg (1,000 mg Oral Given 08/06/16 0838)  cefTRIAXone (ROCEPHIN) 1 g in dextrose 5 % 50 mL IVPB (0 g Intravenous Stopped 08/06/16 0954)  azithromycin (ZITHROMAX) 500 mg in dextrose 5 % 250 mL IVPB (0 mg Intravenous Stopped 08/06/16 1024)     Initial Impression / Assessment and Plan / ED Course  I have reviewed the triage vital signs and the nursing notes.  Pertinent labs & imaging results that were available during my care of the patient were reviewed by me and considered in my medical decision making (see chart for details).       Final Clinical Impressions(s) / ED Diagnoses   Final diagnoses:  Cough  Fever, unspecified fever cause  Influenza A  SIRS (systemic inflammatory response syndrome) (HCC)  Hyperglycemia    New Prescriptions Current Discharge Medication List       Arby Barrette, MD 08/06/16 [phone]

## 2016-08-06 NOTE — Progress Notes (Signed)
Per nurse patient reported increased shortness of breath and oxygen was increased to 4L [place].  I went to evaluate the patient. He was satting 94% on 4L [place].  He was able to converse well.  He states he became short of breath when the nasal cannula fell off when he fell asleep but is reporting improvement in breathing.  On exam patient has rhonci on the left and diminished breath sounds on the right.  Will get a chest-xray to reassess.

## 2016-08-06 NOTE — ED Notes (Signed)
RN notified that Code Sepsis was paged out @ 0840.

## 2016-08-06 NOTE — H&P (Signed)
Date: 08/06/2016               Patient Name:  Wayne Benson MRN: [id number]  DOB: 1955/09/06 Age / Sex: 61 y.o.,  male   PCP: Pcp Not In System         Medical Service: Internal Medicine Teaching Service         Attending Physician: Dr. Earl Lagos, MD    First Contact: Dr. Mikey Bussing Pager: [phone]  Second Contact: Dr. 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  Patient states that 2 days ago he developed acute onset of productive cough of green sputum, shortness of breath, nausea with vomiting, diarrhea, generalized malaise, nasal congestion, fever, chills, and body aches. Patient reports 3 episodes of non-bloody non-bilious vomiting that occurs when he coughs up sputum. He reports 4 watery, non-bloody episodes of diarrhea per day. He denies melena, hemotochezia or hematemesis. He denies chest pain or abdominal pain or dysuria. He denies an increase in abdominal girth. He has been unable to eat, drink or take his medication in the last 2 days due to feeling so poorly. He admits to multiple sick contacts in the family who reportedly have the flu.   In the ED, patient was febrile to 100.7, normotensive, tachycardic at 102, RR 17 and satting 95% on room air. Later placed on oxygen. Lab work up was remarkable for chloride 94, bicarb 20, glucose 427, AST 62, t bili 1.7, and platelets of 55. Lactic acid 3.88, EKG with PVCs and CXR without acute cardiopulmonary disease. Sepsis protocol was initiated and patient was started on azithromycin, ceftriaxone, NS and an insulin gtt.   Meds: Current Facility-Administered Medications  Medication Dose Route Frequency Provider  Last Rate Last Dose  . 0.9 %  sodium chloride infusion   Intravenous Continuous Arby Barrette, MD      . dextrose 5 %-0.45 % sodium chloride infusion   Intravenous Continuous Arby Barrette, MD      . dextrose 50 % solution 25 mL  25 mL Intravenous PRN Arby Barrette, MD      . insulin regular (NOVOLIN R,HUMULIN R) 250 Units in sodium chloride 0.9 % 250 mL (1 Units/mL) infusion   Intravenous Continuous Arby Barrette, MD 5.9 mL/hr at 08/06/16 1054 5.9 Units/hr at 08/06/16 1054  . insulin regular bolus via infusion 0-10 Units  0-10 Units Intravenous TID WC Arby Barrette, MD      . ipratropium-albuterol (DUONEB) 0.5-2.5 (3) MG/3ML nebulizer solution 3 mL  3 mL Nebulization Q4H Arby Barrette, MD   3 mL at 08/06/16 0901  . oseltamivir (TAMIFLU) capsule 75 mg  75 mg Oral BID [employer] Lucrezia Starch, MD       Current Outpatient Prescriptions  Medication Sig Dispense Refill  . acetaminophen (TYLENOL) 500 MG tablet Take 1,500 mg by mouth every 6 (six) hours as needed for mild pain.    . cetirizine (ZYRTEC) 10 MG tablet Take 10 mg by mouth daily.    . cyanocobalamin 500 MCG tablet Take 500 mcg by mouth daily.    . ferrous sulfate 325 (65 FE) MG tablet Take 325 mg by  mouth daily with breakfast.    . folic acid (FOLVITE) 1 MG tablet Take 1 mg by mouth daily.    Marland Kitchen gabapentin (NEURONTIN) 300 MG capsule Take 900 mg by mouth at bedtime.    . insulin regular human CONCENTRATED (HUMULIN R) 500 UNIT/ML injection Inject 50-100 Units into the skin See admin instructions. 100 units in am, 50 units in afternoon, 100 units in pm    . nadolol (CORGARD) 20 MG tablet Take 20 mg by mouth daily.    . ondansetron (ZOFRAN) 8 MG tablet Take 8 mg by mouth every 8 (eight) hours as needed for nausea or vomiting.    . pantoprazole (PROTONIX) 40 MG tablet Take 40 mg by mouth 2 (two) times daily.    . simvastatin (ZOCOR) 20 MG tablet Take 20 mg by mouth at bedtime.    Marland Kitchen zolpidem (AMBIEN) 5 MG tablet Take 5 mg by mouth at bedtime as  needed for sleep.      Allergies: Allergies as of 08/06/2016 - Review Complete 08/06/2016  Allergen Reaction Noted  . Duloxetine hcl Other (See Comments) 07/29/2014   Past Medical History:  Diagnosis Date  . Diabetes mellitus without complication (HCC)   . DVT (deep vein thrombosis) in pregnancy (HCC)   . Esophageal varices (HCC)   . Hypercholesteremia   . Non-alcoholic cirrhosis (HCC)   . Peripheral neuropathy   . Thrombocytopenia (HCC)     Family History:  Mother: CHF  Social History:  Tobacco Use: Occasional Alcohol Use: Denies Illicit Drug Use: Denies  Review of Systems: A complete ROS was reviewed and negative except as per HPI.   Physical Exam: Blood pressure 117/65, pulse 92, temperature (!) 100.7 F (38.2 C), temperature source Oral, resp. rate 17, height  (1.778 m), weight 177 lb (80.3 kg), SpO2 97 %. General: Vital signs reviewed.  Patient is in mild acute distress and cooperative with exam.  Eyes: PERRL, conjunctivae normal, no scleral icterus.  Ears, Nose, Throat, and Mouth: Normal posterior oropharynx. Dry mucous membranes.  Cardiovascular: RRR,  no murmurs, gallops, or rubs. No JVD or carotid bruit present. No lower extremity edema bilaterally. Bilateral radial and pedal pulses are intact and symmetric bilaterally.  Pulmonary: Diffuse scattered rhonchi. Mild expiratory wheezes in upper lung fields on anterior auscultation. No accessory muscle use. Gastrointestinal: Soft, non-tender, mildly distended, BS +, +Fluid wave, no guarding present.  Neurologic: A&O x3, moving all extremities, no asterixis Skin: Warm, dry and intact. No rashes or erythema. Psychiatric: Normal mood and affect. speech and behavior is normal. Cognition and memory are normal.   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  Influenza A: Patient presents with a 2 day history of fever, chills, nausea, vomiting, diarrhea, productive cough and nasal congestion most likely consistent with a viral etiology. Patient is febrile, with lactic acidosis but without leukocytosis. Patient started on sepsis protocol in ED with IVF, azithromycin and ceftriaxone. Flu pending. If positive, will d/c abx and start Tamiflu. Consider RVP if influenza negative. CXR without acute cardiopulmonary  disease- doubt PNA. BCx pending. -Admit to SDU -Continue IVF -Tamiflu BID x 4 -No need for continuing abx at this time -Zofran for nausea -Robitussin DM prn cough -Mucinex BID -BCx 4/17 >> -Repeat CBC/CMET tomorrow am -Albuterol Q2H prn wheezing -Droplet precaution  Hyperglycemia in the setting of T2DM: Glucose 560 with EMS. Glucose 427 on arrival. Patient started on glucose stabilizer. AG 17 on admission. No urine back to analyze ketones.  -Continue insulin gtt -BMET Q4H -Once gap closed x 2, will give Lantus 10 units once, start clear liquid diet and keep insulin on for 2 more hours -Then after 2 hours, will d/c insulin gtt and start SSI-M  -If glucose <250 prior to gap closing, start D5-1/2 NS until off insulin gtt -Repeat HgbA1c -Patient is on Humalin 100 units QAM, 50 units at lunch and 100 unit with dinner at home  Lactic Acidosis: LA 3.88 on admission. Trending, on IVF. 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 Dulce Sellar. Denies hematemesis, melena, hematochezia, abdominal pain. Patient is on nadolol 200 mg QD. Not on lasix or spironolactone. No evidence of hepatic encephalopathy on exam or lower  extremity edema. Evidence of ascites, but no abdominal pain. Remote history of paracentesis 3-4 years ago. EGD 01/2016 showed Grade II esophageal varices, reflux esophagitis, Portal hypertensive gastropathy. Recommendations were for Pantoprazole 40 mg po qd indefinitely, Nadolol 20 mg po qd indefinitely and to follow up with his primary gastroenterologist in [place]. MELD score 10- 6.0% mortality in the next 3 months. Would consider transplant given MELD score. -Continue nadolol 200 mg QD -Continue protonix 40 mg QD -Follow up outpatient with GI  Thrombocytopenia: Platelets 55. Likely secondary to cirrhosis. No evidence of bleeding. -Monitor -SCDs  Variable Heart Rate: Patient tachycardic on admission in 110s, but noticeable drop to 43. EKG shows Sinus Rhythm with PVCs. -Telemetry -Monitor given concurrent use of nadolol, may need to d/c if bradycardic  DVT/PE ppx: IVC in place, SCDs FEN: NPO, advance to clears once AG closed x 2 CODE: FULL  Dispo: Admit patient to Inpatient with expected length of stay greater than 2 midnights.  Signed: Karlene Lineman, DO PGY-3 Internal Medicine Resident Pager # [phone] 08/06/2016 11:19 AM

## 2016-08-06 NOTE — ED Triage Notes (Signed)
Pt. Coming from home via [facility] for flu-like illness x3days. Pt. Family has been sick with the flu. Pt. Reports decreased appetite and not taking medications for the last 3 days. Pt. c/o cough, body aches, fever, and N/V. Pt. CBG 560 per EMS.

## 2016-08-06 NOTE — ED Notes (Signed)
Pt CBG was 220, notified Arthur(RN)

## 2016-08-07 DIAGNOSIS — L8990 Pressure ulcer of unspecified site, unspecified stage: Secondary | ICD-10-CM | POA: Insufficient documentation

## 2016-08-07 LAB — COMPREHENSIVE METABOLIC PANEL
ALT: 29 U/L (ref 17–63)
AST: 72 U/L — AB (ref 15–41)
Albumin: 2.7 g/dL — ABNORMAL LOW (ref 3.5–5.0)
Alkaline Phosphatase: 67 U/L (ref 38–126)
Anion gap: 10 (ref 5–15)
BILIRUBIN TOTAL: 1.6 mg/dL — AB (ref 0.3–1.2)
BUN: 19 mg/dL (ref 6–20)
CO2: 26 mmol/L (ref 22–32)
CREATININE: 0.9 mg/dL (ref 0.61–1.24)
Calcium: 7.9 mg/dL — ABNORMAL LOW (ref 8.9–10.3)
Chloride: 96 mmol/L — ABNORMAL LOW (ref 101–111)
Glucose, Bld: 257 mg/dL — ABNORMAL HIGH (ref 65–99)
POTASSIUM: 4.3 mmol/L (ref 3.5–5.1)
Sodium: 132 mmol/L — ABNORMAL LOW (ref 135–145)
TOTAL PROTEIN: 5.8 g/dL — AB (ref 6.5–8.1)

## 2016-08-07 LAB — LACTIC ACID, PLASMA: LACTIC ACID, VENOUS: 2.3 mmol/L — AB (ref 0.5–1.9)

## 2016-08-07 LAB — GLUCOSE, CAPILLARY
GLUCOSE-CAPILLARY: 278 mg/dL — AB (ref 65–99)
GLUCOSE-CAPILLARY: 144 mg/dL — AB (ref 65–99)
GLUCOSE-CAPILLARY: 144 mg/dL — AB (ref 65–99)
Glucose-Capillary: 249 mg/dL — ABNORMAL HIGH (ref 65–99)
Glucose-Capillary: 273 mg/dL — ABNORMAL HIGH (ref 65–99)
Glucose-Capillary: 298 mg/dL — ABNORMAL HIGH (ref 65–99)
Glucose-Capillary: 327 mg/dL — ABNORMAL HIGH (ref 65–99)

## 2016-08-07 LAB — BLOOD GAS, ARTERIAL
ACID-BASE DEFICIT: 0.9 mmol/L (ref 0.0–2.0)
BICARBONATE: 22.5 mmol/L (ref 20.0–28.0)
DELIVERY SYSTEMS: POSITIVE
Drawn by: 246101
EXPIRATORY PAP: 8
FIO2: 60
INSPIRATORY PAP: 18
Mode: POSITIVE
O2 Saturation: 93.5 %
PH ART: 7.451 — AB (ref 7.350–7.450)
Patient temperature: 98.6
pCO2 arterial: 32.8 mmHg (ref 32.0–48.0)
pO2, Arterial: 63.7 mmHg — ABNORMAL LOW (ref 83.0–108.0)

## 2016-08-07 LAB — CBC
HEMATOCRIT: 45 % (ref 39.0–52.0)
HEMOGLOBIN: 15.6 g/dL (ref 13.0–17.0)
MCH: 31.3 pg (ref 26.0–34.0)
MCHC: 34.7 g/dL (ref 30.0–36.0)
MCV: 90.4 fL (ref 78.0–100.0)
Platelets: 63 10*3/uL — ABNORMAL LOW (ref 150–400)
RBC: 4.98 MIL/uL (ref 4.22–5.81)
RDW: 14 % (ref 11.5–15.5)
WBC: 5 10*3/uL (ref 4.0–10.5)

## 2016-08-07 LAB — HEMOGLOBIN A1C
HEMOGLOBIN A1C: 12 % — AB (ref 4.8–5.6)
Mean Plasma Glucose: 298 mg/dL

## 2016-08-07 LAB — HIV ANTIBODY (ROUTINE TESTING W REFLEX): HIV Screen 4th Generation wRfx: NONREACTIVE

## 2016-08-07 LAB — STREP PNEUMONIAE URINARY ANTIGEN: Strep Pneumo Urinary Antigen: NEGATIVE

## 2016-08-07 MED ORDER — SODIUM CHLORIDE 0.9 % IV SOLN
INTRAVENOUS | Status: DC
  Administered 2016-08-07 – 2016-08-09 (×3): via INTRAVENOUS

## 2016-08-07 MED ORDER — FUROSEMIDE 10 MG/ML IJ SOLN
INTRAMUSCULAR | Status: AC
  Filled 2016-08-07: qty 4

## 2016-08-07 MED ORDER — INSULIN GLARGINE 100 UNIT/ML ~~LOC~~ SOLN: 15.0000 [IU] | Freq: Every day | SUBCUTANEOUS | Status: DC

## 2016-08-07 MED ORDER — VANCOMYCIN HCL 10 G IV SOLR
1250.0000 mg | Freq: Two times a day (BID) | INTRAVENOUS | Status: DC
  Administered 2016-08-07 – 2016-08-09 (×4): 1250 mg via INTRAVENOUS
  Filled 2016-08-07 (×5): qty 1250

## 2016-08-07 MED ORDER — INSULIN ASPART 100 UNIT/ML ~~LOC~~ SOLN
0.0000 [IU] | SUBCUTANEOUS | Status: DC
  Administered 2016-08-07: 2 [IU] via SUBCUTANEOUS
  Administered 2016-08-07: 15 [IU] via SUBCUTANEOUS
  Administered 2016-08-07: 2 [IU] via SUBCUTANEOUS
  Administered 2016-08-07: 7 [IU] via SUBCUTANEOUS
  Administered 2016-08-08 (×2): 4 [IU] via SUBCUTANEOUS
  Administered 2016-08-08 (×2): 7 [IU] via SUBCUTANEOUS
  Administered 2016-08-08: 2 [IU] via SUBCUTANEOUS
  Administered 2016-08-09: 4 [IU] via SUBCUTANEOUS
  Administered 2016-08-09: 20 [IU] via SUBCUTANEOUS
  Administered 2016-08-09 (×4): 7 [IU] via SUBCUTANEOUS
  Administered 2016-08-10: 15 [IU] via SUBCUTANEOUS
  Administered 2016-08-10: 20 [IU] via SUBCUTANEOUS
  Administered 2016-08-10: 11 [IU] via SUBCUTANEOUS
  Administered 2016-08-10 (×2): 7 [IU] via SUBCUTANEOUS
  Administered 2016-08-10: 11 [IU] via SUBCUTANEOUS
  Administered 2016-08-10 – 2016-08-11 (×3): 7 [IU] via SUBCUTANEOUS
  Administered 2016-08-11 (×2): 4 [IU] via SUBCUTANEOUS
  Administered 2016-08-11 – 2016-08-12 (×2): 20 [IU] via SUBCUTANEOUS
  Administered 2016-08-12: 11 [IU] via SUBCUTANEOUS
  Administered 2016-08-12: 4 [IU] via SUBCUTANEOUS
  Administered 2016-08-12: 7 [IU] via SUBCUTANEOUS
  Administered 2016-08-12: 3 [IU] via SUBCUTANEOUS
  Administered 2016-08-12: 4 [IU] via SUBCUTANEOUS
  Administered 2016-08-13 (×2): 3 [IU] via SUBCUTANEOUS

## 2016-08-07 MED ORDER — IPRATROPIUM-ALBUTEROL 0.5-2.5 (3) MG/3ML IN SOLN
3.0000 mL | Freq: Four times a day (QID) | RESPIRATORY_TRACT | Status: DC
  Administered 2016-08-07 – 2016-08-11 (×20): 3 mL via RESPIRATORY_TRACT
  Filled 2016-08-07 (×18): qty 3

## 2016-08-07 MED ORDER — VANCOMYCIN HCL 10 G IV SOLR
1500.0000 mg | Freq: Once | INTRAVENOUS | Status: AC
  Administered 2016-08-07: 1500 mg via INTRAVENOUS
  Filled 2016-08-07: qty 1500

## 2016-08-07 MED ORDER — SODIUM CHLORIDE 0.9 % IV SOLN: INTRAVENOUS | Status: DC

## 2016-08-07 MED ORDER — FUROSEMIDE 10 MG/ML IJ SOLN
40.0000 mg | Freq: Once | INTRAMUSCULAR | Status: AC
  Administered 2016-08-07: 40 mg via INTRAVENOUS

## 2016-08-07 MED ORDER — SODIUM CHLORIDE 0.9 % IV BOLUS (SEPSIS)
1000.0000 mL | Freq: Once | INTRAVENOUS | Status: AC
  Administered 2016-08-07: 1000 mL via INTRAVENOUS

## 2016-08-07 MED ORDER — ORAL CARE MOUTH RINSE
15.0000 mL | Freq: Two times a day (BID) | OROMUCOSAL | Status: DC
  Administered 2016-08-07 – 2016-08-21 (×23): 15 mL via OROMUCOSAL

## 2016-08-07 MED ORDER — PIPERACILLIN-TAZOBACTAM 3.375 G IVPB
3.3750 g | Freq: Three times a day (TID) | INTRAVENOUS | Status: DC
  Administered 2016-08-07 – 2016-08-09 (×8): 3.375 g via INTRAVENOUS
  Filled 2016-08-07 (×9): qty 50

## 2016-08-07 NOTE — Consult Note (Signed)
PCCM Consult Note  Admission date: 08/06/2016 Consult date: 08/07/2016 Referring provider: Dr. Ladona Ridgel, [facility]  CC: Short of breath  HPI: Asked by Dr. Ladona Ridgel with IMTS to assess for dyspnea.  61 yo male smoker presented with 3 days of cough, fever 101F and fatigue.  Found to have influenza A.  Had progressive hypoxia and transitioned to Bipap.  CXR shows progressive infiltrates.  He had elevated lactic acid.  He denies sinus congestion, sore throat, headache, abdominal pain, nausea, diarrhea, skin rash.  He feels his chest is tight but seems better since being started on Bipap.  He  has a past medical history of Anemia; Anxiety; Bipolar disorder (HCC); Bronchial asthma; Depression; Diabetes mellitus without complication (HCC); DVT (deep vein thrombosis) in pregnancy (HCC); Esophageal varices (HCC); GERD (gastroesophageal reflux disease); History of blood transfusion; Hypercholesteremia; Liver cirrhosis secondary to NASH (HCC); Non-alcoholic cirrhosis (HCC); Peripheral neuropathy; Schizophrenia (HCC); and Thrombocytopenia (HCC).  He  has a past surgical history that includes IVC FILTER PLACEMENT ([facility] HX); Finger surgery (Right, 1990s?); US GUIDED PARACENTESIS ([facility] HX); Esophagogastroduodenoscopy (egd) with propofol (Left, 01/21/2016); Cataract extraction w/ intraocular lens implant (Right); and Cardiac catheterization.  His family history is significant for his mother who has heart failure.  He  reports that he has been smoking Cigarettes.  He has a 1.25 pack-year smoking history. He has never used smokeless tobacco. He reports that he does not drink alcohol or use drugs.   Allergies  Allergen Reactions  . Duloxetine Hcl Other (See Comments)    No current facility-administered medications on file prior to encounter.    Current Outpatient Prescriptions on File Prior to Encounter  Medication Sig  . acetaminophen (TYLENOL) 500 MG tablet Take 1,500 mg by mouth every 6 (six) hours as needed  for mild pain.  . cetirizine (ZYRTEC) 10 MG tablet Take 10 mg by mouth daily.  . cyanocobalamin 500 MCG tablet Take 500 mcg by mouth daily.  . folic acid (FOLVITE) 1 MG tablet Take 1 mg by mouth daily.  Marland Kitchen gabapentin (NEURONTIN) 300 MG capsule Take 900 mg by mouth at bedtime.  . insulin regular human CONCENTRATED (HUMULIN R) 500 UNIT/ML injection Inject 50-100 Units into the skin See admin instructions. 100 units in am, 50 units in afternoon, 100 units in pm  . nadolol (CORGARD) 20 MG tablet Take 20 mg by mouth daily.  . ondansetron (ZOFRAN) 8 MG tablet Take 8 mg by mouth every 8 (eight) hours as needed for nausea or vomiting.  . pantoprazole (PROTONIX) 40 MG tablet Take 40 mg by mouth 2 (two) times daily.   ROS: 12 point ROS negative except above  Subjective:  Vital signs: BP 106/76 (BP Location: Right Arm)   Pulse 96   Temp 99.8 F (37.7 C) (Axillary) Comment (Src): Has BiPAP on  Resp (!) 29   Ht  (1.778 m)   Wt 175 lb 8 oz (79.6 kg)   SpO2 94%   BMI 25.18 kg/m   Intake/output: I/O last 3 completed shifts: In: 914.7 [P.O.:600; I.V.:314.7] Out: 400 [Urine:400]  General: laying in bed Neuro: alert, normal strength, CN intact HEENT: pupils reactive, Bipap mask on, no LAN Cardiac: regular, no murmur Chest: coarse BS b/l, no wheeze Abd: soft, non tender, +bowel sounds Ext: no edema Skin: no rashes   CMP Latest Ref Rng & Units 08/07/2016 08/06/2016 08/06/2016  Glucose 65 - 99 mg/dL 161(W) 960(A) 540(J)  BUN 6 - 20 mg/dL Creatinine 0.61 - 1.24 mg/dL  0.90 0.75 0.66  Sodium 135 - 145 mmol/L 132(L) 133(L) 135  Potassium 3.5 - 5.1 mmol/L 4.3 4.4 4.2  Chloride 101 - 111 mmol/L 96(L) 100(L) 101  CO2 22 - 32 mmol/L Calcium 8.9 - 10.3 mg/dL 7.9(L) 8.2(L) 8.4(L)  Total Protein 6.5 - 8.1 g/dL 8.2(N) - -  Total Bilirubin 0.3 - 1.2 mg/dL 5.6(O) - -  Alkaline Phos 38 - 126 U/L 67 - -  AST 15 - 41 U/L 72(H) - -  ALT 17 - 63 U/L 29 - -     CBC Latest Ref  Rng & Units 08/07/2016 08/06/2016 01/22/2016  WBC 4.0 - 10.5 K/uL 5.0 6.5 4.3  Hemoglobin 13.0 - 17.0 g/dL 13.0 86.5 12.5(L)  Hematocrit 39.0 - 52.0 % 45.0 45.7 36.4(L)  Platelets 150 - 400 K/uL 63(L) 55(L) 67(L)     ABG    Component Value Date/Time   HCO3 21.3 01/19/2016 2201   TCO2 22 01/19/2016 2201   ACIDBASEDEF 1.0 01/19/2016 2201   O2SAT 99.0 01/19/2016 2201     CBG (last 3)   Recent Labs  08/07/16 0013 08/07/16 0414 08/07/16 0757  GLUCAP 273* 278* 298*     Imaging: Dg Chest 2 View  Result Date: 08/06/2016 CLINICAL DATA:  Shortness of breath, productive cough EXAM: CHEST  2 VIEW COMPARISON:  08/06/2016 FINDINGS: No large pleural effusion. There is mild cardiomegaly. Diffuse interstitial opacities. More peripheral focal airspace opacities in the left mid lung. No pneumothorax. IMPRESSION: 1. Cardiomegaly 2. Diffuse interstitial opacities could reflect interstitial inflammation or edema ; there is more focal airspace opacity in the periphery of the left lung which may reflect pneumonia. Electronically Signed   By: Jasmine Pang M.D.   On: 08/06/2016 23:23   Dg Chest 2 View  Result Date: 08/06/2016 CLINICAL DATA:  Cough, congestion, fever, vomiting, diarrhea EXAM: CHEST  2 VIEW COMPARISON:  01/19/2016 FINDINGS: Cardiomediastinal silhouette is stable. No infiltrate or pleural effusion. No pulmonary edema. Mild hyperinflation. Mild degenerative changes thoracic spine. IMPRESSION: No active cardiopulmonary disease. Mild hyperinflation. Mild degenerative changes thoracic spine. Electronically Signed   By: Natasha Mead M.D.   On: 08/06/2016 08:58     Studies: Echo 01/20/16 >> EF 40 to 45%  Antibiotics: Rocephin 4/17 >> 4/17 Zithromax 4/17 >> 4/17 Tamiflu 4/17 >> Vancomycin 4/17 >> Zosyn 4/17 >>  Cultures: Blood 4/17 >> Influenza PCR 4/17 >> Influenza A positive Pneumococcal Ag 4/18 >> Legionella Ag 4/18 >>  Lines/tubes:  Events: 4/17 Admit 4/18 To  ICU  Summary: 61 yo male smoker with acute hypoxic respiratory failure and sepsis from influenza A pneumonia.    Assessment/plan:  Acute hypoxic respiratory failure 2nd to Influenza A pneumonia with possible bacterial superinfection Tobacco abuse. - oxygen to keep SpO2 > 92% - continue Bipap - move to ICU >> might need intubation if he gets worse - Day 2 of abx >> changed to vancomycin, zosyn 4/18 - f/u CXR - scheduled BDs - bronchial hygiene  Sepsis with lactic acidosis. - continue IV fluids  Chronic systolic CHF. - monitor hemodynamics  Hx of NASH with cirrhosis and varices. Hx of GERD. - f/u LFTs - continue corgard - protonix  DM type II with neuropathy. - SSI - neurontin  Thrombocytopenia in setting of cirrhosis. - f/u CBC  DVT prophylaxis - SCDs SUP - protonix Nutrition - NPO with ice chips  Goals of care - full code  CC time 42 minutes  Coralyn Helling, MD [facility] [address] Pulmonary/Critical  Care 08/07/2016, 8:42 AM Pager:  [phone] After 3pm call: [phone]

## 2016-08-07 NOTE — Progress Notes (Signed)
Subjective: Patient was evaluated this morning on rounds. Overnight patient's oxygen saturations dropped and required the use of bipap.  He is currently on bipap and satting 92%.  He is able to converse and follow commands. He states his chest feels tight.     Objective:  Vital signs in last 24 hours: Vitals:   08/07/16 0509 08/07/16 0758 08/07/16 0910 08/07/16 0916  BP:  106/76    Pulse: 84 96 87   Resp: (!) 30 (!) 29 (!) 37   Temp:  99.8 F (37.7 C)    TempSrc:  Axillary    SpO2: 95% 94% 92% 92%  Weight:      Height:       Physical Exam  Constitutional:  Mild distress  Cardiovascular: Normal rate, regular rhythm and normal heart sounds.   Pulmonary/Chest: Effort normal and breath sounds normal. He has no wheezes.  Abdominal: Soft. He exhibits no distension. There is no tenderness.  Neurological: He is alert.  Skin: Skin is warm and dry.     Assessment/Plan:  Principal Problem:   Influenza A Active Problems:   Diabetes (HCC)   Esophageal varices in cirrhosis (HCC)   Tobacco abuse   GERD (gastroesophageal reflux disease)   Liver cirrhosis secondary to NASH (HCC)   Thrombocytopenia (HCC)  Influenza A Patient was started on Tamiflu yesterday.  Patient was febrile overnight with no leukocytosis.  On admission yesterday morning chest x-ray showed no active cardiopulmonary disease. Around 7:20 PM yesterday patient had increased shortness of breath and on physical exam was noted to have left sided rhonchi. Chest x-ray yesterday evening showed diffuse interstitial opacities and focal airspace opacity in the left lung which may reflect pneumonia.  I'm concerned patient has a post viral pneumonia vs aspiration pneumonia or pneumonitis.  He received a dose of ceftriaxone and azithromycin yesterday.  We will broaden to vancomycin and zosyn today. - Vancomycin -Zosyn -bipap - tamiflu  Acute Hypoxic Respiratory Failure 2/2 complications of influenza A Overnight patient's  respiratory status declined requiring bipap for support.  Patient's abg this morning on bipap of FiO2 of 60 shows pH 7.4, pCO2 32 and pO2 63.7.   Patient's repeat Chest x-ray yesterday evening showed interstitial opacities that could reflect pulmonary edema.  LV EF in 2017 was 40-45%.  Patient received a dose of lasix  IV and had 400cc output with improvement in his breathing.  However, he is still requiring bipap.  I think his acute change is more likely aspiration pneumonitis or pneumonia vs post viral pneumonia.  Will broaden antibiotics.  PCCM was consulted and will transfer to the ICU today as he may require intubation if can no longer tolerate bipap. - PCCM consult and transfer to ICU  Diabetic Ketoacidosis On arrival patient had blood glucose levels in the 500s, ketones in urine and increased anion gap. Since admission patient has been transitioned off the insulin drip, gap closed times two and transition to subQ insulin. CBGs have been in the 200s. Increased Lantus to 15 units at night. -Lantus 15 units QHS - SSI with meals  Lactic Acidosis On arrival lactic acid 3.8.  Trending down, currently 2.3.  Will receive IV fluids in the ICU.  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  Hemoglobin stable at 15 -Continue nadolol 200 mg QD -Continue protonix 40 mg QD  Thrombocytopenia Platelets 63 and stable. Likely secondary to cirrhosis. No evidence of bleeding. -Monitor -SCDs   Dispo: Anticipated  discharge pending clinical improvement.   Camelia Phenes, DO 08/07/2016, 10:02 AM Pager: [phone]

## 2016-08-07 NOTE — Progress Notes (Signed)
I was paged that the patient was not holding his sats on 100% non-rebreather. He was hiaving increased SOB and had what seems like a single episode of blood streaked emisis. Patient was placed on Bi-Pap. His sats improved to 100%. Pt states he was feeling better and breathing more comfortably. Examination showed increased crackles on the Left compared with the right. IV lasix was ordered  x 1. Will contimue to monitor and try to wean from Bi-Pap as able following Lasix.

## 2016-08-07 NOTE — Progress Notes (Signed)
Inpatient Diabetes Program Recommendations  AACE/ADA: New Consensus Statement on Inpatient Glycemic Control (2015)  Target Ranges:  Prepandial:   less than 140 mg/dL      Peak postprandial:   less than 180 mg/dL (1-2 hours)      Critically ill patients:  140 - 180 mg/dL  Results for Wayne Benson, Wayne Benson (MRN [id number]) as of 08/07/2016 12:38  Ref. Range 08/06/2016 10:50 08/06/2016 12:11 08/06/2016 13:18 08/06/2016 14:26 08/06/2016 15:28 08/06/2016 16:31 08/06/2016 17:36 08/06/2016 18:36 08/06/2016 22:00 08/07/2016 00:13 08/07/2016 04:14 08/07/2016 07:57 08/07/2016 11:31  Glucose-Capillary Latest Ref Range: 65 - 99 mg/dL 409 (H) 811 (H) 914 (H) 222 (H) 192 (H) 161 (H) 156 (H) 142 (H) 194 (H) 273 (H) 278 (H) 298 (H) 249 (H)   Results for Wayne Benson, Wayne Benson (MRN [id number]) as of 08/07/2016 12:38  Ref. Range 01/20/2016 08:25 08/06/2016 08:06  Hemoglobin A1C Latest Ref Range: 4.8 - 5.6 % 14.4 (H) 12.0 (H)   Review of Glycemic Control  Diabetes history: DM2 Outpatient Diabetes medications: Humulin R U500 100 units with breakfast, 50 units with lunch, 100 units with supper Current orders for Inpatient glycemic control: Novolog 0-20 units Q4H  Inpatient Diabetes Program Recommendations: Insulin - Basal: Please consider ordering Lantus 20 units Q24H. A1C A1C 12% on 08/06/16 indicating an average glucose of 298 mg/dl over the past 2-3 months.  NOTE: In reviewing chart, noted patient was on an insulin drip and was given Lantus 10 units at 17:13 on 08/06/16 prior to transitioning to SQ insulin and no orders for basal insulin currently ordered.  Thanks, Orlando Penner, RN, MSN, CDE Diabetes Coordinator Inpatient Diabetes Program [phone] (Team Pager from 8am to 5pm)

## 2016-08-07 NOTE — Progress Notes (Signed)
Arrived into room 51m07 with bipap on. sats 93 percent, bp 92/60, following simple commands, but sleepy.

## 2016-08-07 NOTE — Progress Notes (Signed)
RN called for SOB and increase WOB. Pt placed on BiPap and MD at bedside prior to arrival no intervention from RRT

## 2016-08-07 NOTE — Progress Notes (Signed)
Placed patient on BIPAP due to increased work of breathing and decrease is SP02. IPAP set at 14cm and EPAP set at 7cm and oxygen set at 100%. B/S decreased in lower lobes with fine crackles. SP02 increased to 97% with respiratory rate 28-35bpm. Doctor at bedside.

## 2016-08-07 NOTE — Progress Notes (Signed)
Initial Nutrition Assessment  DOCUMENTATION CODES:   Non-severe (moderate) malnutrition in context of acute illness/injury  INTERVENTION:    If intubation required, recommend start TF.  When diet advanced, RD to add PO supplements as needed.  NUTRITION DIAGNOSIS:   Malnutrition (Moderate) related to acute illness (influenza A) as evidenced by mild depletion of body fat, mild depletion of muscle mass.  GOAL:   Patient will meet greater than or equal to 90% of their needs  MONITOR:   Diet advancement, PO intake, Labs, I & O's  REASON FOR ASSESSMENT:   Malnutrition Screening Tool    ASSESSMENT:   61 yo male smoker presented with 3 days of cough, fever 101F and fatigue.  Found to have influenza A.    Patient developed progressive hypoxia this morning, now requiring BiPAP. Plans to transfer to ICU, may require intubation.  Patient unable to provide and nutrition history at this time. Nutrition-Focused physical exam completed. Findings are mild fat depletion, mild-moderate muscle depletion, and no edema.  Weight has been stable per chart review since October 2017. Labs reviewed: sodium 132 (L) CBG's: [phone] Medications reviewed and include Lasix.  Diet Order:  Diet NPO time specified Except for: Ice Chips, Sips with Meds  Skin:  Reviewed, no issues  Last BM:  4/17  Height:   Ht Readings from Last 1 Encounters:  08/06/16  (1.778 m)    Weight:   Wt Readings from Last 1 Encounters:  08/06/16 175 lb 8 oz (79.6 kg)    Ideal Body Weight:  75.5 kg  BMI:  Body mass index is 25.18 kg/m.  Estimated Nutritional Needs:   Kcal:  2200-2400  Protein:  100-120 gm  Fluid:  2.2-2.4 L  EDUCATION NEEDS:   No education needs identified at this time  Joaquin Courts, RD, LDN, CNSC Pager [phone] After Hours Pager [phone]

## 2016-08-07 NOTE — Progress Notes (Signed)
Pharmacy Antibiotic Note  Wayne Benson is a 61 y.o. male admitted on 08/06/2016 with cough/SOB/PNA.  Pharmacy has been consulted for Vancomycin and Zosyn  dosing.  Plan: Vancomycin 1500 mg IV now, then 1250 mg IV q12h Zosyn 3.375 g IV q8h   Height:  (177.8 cm) Weight: 175 lb 8 oz (79.6 kg) IBW/kg (Calculated) : 73  Temp (24hrs), Avg:100.4 F (38 C), Min:99.2 F (37.3 C), Max:101.7 F (38.7 C)   Recent Labs Lab 08/06/16 0806 08/06/16 0841 08/06/16 1120 08/06/16 1125 08/06/16 1510 08/06/16 2022 08/06/16 2329 08/07/16 0411  WBC 6.5  --   --   --   --   --   --  5.0  CREATININE 0.88  --  0.79  --  0.70 0.66 0.75 0.90  LATICACIDVEN  --  3.88*  --  2.50*  --   --   --  2.3*    Estimated Creatinine Clearance: 89 mL/min (by C-G formula based on SCr of 0.9 mg/dL).    Allergies  Allergen Reactions  . Duloxetine Hcl Other (See Comments)     Eddie Candle 08/07/2016 7:33 AM

## 2016-08-07 NOTE — Progress Notes (Signed)
Pt being transferred to [id number]-07 per Dr. Craige Cotta. Reprot called to charge RN. Pt. Will tranfer on Bipap.

## 2016-08-08 ENCOUNTER — Inpatient Hospital Stay (HOSPITAL_COMMUNITY): Payer: No Typology Code available for payment source

## 2016-08-08 DIAGNOSIS — J101 Influenza due to other identified influenza virus with other respiratory manifestations: Secondary | ICD-10-CM

## 2016-08-08 LAB — GLUCOSE, CAPILLARY
GLUCOSE-CAPILLARY: 191 mg/dL — AB (ref 65–99)
GLUCOSE-CAPILLARY: 183 mg/dL — AB (ref 65–99)
GLUCOSE-CAPILLARY: 200 mg/dL — AB (ref 65–99)
Glucose-Capillary: 163 mg/dL — ABNORMAL HIGH (ref 65–99)
Glucose-Capillary: 213 mg/dL — ABNORMAL HIGH (ref 65–99)
Glucose-Capillary: 206 mg/dL — ABNORMAL HIGH (ref 65–99)

## 2016-08-08 LAB — COMPREHENSIVE METABOLIC PANEL
ALT: 24 U/L (ref 17–63)
ANION GAP: 11 (ref 5–15)
AST: 61 U/L — ABNORMAL HIGH (ref 15–41)
Albumin: 2.3 g/dL — ABNORMAL LOW (ref 3.5–5.0)
Alkaline Phosphatase: 53 U/L (ref 38–126)
BILIRUBIN TOTAL: 1.6 mg/dL — AB (ref 0.3–1.2)
BUN: 21 mg/dL — AB (ref 6–20)
CHLORIDE: 105 mmol/L (ref 101–111)
CO2: 21 mmol/L — ABNORMAL LOW (ref 22–32)
Calcium: 7.7 mg/dL — ABNORMAL LOW (ref 8.9–10.3)
Creatinine, Ser: 0.65 mg/dL (ref 0.61–1.24)
Glucose, Bld: 151 mg/dL — ABNORMAL HIGH (ref 65–99)
POTASSIUM: 4 mmol/L (ref 3.5–5.1)
Sodium: 137 mmol/L (ref 135–145)
TOTAL PROTEIN: 5.1 g/dL — AB (ref 6.5–8.1)

## 2016-08-08 LAB — CBC
HEMATOCRIT: 43.4 % (ref 39.0–52.0)
HEMOGLOBIN: 15.4 g/dL (ref 13.0–17.0)
MCH: 32.2 pg (ref 26.0–34.0)
MCHC: 35.5 g/dL (ref 30.0–36.0)
MCV: 90.6 fL (ref 78.0–100.0)
Platelets: 43 10*3/uL — ABNORMAL LOW (ref 150–400)
RBC: 4.79 MIL/uL (ref 4.22–5.81)
RDW: 14.1 % (ref 11.5–15.5)
WBC: 4.2 10*3/uL (ref 4.0–10.5)

## 2016-08-08 LAB — TROPONIN I: Troponin I: <0.03 ng/mL (ref <0.03)

## 2016-08-08 LAB — PROCALCITONIN: PROCALCITONIN: 0.95 ng/mL

## 2016-08-08 MED ORDER — METHYLPREDNISOLONE SODIUM SUCC 125 MG IJ SOLR
60.0000 mg | Freq: Three times a day (TID) | INTRAMUSCULAR | Status: DC
  Administered 2016-08-08 – 2016-08-10 (×7): 60 mg via INTRAVENOUS
  Filled 2016-08-08 (×6): qty 0.96
  Filled 2016-08-08 (×2): qty 2

## 2016-08-08 NOTE — Progress Notes (Signed)
Patient taken off of BiPAP and placed on 10 LPM HFNC to attempt and give patient a break off of V60. Patient had desaturation to 85%. Liter flow increased to 15 LPM with little to no improvement in SpO2. Placed back on BiPAP at 18/8 and 50%.  Wayne Benson

## 2016-08-08 NOTE — Consult Note (Signed)
PCCM Consult Note  Admission date: 08/06/2016 Consult date: 08/07/2016 Referring provider: Dr. Ladona Ridgel, IMTS  CC: Short of breath  HPI: Asked by Dr. Ladona Ridgel with IMTS to assess for dyspnea. 61 yo male smoker presented with 3 days of cough, fever 101F and fatigue.  Found to have influenza A.  Had progressive hypoxia and transitioned to Bipap.  CXR shows progressive infiltrates.  He had elevated lactic acid. He denies sinus congestion, sore throat, headache, abdominal pain, nausea, diarrhea, skin rash.  He feels his chest is tight but seems better since being started on Bipap.  He  has a past medical history of Anemia; Anxiety; Bipolar disorder (HCC); Bronchial asthma; Depression; Diabetes mellitus without complication (HCC); DVT (deep vein thrombosis) in pregnancy (HCC); Esophageal varices (HCC); GERD (gastroesophageal reflux disease); History of blood transfusion; Hypercholesteremia; Liver cirrhosis secondary to NASH (HCC); Non-alcoholic cirrhosis (HCC); Peripheral neuropathy; Schizophrenia (HCC); and Thrombocytopenia (HCC).  He  has a past surgical history that includes IVC FILTER PLACEMENT ([facility] HX); Finger surgery (Right, 1990s?); US GUIDED PARACENTESIS ([facility] HX); Esophagogastroduodenoscopy (egd) with propofol (Left, 01/21/2016); Cataract extraction w/ intraocular lens implant (Right); and Cardiac catheterization.  Subjective: Breathing is better, thirsty.   Vital signs: BP 124/74   Pulse 77   Temp 98.6 F (37 C) (Oral)   Resp (!) 24   Ht  (1.778 m)   Wt 182 lb 15.7 oz (83 kg)   SpO2 91%   BMI 26.26 kg/m   Intake/output: I/O last 3 completed shifts: In: 1992.5 [P.O.:360; I.V.:1595; IV Piggyback:37.5] Out: 1100 [Urine:1100]  General: laying in bed  Neuro: alert, normal strength, CN intact HEENT: pupils reactive, Bipap mask on Cardiac: regular, no murmur Chest: coarse BS b/l with some mild crackles. No wheezing. Good air movement.  Abd: soft, non tender, +bowel sounds Ext:  no edema Skin: no rashes   CMP Latest Ref Rng & Units 08/08/2016 08/07/2016 08/06/2016  Glucose 65 - 99 mg/dL 366(Y) 403(K) 742(V)  BUN 6 - 20 mg/dL 95(G) 19 16  Creatinine 0.61 - 1.24 mg/dL 3.87 5.64 3.32  Sodium 135 - 145 mmol/L 137 132(L) 133(L)  Potassium 3.5 - 5.1 mmol/L 4.0 4.3 4.4  Chloride 101 - 111 mmol/L 105 96(L) 100(L)  CO2 22 - 32 mmol/L 21(L) 26 24  Calcium 8.9 - 10.3 mg/dL 7.7(L) 7.9(L) 8.2(L)  Total Protein 6.5 - 8.1 g/dL 5.1(L) 5.8(L) -  Total Bilirubin 0.3 - 1.2 mg/dL 9.5(J) 8.8(C) -  Alkaline Phos 38 - 126 U/L 53 67 -  AST 15 - 41 U/L 61(H) 72(H) -  ALT 17 - 63 U/L 24 29 -     CBC Latest Ref Rng & Units 08/07/2016 08/06/2016 01/22/2016  WBC 4.0 - 10.5 K/uL 5.0 6.5 4.3  Hemoglobin 13.0 - 17.0 g/dL 16.6 06.3 12.5(L)  Hematocrit 39.0 - 52.0 % 45.0 45.7 36.4(L)  Platelets 150 - 400 K/uL 63(L) 55(L) 67(L)     ABG    Component Value Date/Time   PHART 7.451 (H) 08/07/2016 0920   PCO2ART 32.8 08/07/2016 0920   PO2ART 63.7 (L) 08/07/2016 0920   HCO3 22.5 08/07/2016 0920   TCO2 22 01/19/2016 2201   ACIDBASEDEF 0.9 08/07/2016 0920   O2SAT 93.5 08/07/2016 0920     CBG (last 3)   Recent Labs  08/07/16 2322 08/08/16 0319 08/08/16 0743  GLUCAP 144* 163* 191*     Imaging: Dg Chest 2 View  Result Date: 08/06/2016 CLINICAL DATA:  Shortness of breath, productive cough EXAM: CHEST  2 VIEW  COMPARISON:  08/06/2016 FINDINGS: No large pleural effusion. There is mild cardiomegaly. Diffuse interstitial opacities. More peripheral focal airspace opacities in the left mid lung. No pneumothorax. IMPRESSION: 1. Cardiomegaly 2. Diffuse interstitial opacities could reflect interstitial inflammation or edema ; there is more focal airspace opacity in the periphery of the left lung which may reflect pneumonia. Electronically Signed   By: Jasmine Pang M.D.   On: 08/06/2016 23:23   Dg Chest 2 View  Result Date: 08/06/2016 CLINICAL DATA:  Cough, congestion, fever, vomiting,  diarrhea EXAM: CHEST  2 VIEW COMPARISON:  01/19/2016 FINDINGS: Cardiomediastinal silhouette is stable. No infiltrate or pleural effusion. No pulmonary edema. Mild hyperinflation. Mild degenerative changes thoracic spine. IMPRESSION: No active cardiopulmonary disease. Mild hyperinflation. Mild degenerative changes thoracic spine. Electronically Signed   By: Natasha Mead M.D.   On: 08/06/2016 08:58   Dg Chest Port 1 View  Result Date: 08/08/2016 CLINICAL DATA:  Respiratory failure. EXAM: PORTABLE CHEST 1 VIEW COMPARISON:  08/06/2016. FINDINGS: Stable fullness of the upper mediastinum is again noted, this may be secondary to prominent great vessels and portal AP technique . A follow-up PA lateral chest x-ray suggested. Cardiomegaly with slight progression of bilateral interstitial prominence consistent with CHF. Bilateral pneumonitis cannot be excluded. Developing alveolar infiltrates in right upper lobe and left lung base are noted. No pleural effusion or pneumothorax. Right costophrenic angle is not imaged. IMPRESSION: 1. Stable fullness of the upper mediastinum is again noted, this may be secondary to prominent great vessels and portable AP technique. A follow-up PA and lateral chest x-ray suggested. 2. Cardiomegaly with slight progression of bilateral interstitial prominence consistent CHF. 3. Developing alveolar infiltrates in the right upper lobe and left lung base are noted. Pneumonia cannot be excluded. Electronically Signed   By: Maisie Fus  Register   On: 08/08/2016 06:46     Studies: Echo 01/20/16 >> EF 40 to 45% CXR 4/18 and 4/19 b/l infiltrates.  Antibiotics: Rocephin 4/17 >> 4/17 Zithromax 4/17 >> 4/17 Tamiflu 4/17 >> Vancomycin 4/17 >> Zosyn 4/17 >>  Cultures: Blood 4/17 >> Influenza PCR 4/17 >> Influenza A positive Pneumococcal Ag 4/18 >> Legionella Ag 4/18 >>  Lines/tubes:  Events: 4/17 Admit 4/18 To ICU for resp failure failing bipap.   Summary: 61 yo male smoker with acute  hypoxic respiratory failure and sepsis from influenza A pneumonia.    Assessment/plan:  PULMONARY A: Acute hypoxic respiratory failure 2nd to Influenza A pneumonia with possible bacterial superinfection Tobacco abuse. ALI P:   oxygen to keep SpO2 > 92%m, continue Bipap Consider de-escalating abx scheduled BDs  CARDIOVASCULAR A:  Sepsis with lactic acidosis. Chronic systolic CHF. Cirrhosis with varices P: Cont nadolol, appears euvolemic, no lasix. Cont IVF  RENAL A:   Stable, no issues P:   Consider lower volume in am  bmet in am   GASTROINTESTINAL A:   NASH with cirrhosis, with esophageal varices with bleeding in the past HLD P:   Cont nadolol, PPI  HEMATOLOGIC A:   DVT with IVC filter Thrombocytopenia likely from cirrhosis  P:  SCD, monitor CBC  INFECTIOUS A:   Influenza A with possible bacterial super infection P:   Cont tamiflu, must take this Narrow vanc and zosyn to ceftx? In am  Pct assessment  ENDOCRINE A:   DM II with polyneuropathy P:   SSI, neurontin  NEUROLOGIC A:   Stable, At risk for hepatic encephalopathy P:   RASS goal: 1   Hyacinth Meeker, MD.  STAFF NOTELeland Her  Tyson Alias, MD FACP have personally reviewed patient's available data, including medical history, events of note, physical examination and test results as part of my evaluation. I have discussed with resident/NP and other care providers such as pharmacist, RN and RRT. In addition, I personally evaluated patient and elicited key findings of: awake, follows commands, jvd wnl or low, lung sounds reduced and coarse, no sig labored, no edema, clinical c/w ARF , ALI, flu pneumonitis, r/o super infection, requires NIMV, will have to have interruption 20 min q4h, may need ett, allow slight pos balance, lactic noted, may need cvl and cvp assessment, some pvc, get trop, ecg, maintain tamiflu, may in am narrow off to ctx  And dc Vosyn, pct assessments, NPO, plat noted, if to mech vent -  add sub q hep and follow trend, add steroids for pneumonits, I updated pt, he is a good candidate for [id number], would agree with enrollment, does not appear to have sig psychoactive dz The patient is critically ill with multiple organ systems failure and requires high complexity decision making for assessment and support, frequent evaluation and titration of therapies, application of advanced monitoring technologies and extensive interpretation of multiple databases.   Critical Care Time devoted to patient care services described in this note is 35 Minutes. This time reflects time of care of this signee: Rory Percy, MD FACP. This critical care time does not reflect procedure time, or teaching time or supervisory time of PA/NP/Med student/Med Resident etc but could involve care discussion time. Rest per NP/medical resident whose note is outlined above and that I agree with   Mcarthur Rossetti. Tyson Alias, MD, FACP Pgr: [phone] [place] [facility] 08/08/2016 9:21 AM

## 2016-08-09 ENCOUNTER — Inpatient Hospital Stay (HOSPITAL_COMMUNITY): Payer: No Typology Code available for payment source

## 2016-08-09 LAB — GLUCOSE, CAPILLARY
GLUCOSE-CAPILLARY: 232 mg/dL — AB (ref 65–99)
GLUCOSE-CAPILLARY: 249 mg/dL — AB (ref 65–99)
GLUCOSE-CAPILLARY: 364 mg/dL — AB (ref 65–99)
Glucose-Capillary: 201 mg/dL — ABNORMAL HIGH (ref 65–99)
Glucose-Capillary: 225 mg/dL — ABNORMAL HIGH (ref 65–99)
Glucose-Capillary: 243 mg/dL — ABNORMAL HIGH (ref 65–99)

## 2016-08-09 LAB — BASIC METABOLIC PANEL
ANION GAP: 12 (ref 5–15)
BUN: 23 mg/dL — ABNORMAL HIGH (ref 6–20)
CO2: 24 mmol/L (ref 22–32)
Calcium: 8.1 mg/dL — ABNORMAL LOW (ref 8.9–10.3)
Chloride: 103 mmol/L (ref 101–111)
Creatinine, Ser: 0.67 mg/dL (ref 0.61–1.24)
Glucose, Bld: 236 mg/dL — ABNORMAL HIGH (ref 65–99)
POTASSIUM: 3.8 mmol/L (ref 3.5–5.1)
SODIUM: 139 mmol/L (ref 135–145)

## 2016-08-09 LAB — C DIFFICILE QUICK SCREEN W PCR REFLEX
C Diff antigen: NEGATIVE
C Diff interpretation: NOT DETECTED
C Diff toxin: NEGATIVE

## 2016-08-09 LAB — CBC
HEMATOCRIT: 39.9 % (ref 39.0–52.0)
HEMOGLOBIN: 13.8 g/dL (ref 13.0–17.0)
MCH: 31.2 pg (ref 26.0–34.0)
MCHC: 34.6 g/dL (ref 30.0–36.0)
MCV: 90.3 fL (ref 78.0–100.0)
Platelets: 30 10*3/uL — ABNORMAL LOW (ref 150–400)
RBC: 4.42 MIL/uL (ref 4.22–5.81)
RDW: 14 % (ref 11.5–15.5)
WBC: 1.9 10*3/uL — AB (ref 4.0–10.5)

## 2016-08-09 LAB — OCCULT BLOOD X 1 CARD TO LAB, STOOL: Fecal Occult Bld: POSITIVE — AB

## 2016-08-09 LAB — PROCALCITONIN: PROCALCITONIN: 0.64 ng/mL

## 2016-08-09 LAB — PHOSPHORUS: PHOSPHORUS: 1.9 mg/dL — AB (ref 2.5–4.6)

## 2016-08-09 LAB — LEGIONELLA PNEUMOPHILA SEROGP 1 UR AG: L. pneumophila Serogp 1 Ur Ag: NEGATIVE

## 2016-08-09 LAB — MAGNESIUM: Magnesium: 2.1 mg/dL (ref 1.7–2.4)

## 2016-08-09 MED ORDER — INSULIN GLARGINE 100 UNIT/ML ~~LOC~~ SOLN
10.0000 [IU] | Freq: Every day | SUBCUTANEOUS | Status: DC
  Filled 2016-08-09 (×2): qty 0.1

## 2016-08-09 MED ORDER — PANTOPRAZOLE SODIUM 40 MG PO TBEC
40.0000 mg | DELAYED_RELEASE_TABLET | Freq: Two times a day (BID) | ORAL | Status: DC
  Administered 2016-08-09 – 2016-08-12 (×7): 40 mg via ORAL
  Filled 2016-08-09 (×8): qty 1

## 2016-08-09 MED ORDER — INSULIN GLARGINE 100 UNIT/ML ~~LOC~~ SOLN
5.0000 [IU] | Freq: Every day | SUBCUTANEOUS | Status: DC
  Administered 2016-08-09: 5 [IU] via SUBCUTANEOUS
  Filled 2016-08-09 (×2): qty 0.05

## 2016-08-09 MED ORDER — INSULIN GLARGINE 100 UNIT/ML ~~LOC~~ SOLN: 5.0000 [IU] | Freq: Every day | SUBCUTANEOUS | Status: DC

## 2016-08-09 MED ORDER — POTASSIUM PHOSPHATES 15 MMOLE/5ML IV SOLN
20.0000 mmol | Freq: Once | INTRAVENOUS | Status: AC
  Administered 2016-08-09: 20 mmol via INTRAVENOUS
  Filled 2016-08-09: qty 6.67

## 2016-08-09 MED ORDER — DEXTROSE 5 % IV SOLN
1.0000 g | INTRAVENOUS | Status: DC
  Administered 2016-08-09 – 2016-08-12 (×4): 1 g via INTRAVENOUS
  Filled 2016-08-09 (×5): qty 10

## 2016-08-09 MED ORDER — FUROSEMIDE 10 MG/ML IJ SOLN
40.0000 mg | Freq: Three times a day (TID) | INTRAMUSCULAR | Status: DC
  Administered 2016-08-09 – 2016-08-13 (×12): 40 mg via INTRAVENOUS
  Filled 2016-08-09 (×12): qty 4

## 2016-08-09 NOTE — Progress Notes (Signed)
Inpatient Diabetes Program Recommendations  AACE/ADA: New Consensus Statement on Inpatient Glycemic Control (2015)  Target Ranges:  Prepandial:   less than 140 mg/dL      Peak postprandial:   less than 180 mg/dL (1-2 hours)      Critically ill patients:  140 - 180 mg/dL   Lab Results  Component Value Date   GLUCAP 201 (H) 08/09/2016   HGBA1C 12.0 (H) 08/06/2016    Review of Glycemic ControlResults for CORRIE, BRANNEN (MRN [id number]) as of 08/09/2016 13:13  Ref. Range 08/08/2016 19:29 08/08/2016 23:12 08/09/2016 03:34 08/09/2016 07:46 08/09/2016 11:48  Glucose-Capillary Latest Ref Range: 65 - 99 mg/dL 409 (H) 811 (H) 914 (H) 225 (H) 201 (H)    Please consider increasing Lantus to 20 units daily.   Thanks, Beryl Meager, RN, BC-ADM Inpatient Diabetes Coordinator Pager [phone] (8a-5p)

## 2016-08-09 NOTE — Progress Notes (Addendum)
PULMONARY  / CRITICAL CARE MEDICINE  Name: Wayne Benson MRN: [id number] DOB: [birth year]    LOS: 3  REFERRING MD :  Earl Lagos, MD - IMTS   BRIEF PATIENT DESCRIPTION: Asked by Dr. Ladona Ridgel with IMTS to assess for dyspnea. 61 yo male smoker presented with 3 days of cough, fever 101F and fatigue.  Found to have influenza A.  Had progressive hypoxia and transitioned to Bipap.  CXR shows progressive infiltrates.  He had elevated lactic acid. He denies sinus congestion, sore throat, headache, abdominal pain, nausea, diarrhea, skin rash.  He feels his chest is tight but seems better since being started on Bipap  LINES / TUBES: PIV bil 4/17>>  CULTURES: Blood 4/17 >> Influenza PCR 4/17 >> Influenza A positive Pneumococcal Ag 4/18 >> negative Legionella Ag 4/18 >> C diff 4/19>> negative  ANTIBIOTICS: Rocephin 4/17 >> 4/17 Zithromax 4/17 >> 4/17 Tamiflu 4/17 >> Vancomycin 4/17 >> Zosyn 4/17 >>  SIGNIFICANT EVENTS:  4/17 Admit 4/18 To ICU for resp failure failing bipap. Echo 01/20/16 >> EF 40 to 45% CXR 4/18 and 4/19 b/l infiltrates.   PAST MEDICAL HISTORY :  Past Medical History:  Diagnosis Date  . Anemia   . Anxiety   . Bipolar disorder (HCC)   . Bronchial asthma    "get it just about q yr" (08/06/2016)  . Depression   . Diabetes mellitus without complication (HCC)   . DVT (deep vein thrombosis) in pregnancy (HCC)    RLE  . Esophageal varices (HCC)   . GERD (gastroesophageal reflux disease)   . History of blood transfusion    "related to cirrhosis"  . Hypercholesteremia   . Liver cirrhosis secondary to NASH (HCC)    hx/notes 08/06/2016  . Non-alcoholic cirrhosis (HCC)   . Peripheral neuropathy   . Schizophrenia (HCC)   . Thrombocytopenia (HCC)    Past Surgical History:  Procedure Laterality Date  . CARDIAC CATHETERIZATION     "@ the [place]":  Marland Kitchen CATARACT EXTRACTION W/ INTRAOCULAR LENS IMPLANT Right   . ESOPHAGOGASTRODUODENOSCOPY (EGD) WITH PROPOFOL Left 01/21/2016    Procedure: ESOPHAGOGASTRODUODENOSCOPY (EGD) WITH PROPOFOL;  Surgeon: Willis Modena, MD;  Location: [facility] ENDOSCOPY;  Service: Endoscopy;  Laterality: Left;  . FINGER SURGERY Right 1990s?   "thumb; sliced it w/a blade"  . IVC FILTER PLACEMENT ([facility] HX)    . US GUIDED PARACENTESIS ([facility] HX)     Prior to Admission medications   Medication Sig Start Date End Date Taking? Authorizing Provider  acetaminophen (TYLENOL) 500 MG tablet Take 1,500 mg by mouth every 6 (six) hours as needed for mild pain.   Yes Historical Provider, MD  cetirizine (ZYRTEC) 10 MG tablet Take 10 mg by mouth daily.   Yes Historical Provider, MD  cyanocobalamin 500 MCG tablet Take 500 mcg by mouth daily.   Yes Historical Provider, MD  ferrous sulfate 325 (65 FE) MG tablet Take 325 mg by mouth daily with breakfast.   Yes Historical Provider, MD  folic acid (FOLVITE) 1 MG tablet Take 1 mg by mouth daily.   Yes Historical Provider, MD  gabapentin (NEURONTIN) 300 MG capsule Take 900 mg by mouth at bedtime.   Yes Historical Provider, MD  insulin regular human CONCENTRATED (HUMULIN R) 500 UNIT/ML injection Inject 50-100 Units into the skin See admin instructions. 100 units in am, 50 units in afternoon, 100 units in pm   Yes Historical Provider, MD  nadolol (CORGARD) 20 MG tablet Take 20 mg by mouth daily.  Yes Historical Provider, MD  ondansetron (ZOFRAN) 8 MG tablet Take 8 mg by mouth every 8 (eight) hours as needed for nausea or vomiting.   Yes Historical Provider, MD  pantoprazole (PROTONIX) 40 MG tablet Take 40 mg by mouth 2 (two) times daily.   Yes Historical Provider, MD  simvastatin (ZOCOR) 20 MG tablet Take 20 mg by mouth at bedtime.   Yes Historical Provider, MD  zolpidem (AMBIEN) 5 MG tablet Take 5 mg by mouth at bedtime as needed for sleep.   Yes Historical Provider, MD   Allergies  Allergen Reactions  . Duloxetine Hcl Other (See Comments)    FAMILY HISTORY:  History reviewed. No pertinent family history. SOCIAL  HISTORY:  reports that he has been smoking Cigarettes.  He has a 1.25 pack-year smoking history. He has never used smokeless tobacco. He reports that he does not drink alcohol or use drugs.  REVIEW OF SYSTEMS:  Patient states he is feeling a little improvement today in his breathing. He would like to drink or eat today. He reports a couple of melanic stools last evening; reports he has had them intermittently in the past but not in a long time; he denies abdominal pain, nausea, vomiting.   VITAL SIGNS: Temp:  [97 F (36.1 C)-98.6 F (37 C)] 97.4 F (36.3 C) (04/20 0333) Pulse Rate:  [48-85] 56 (04/20 0630) Resp:  [5-34] 30 (04/20 0630) BP: (87-126)/(53-87) 103/68 (04/20 0630) SpO2:  [83 %-98 %] 93 % (04/20 0630) FiO2 (%):  [60 %-75 %] 75 % (04/20 0111) Weight:  [82.9 kg (182 lb 12.2 oz)] 82.9 kg (182 lb 12.2 oz) (04/20 0500) HEMODYNAMICS:   VENTILATOR SETTINGS: FiO2 (%):  [60 %-75 %] 75 % INTAKE / OUTPUT: Intake/Output      04/19 0701 - 04/20 0700 04/20 0701 - 04/21 0700   P.O.     I.V. (mL/kg) 1210.8 (14.6)    IV Piggyback 600    Total Intake(mL/kg) 1810.8 (21.8)    Urine (mL/kg/hr) 575 (0.3)    Stool 1 (0)    Total Output 576     Net +1234.8          Urine Occurrence 1 x    Stool Occurrence 2 x      PHYSICAL EXAMINATION: General:  NAD Neuro:  CN 2-12 intact, moving all extremities, generalized weakness HEENT:  BiPAP in place Cardiovascular:  RRR, no murmurs, rubs or gallops appreciated Lungs:  Decreased breath sounds throughout, no appreciable rales or rhonchi, no increased work of breathing; speaking in full sentences even with BiPAP Abdomen:  Distended, nontender, BS Musculoskeletal:  Generalized weakness Skin:  intact  LABS: Cbc  Recent Labs Lab 08/07/16 0411 08/08/16 0709 08/09/16 0223  WBC 5.0 4.2 1.9*  HGB 15.6 15.4 13.8  HCT 45.0 43.4 39.9  PLT 63* 43* 30*    Chemistry   Recent Labs Lab 08/07/16 0411 08/08/16 0242 08/09/16 0223  NA 132*  137 139  K 4.3 4.0 3.8  CL 96* 105 103  CO2 26 21* 24  BUN 19 21* 23*  CREATININE 0.90 0.65 0.67  CALCIUM 7.9* 7.7* 8.1*  MG  --   --  2.1  PHOS  --   --  1.9*  GLUCOSE 257* 151* 236*    Liver fxn  Recent Labs Lab 08/06/16 0830 08/07/16 0411 08/08/16 0242  AST 62* 72* 61*  ALT 35 29 24  ALKPHOS 82 67 53  BILITOT 1.7* 1.6* 1.6*  PROT 6.7 5.8* 5.1*  ALBUMIN 3.5 2.7* 2.3*   coags No results for input(s): APTT, INR in the last 168 hours. Sepsis markers  Recent Labs Lab 08/06/16 0841 08/06/16 1125 08/07/16 0411 08/08/16 1040 08/09/16 0223  LATICACIDVEN 3.88* 2.50* 2.3*  --   --   PROCALCITON  --   --   --  0.95 0.64   Cardiac markers  Recent Labs Lab 08/08/16 1040  TROPONINI <0.03   BNP No results for input(s): PROBNP in the last 168 hours. ABG  Recent Labs Lab 08/07/16 0920  PHART 7.451*  PCO2ART 32.8  PO2ART 63.7*  HCO3 22.5    CBG trend  Recent Labs Lab 08/08/16 1128 08/08/16 1537 08/08/16 1929 08/08/16 2312 08/09/16 0334  GLUCAP 183* 213* 206* 200* 232*    DIAGNOSES: Principal Problem:   Influenza A Active Problems:   Diabetes (HCC)   Esophageal varices in cirrhosis (HCC)   Tobacco abuse   GERD (gastroesophageal reflux disease)   Liver cirrhosis secondary to NASH (HCC)   Thrombocytopenia (HCC)   Pressure injury of skin   ASSESSMENT / PLAN:  PULMONARY A: Acute hypoxic respiratory failure 2nd to Influenza A pneumonia with possible bacterial superinfection Tobacco abuse. ALI P:   oxygen to keep SpO2 > 92%, continue Bipap - trial off this morning Consider de-escalating abx Scheduled BDs Lasix kvo steroids  CARDIOVASCULAR A:  Sepsis with lactic acidosis. Chronic systolic CHF. Cirrhosis with varices P: Cont nadolol, appears euvolemic, no lasix.  Cont IVF  RENAL A:   Stable, no issues P:   Consider lower volume in am  bmet in am  kvo lasix  GASTROINTESTINAL A:   NASH with cirrhosis, with esophageal  varices with bleeding in the past Melanic stools last night; Hgb stable HLD P:   Cont nadolol, PPI  HEMATOLOGIC A:   DVT with IVC filter Thrombocytopenia likely from cirrhosis  P:  SCD, monitor CBC  INFECTIOUS A:   Influenza A with possible bacterial super infection P:   Cont tamiflu Narrow vanc and zosyn to ceftx? In am  Pct assessment  ENDOCRINE A:   DM II with polyneuropathy P:   SSI, neurontin  NEUROLOGIC A:   Stable, At risk for hepatic encephalopathy P:   RASS goal: 1  I have personally obtained a history, examined the patient, evaluated laboratory and imaging results, formulated the assessment and plan and placed orders. CRITICAL CARE: The patient is critically ill with multiple organ systems failure and requires high complexity decision making for assessment and support, frequent evaluation and titration of therapies, application of advanced monitoring technologies and extensive interpretation of multiple databases. Critical Care Time devoted to patient care services described in this note is 33 minutes.   Nyra Market, MD PCCM - PGY1 Pulmonary and Critical Care Medicine [facility] Pager: [phone]  08/09/2016, 7:13 AM  STAFF NOTE: I, Rory Percy, MD FACP have personally reviewed patient's available data, including medical history, events of note, physical examination and test results as part of my evaluation. I have discussed with resident/NP and other care providers such as pharmacist, RN and RRT. In addition, I personally evaluated patient and elicited key findings of:no distress on NIMV, int desat, coarse BS, pos balance, in setting of liver dz and ALI / ARDS, consider lasix, maintain NIMV with mandatory interruption, failed [place] hf, keep steroids, add lasix tid, concerning will need ETT, pcxr worsening, hope lasix can improve this, his neurostatus is super!!, npo unfortunately still needed, plat from liver / sepsis/ dilution - lasix,  continued  tamiflu, supp phos to avoid with resp failure, k-phos, mbmet in am , pcxr ina m , no abg needed The patient is critically ill with multiple organ systems failure and requires high complexity decision making for assessment and support, frequent evaluation and titration of therapies, application of advanced monitoring technologies and extensive interpretation of multiple databases.   Critical Care Time devoted to patient care services described in this note is 30 Minutes. This time reflects time of care of this signee: Rory Percy, MD FACP. This critical care time does not reflect procedure time, or teaching time or supervisory time of PA/NP/Med student/Med Resident etc but could involve care discussion time. Rest per NP/medical resident whose note is outlined above and that I agree with   Mcarthur Rossetti. Tyson Alias, MD, FACP Pgr: [phone] [place] [facility] 08/09/2016 11:21 AM

## 2016-08-09 NOTE — Care Management Note (Signed)
Case Management Note  Patient Details  Name: Wayne Benson MRN: [id number] Date of Birth: 03-Jan-1956  Subjective/Objective:   Pt admitted with RF 2nd to Flu and PNA and possible bacterial superinfection  Action/Plan:   PTA in independent from home with wife.  Pt is not stable per attending for transfer to [facility] hospital.  Pt is active with Beverly Milch - PCP and prescription medications supplies via [place].  Pt is on BIPAP - information obtained from spouse via phone.  CM left voicemail for April with the [facility] and also informed [place] clinic of admit.  CM will continue to follow for discharge needs   Expected Discharge Date:                  Expected Discharge Plan:  Home/Self Care  In-House Referral:     Discharge planning Services  CM Consult  Post Acute Care Choice:    Choice offered to:     DME Arranged:    DME Agency:     HH Arranged:    HH Agency:     Status of Service:     If discussed at [employer] of Stay Meetings, dates discussed:    Additional Comments:  Cherylann Parr, RN 08/09/2016, 1:32 PM

## 2016-08-09 NOTE — Progress Notes (Signed)
Pt had black tarry stools x2. Notified resident Molt. Fecal occult ordered. Pt fecal occult positive. Pt bp slightly drop, currently 95/56. However pt alert and oriented x4.  Notified resident Molt. No new orders. Will continue to monitor.

## 2016-08-10 ENCOUNTER — Inpatient Hospital Stay (HOSPITAL_COMMUNITY): Payer: No Typology Code available for payment source

## 2016-08-10 LAB — GLUCOSE, CAPILLARY
GLUCOSE-CAPILLARY: 376 mg/dL — AB (ref 65–99)
GLUCOSE-CAPILLARY: 257 mg/dL — AB (ref 65–99)
Glucose-Capillary: 240 mg/dL — ABNORMAL HIGH (ref 65–99)
Glucose-Capillary: 240 mg/dL — ABNORMAL HIGH (ref 65–99)
Glucose-Capillary: 292 mg/dL — ABNORMAL HIGH (ref 65–99)
Glucose-Capillary: 303 mg/dL — ABNORMAL HIGH (ref 65–99)

## 2016-08-10 LAB — CBC
HCT: 40.6 % (ref 39.0–52.0)
HEMOGLOBIN: 14 g/dL (ref 13.0–17.0)
MCH: 30.8 pg (ref 26.0–34.0)
MCHC: 34.5 g/dL (ref 30.0–36.0)
MCV: 89.2 fL (ref 78.0–100.0)
Platelets: 41 10*3/uL — ABNORMAL LOW (ref 150–400)
RBC: 4.55 MIL/uL (ref 4.22–5.81)
RDW: 13.8 % (ref 11.5–15.5)
WBC: 4.3 10*3/uL (ref 4.0–10.5)

## 2016-08-10 LAB — MAGNESIUM: MAGNESIUM: 2.1 mg/dL (ref 1.7–2.4)

## 2016-08-10 LAB — BASIC METABOLIC PANEL
ANION GAP: 10 (ref 5–15)
BUN: 23 mg/dL — AB (ref 6–20)
CHLORIDE: 101 mmol/L (ref 101–111)
CO2: 26 mmol/L (ref 22–32)
Calcium: 7.9 mg/dL — ABNORMAL LOW (ref 8.9–10.3)
Creatinine, Ser: 0.64 mg/dL (ref 0.61–1.24)
GFR calc Af Amer: >60 mL/min (ref >60)
GFR calc non Af Amer: >60 mL/min (ref >60)
GLUCOSE: 333 mg/dL — AB (ref 65–99)
POTASSIUM: 3.4 mmol/L — AB (ref 3.5–5.1)
Sodium: 137 mmol/L (ref 135–145)

## 2016-08-10 LAB — PHOSPHORUS: Phosphorus: 1.9 mg/dL — ABNORMAL LOW (ref 2.5–4.6)

## 2016-08-10 LAB — PROCALCITONIN: Procalcitonin: 0.27 ng/mL

## 2016-08-10 MED ORDER — METHYLPREDNISOLONE SODIUM SUCC 125 MG IJ SOLR
60.0000 mg | Freq: Two times a day (BID) | INTRAMUSCULAR | Status: DC
  Administered 2016-08-10 – 2016-08-11 (×2): 60 mg via INTRAVENOUS
  Filled 2016-08-10 (×2): qty 0.96

## 2016-08-10 MED ORDER — POTASSIUM CHLORIDE CRYS ER 20 MEQ PO TBCR
40.0000 meq | EXTENDED_RELEASE_TABLET | Freq: Once | ORAL | Status: AC
  Administered 2016-08-10: 40 meq via ORAL
  Filled 2016-08-10: qty 2

## 2016-08-10 MED ORDER — ACETAMINOPHEN 325 MG PO TABS
650.0000 mg | ORAL_TABLET | Freq: Four times a day (QID) | ORAL | Status: DC | PRN
  Administered 2016-08-10 – 2016-08-12 (×3): 650 mg via ORAL
  Filled 2016-08-10 (×3): qty 2

## 2016-08-10 MED ORDER — INSULIN GLARGINE 100 UNIT/ML ~~LOC~~ SOLN
20.0000 [IU] | Freq: Every day | SUBCUTANEOUS | Status: DC
  Administered 2016-08-10 – 2016-08-11 (×2): 20 [IU] via SUBCUTANEOUS
  Filled 2016-08-10 (×2): qty 0.2

## 2016-08-10 MED ORDER — POTASSIUM PHOSPHATES 15 MMOLE/5ML IV SOLN
20.0000 mmol | Freq: Once | INTRAVENOUS | Status: AC
  Administered 2016-08-10: 20 mmol via INTRAVENOUS
  Filled 2016-08-10 (×2): qty 6.67

## 2016-08-10 NOTE — Progress Notes (Signed)
PULMONARY  / CRITICAL CARE MEDICINE  Name: Wayne Benson MRN: [id number] DOB: 04-12-1956    LOS: 4  REFERRING MD :  Earl Lagos, MD - IMTS   BRIEF PATIENT DESCRIPTION: Asked by Dr. Ladona Ridgel with IMTS to assess for dyspnea. 61 yo male smoker presented with 3 days of cough, fever 101F and fatigue.  Found to have influenza A.  Had progressive hypoxia and transitioned to Bipap.  CXR shows progressive infiltrates.  He had elevated lactic acid. He denies sinus congestion, sore throat, headache, abdominal pain, nausea, diarrhea, skin rash.  He feels his chest is tight but seems better since being started on Bipap  LINES / TUBES: PIV bil 4/17>>  CULTURES: Blood 4/17 >> Influenza PCR 4/17 >> Influenza A positive Pneumococcal Ag 4/18 >> negative Legionella Ag 4/18 >> C diff 4/19>> negative  ANTIBIOTICS: Rocephin 4/17 >> 4/17 Zithromax 4/17 >> 4/17 Tamiflu 4/17 >> Vancomycin 4/17 >> Zosyn 4/17 >>  SIGNIFICANT EVENTS:  4/17 Admit 4/18 To ICU for resp failure failing bipap. Echo 01/20/16 >> EF 40 to 45% CXR 4/18 and 4/19 b/l infiltrates.  Overnight: did okay on NIMV, glu 250, neg 1 liter  VITAL SIGNS: Temp:  [96.4 F (35.8 C)-98 F (36.7 C)] 97.8 F (36.6 C) (04/21 0759) Pulse Rate:  [54-67] 54 (04/21 0700) Resp:  [10-38] 17 (04/21 0700) BP: (87-126)/(51-88) 91/58 (04/21 0700) SpO2:  [79 %-97 %] 93 % (04/21 0805) FiO2 (%):  [80 %-100 %] 90 % (04/21 0805) Weight:  [82.7 kg (182 lb 5.1 oz)] 82.7 kg (182 lb 5.1 oz) (04/21 0500) HEMODYNAMICS:   VENTILATOR SETTINGS: FiO2 (%):  [80 %-100 %] 90 % INTAKE / OUTPUT: Intake/Output      04/20 0701 - 04/21 0700 04/21 0701 - 04/22 0700   P.O. 100    I.V. (mL/kg) 1050 (12.7)    IV Piggyback 656.7    Total Intake(mL/kg) 1806.7 (21.8)    Urine (mL/kg/hr) 2850 (1.4)    Stool     Total Output 2850     Net -1043.3            PHYSICAL EXAMINATION: General:  No distress Neuro:  CN 2-12 intact, moving all extremities FC HEENT:   BiPAP off and on , currently off Cardiovascular:   s1 s2 RRR Lungs:  Reduced clear, jvd Abdomen:  Distended, nontender, BS Musculoskeletal:  Generalized weakness Skin:  intact  LABS: Cbc  Recent Labs Lab 08/08/16 0709 08/09/16 0223 08/10/16 0244  WBC 4.2 1.9* 4.3  HGB 15.4 13.8 14.0  HCT 43.4 39.9 40.6  PLT 43* 30* 41*    Chemistry   Recent Labs Lab 08/08/16 0242 08/09/16 0223 08/10/16 0244  NA 137 139 137  K 4.0 3.8 3.4*  CL 105 103 101  CO2 21* 24 26  BUN 21* 23* 23*  CREATININE 0.65 0.67 0.64  CALCIUM 7.7* 8.1* 7.9*  MG  --  2.1 2.1  PHOS  --  1.9* 1.9*  GLUCOSE 151* 236* 333*    Liver fxn  Recent Labs Lab 08/06/16 0830 08/07/16 0411 08/08/16 0242  AST 62* 72* 61*  ALT 35 29 24  ALKPHOS 82 67 53  BILITOT 1.7* 1.6* 1.6*  PROT 6.7 5.8* 5.1*  ALBUMIN 3.5 2.7* 2.3*   coags No results for input(s): APTT, INR in the last 168 hours. Sepsis markers  Recent Labs Lab 08/06/16 0841 08/06/16 1125 08/07/16 0411 08/08/16 1040 08/09/16 0223 08/10/16 0244  LATICACIDVEN 3.88* 2.50* 2.3*  --   --   --  PROCALCITON  --   --   --  0.95 0.64 0.27   Cardiac markers  Recent Labs Lab 08/08/16 1040  TROPONINI <0.03   BNP No results for input(s): PROBNP in the last 168 hours. ABG  Recent Labs Lab 08/07/16 0920  PHART 7.451*  PCO2ART 32.8  PO2ART 63.7*  HCO3 22.5    CBG trend  Recent Labs Lab 08/09/16 1547 08/09/16 1939 08/09/16 2336 08/10/16 0349 08/10/16 0808  GLUCAP 249* 364* 243* 292* 240*    DIAGNOSES: Principal Problem:   Influenza A Active Problems:   Diabetes (HCC)   Esophageal varices in cirrhosis (HCC)   Tobacco abuse   GERD (gastroesophageal reflux disease)   Liver cirrhosis secondary to NASH (HCC)   Thrombocytopenia (HCC)   Pressure injury of skin   ASSESSMENT / PLAN:  PULMONARY A: Acute hypoxic respiratory failure 2nd to Influenza A pneumonia with possible bacterial superinfection Tobacco abuse. ALI P:    NIMV 4 hours on , would like to try and extend off 1 hour with high flow [place] during that time, would accept sats 88% during this time period Scheduled BDs Lasix keep to neg  balance kvo Steroids keep to q12h pcxr   CARDIOVASCULAR A:  Sepsis with lactic acidosis. Chronic systolic CHF. Cirrhosis with varices P: Cont nadolol Lasix to neg as able, tolerated    RENAL A:  tolerated lasix, fluid in fissure, hypok  P:   k supp, phos low , supp bmet in am  kvo Lasix keep same dose  GASTROINTESTINAL A:   NASH with cirrhosis, with esophageal varices with bleeding in the past Melanic stools last night; Hgb stable HLD P:   Cont nadolol, PPI No ett needed days, advance to fulls Get lft in am   HEMATOLOGIC A:   DVT with IVC filter Thrombocytopenia likely from cirrhosis and dilution better with lasix P:  SCD, monitor CBC lasix  INFECTIOUS A:   Influenza A with possible bacterial super infection P:   Cont tamiflu, consider 5-7 days CTX to stop date  Pct down and low  ENDOCRINE A:   DM II with polyneuropathy P:   SSI, neurontin Add lantus increase to 20  NEUROLOGIC A:   Stable, At risk for hepatic encephalopathy P:   RASS goal: 1  addinf diet, may need lactulose Ccm time 30 min    Mcarthur Rossetti. Tyson Alias, MD, FACP Pgr: [phone] [facility] 08/10/2016 8:23 AM

## 2016-08-10 NOTE — Progress Notes (Signed)
Pt taken off bipap and placed on HFNC at 45L and 90% FIO2.  Pt tolerating well, sats 93%.  No SOB noted.  RT will continue to monitor.

## 2016-08-11 ENCOUNTER — Inpatient Hospital Stay (HOSPITAL_COMMUNITY): Payer: No Typology Code available for payment source

## 2016-08-11 LAB — CBC WITH DIFFERENTIAL/PLATELET
BASOS PCT: 0 %
Basophils Absolute: 0 10*3/uL (ref 0.0–0.1)
EOS ABS: 0 10*3/uL (ref 0.0–0.7)
EOS PCT: 0 %
HCT: 38.1 % — ABNORMAL LOW (ref 39.0–52.0)
Hemoglobin: 13.5 g/dL (ref 13.0–17.0)
LYMPHS ABS: 0.9 10*3/uL (ref 0.7–4.0)
Lymphocytes Relative: 20 %
MCH: 31.4 pg (ref 26.0–34.0)
MCHC: 35.4 g/dL (ref 30.0–36.0)
MCV: 88.6 fL (ref 78.0–100.0)
Monocytes Absolute: 0.2 10*3/uL (ref 0.1–1.0)
Monocytes Relative: 5 %
NEUTROS PCT: 75 %
Neutro Abs: 3.4 10*3/uL (ref 1.7–7.7)
PLATELETS: 44 10*3/uL — AB (ref 150–400)
RBC: 4.3 MIL/uL (ref 4.22–5.81)
RDW: 13.7 % (ref 11.5–15.5)
WBC: 4.5 10*3/uL (ref 4.0–10.5)

## 2016-08-11 LAB — CULTURE, BLOOD (ROUTINE X 2)
CULTURE: NO GROWTH
CULTURE: NO GROWTH
Special Requests: ADEQUATE
Special Requests: ADEQUATE

## 2016-08-11 LAB — GLUCOSE, CAPILLARY
GLUCOSE-CAPILLARY: 159 mg/dL — AB (ref 65–99)
GLUCOSE-CAPILLARY: 190 mg/dL — AB (ref 65–99)
GLUCOSE-CAPILLARY: 216 mg/dL — AB (ref 65–99)
GLUCOSE-CAPILLARY: 242 mg/dL — AB (ref 65–99)
GLUCOSE-CAPILLARY: 362 mg/dL — AB (ref 65–99)
Glucose-Capillary: 219 mg/dL — ABNORMAL HIGH (ref 65–99)

## 2016-08-11 LAB — COMPREHENSIVE METABOLIC PANEL
ALK PHOS: 54 U/L (ref 38–126)
ALT: 25 U/L (ref 17–63)
AST: 50 U/L — AB (ref 15–41)
Albumin: 2.4 g/dL — ABNORMAL LOW (ref 3.5–5.0)
Anion gap: 9 (ref 5–15)
BUN: 19 mg/dL (ref 6–20)
CALCIUM: 8.3 mg/dL — AB (ref 8.9–10.3)
CHLORIDE: 102 mmol/L (ref 101–111)
CO2: 28 mmol/L (ref 22–32)
CREATININE: 0.6 mg/dL — AB (ref 0.61–1.24)
GFR calc Af Amer: >60 mL/min (ref >60)
Glucose, Bld: 144 mg/dL — ABNORMAL HIGH (ref 65–99)
Potassium: 2.9 mmol/L — ABNORMAL LOW (ref 3.5–5.1)
SODIUM: 139 mmol/L (ref 135–145)
Total Bilirubin: 1.2 mg/dL (ref 0.3–1.2)
Total Protein: 5.2 g/dL — ABNORMAL LOW (ref 6.5–8.1)

## 2016-08-11 LAB — MAGNESIUM: Magnesium: 1.9 mg/dL (ref 1.7–2.4)

## 2016-08-11 LAB — PHOSPHORUS: PHOSPHORUS: 2.1 mg/dL — AB (ref 2.5–4.6)

## 2016-08-11 MED ORDER — POTASSIUM CHLORIDE CRYS ER 20 MEQ PO TBCR
40.0000 meq | EXTENDED_RELEASE_TABLET | ORAL | Status: AC
  Administered 2016-08-11 (×2): 40 meq via ORAL
  Filled 2016-08-11 (×2): qty 2

## 2016-08-11 MED ORDER — SODIUM PHOSPHATES 45 MMOLE/15ML IV SOLN
10.0000 mmol | Freq: Once | INTRAVENOUS | Status: AC
  Administered 2016-08-11: 10 mmol via INTRAVENOUS
  Filled 2016-08-11: qty 3.33

## 2016-08-11 MED ORDER — INSULIN GLARGINE 100 UNIT/ML ~~LOC~~ SOLN
10.0000 [IU] | Freq: Once | SUBCUTANEOUS | Status: AC
  Administered 2016-08-11: 10 [IU] via SUBCUTANEOUS
  Filled 2016-08-11: qty 0.1

## 2016-08-11 MED ORDER — INSULIN GLARGINE 100 UNIT/ML ~~LOC~~ SOLN
30.0000 [IU] | Freq: Every day | SUBCUTANEOUS | Status: DC
  Administered 2016-08-12 – 2016-08-15 (×4): 30 [IU] via SUBCUTANEOUS
  Filled 2016-08-11 (×4): qty 0.3

## 2016-08-11 MED ORDER — MAGNESIUM SULFATE 2 GM/50ML IV SOLN
2.0000 g | Freq: Once | INTRAVENOUS | Status: AC
  Administered 2016-08-11: 2 g via INTRAVENOUS
  Filled 2016-08-11: qty 50

## 2016-08-11 MED ORDER — METHYLPREDNISOLONE SODIUM SUCC 40 MG IJ SOLR
40.0000 mg | Freq: Two times a day (BID) | INTRAMUSCULAR | Status: DC
  Administered 2016-08-11 – 2016-08-13 (×4): 40 mg via INTRAVENOUS
  Filled 2016-08-11 (×4): qty 1

## 2016-08-11 MED ORDER — IPRATROPIUM-ALBUTEROL 0.5-2.5 (3) MG/3ML IN SOLN
3.0000 mL | Freq: Three times a day (TID) | RESPIRATORY_TRACT | Status: DC
  Administered 2016-08-12 – 2016-08-18 (×19): 3 mL via RESPIRATORY_TRACT
  Filled 2016-08-11 (×19): qty 3

## 2016-08-11 MED ORDER — SODIUM CHLORIDE 0.9 % IV SOLN
30.0000 meq | Freq: Once | INTRAVENOUS | Status: AC
  Administered 2016-08-11: 30 meq via INTRAVENOUS
  Filled 2016-08-11: qty 15

## 2016-08-11 NOTE — Progress Notes (Signed)
[facility] ADULT ICU REPLACEMENT PROTOCOL FOR AM LAB REPLACEMENT ONLY  The patient does apply for the [facility] Adult ICU Electrolyte Replacment Protocol based on the criteria listed below:   1. Is GFR >/= 40 ml/min? Yes.    Patient's GFR today is >60 2. Is urine output >/= 0.5 ml/kg/hr for the last 6 hours? Yes.   Patient's UOP is 1.0 ml/kg/hr 3. Is BUN < 60 mg/dL? Yes.    Patient's BUN today is 19 4. Abnormal electrolyte(s): K+ 2.9 Phos 2.1 5. Ordered repletion with: Protocol 6. If a panic level lab has been reported, has the CCM MD in charge been notified? No..   Physician:  Anselm Pancoast, Renae Fickle [place] 08/11/2016 3:14 AM

## 2016-08-11 NOTE — Progress Notes (Addendum)
PULMONARY  / CRITICAL CARE MEDICINE  Name: Wayne Benson MRN: [id number] DOB: 01/06/1956    LOS: 5  REFERRING MD :  Earl Lagos, MD - IMTS   BRIEF PATIENT DESCRIPTION: Asked by Dr. Ladona Ridgel with IMTS to assess for dyspnea. 61 yo male smoker presented with 3 days of cough, fever 101F and fatigue.  Found to have influenza A.  Had progressive hypoxia and transitioned to Bipap.  CXR shows progressive infiltrates.  He had elevated lactic acid. He denies sinus congestion, sore throat, headache, abdominal pain, nausea, diarrhea, skin rash.  He feels his chest is tight but seems better since being started on Bipap  LINES / TUBES: PIV bil 4/17>>  CULTURES: Blood 4/17 >> Influenza PCR 4/17 >> Influenza A positive Pneumococcal Ag 4/18 >> negative Legionella Ag 4/18 >> negative C diff 4/19>> negative  ANTIBIOTICS: Rocephin 4/17 >> 4/17 Zithromax 4/17 >> 4/17 Tamiflu 4/17 >> Vancomycin 4/17 >>4/10 Zosyn 4/17 >>4/20 Ceftriaxone 4/20>>  SIGNIFICANT EVENTS:  4/17 Admit 4/18 To ICU for resp failure failing bipap. Echo 01/20/16 >> EF 40 to 45% CXR 4/18 and 4/19 b/l infiltrates.  Overnight: No further melena; feels his breathing is much improved from admission.  VITAL SIGNS: Temp:  [97.3 F (36.3 C)-98.4 F (36.9 C)] 97.3 F (36.3 C) (04/22 0359) Pulse Rate:  [54-67] 57 (04/22 0600) Resp:  [13-33] 23 (04/22 0600) BP: (80-113)/(49-80) 102/61 (04/22 0600) SpO2:  [79 %-100 %] 98 % (04/22 0600) FiO2 (%):  [80 %-100 %] 100 % (04/21 2012) Weight:  [81.8 kg (180 lb 5.4 oz)] 81.8 kg (180 lb 5.4 oz) (04/22 0339) HEMODYNAMICS:   VENTILATOR SETTINGS: FiO2 (%):  [80 %-100 %] 100 % INTAKE / OUTPUT: Intake/Output      04/21 0701 - 04/22 0700   P.O. 750   I.V. (mL/kg) 1250 (15.3)   IV Piggyback 810   Total Intake(mL/kg) 2810 (34.4)   Urine (mL/kg/hr) 2525 (1.3)   Stool 3 (0)   Total Output 2528   Net +282       Urine Occurrence 3 x   Stool Occurrence 7 x     PHYSICAL  EXAMINATION: General:  No distress Neuro:  CN 2-12 intact, moving all extremities  HEENT:  BiPAP off and on , currently on Cardiovascular: RRR, N S1/S2, pulses intact, no LE edema Lungs:  Reduced, CTAB Abdomen:  Distended, nontender, +BS Musculoskeletal:  Generalized weakness Skin:  intact  LABS: Cbc  Recent Labs Lab 08/09/16 0223 08/10/16 0244 08/11/16 0203  WBC 1.9* 4.3 4.5  HGB 13.8 14.0 13.5  HCT 39.9 40.6 38.1*  PLT 30* 41* 44*    Chemistry   Recent Labs Lab 08/09/16 0223 08/10/16 0244 08/11/16 0203  NA 139 137 139  K 3.8 3.4* 2.9*  CL 103 101 102  CO2 BUN 23* 23* 19  CREATININE 0.67 0.64 0.60*  CALCIUM 8.1* 7.9* 8.3*  MG 2.1 2.1 1.9  PHOS 1.9* 1.9* 2.1*  GLUCOSE 236* 333* 144*    Liver fxn  Recent Labs Lab 08/07/16 0411 08/08/16 0242 08/11/16 0203  AST 72* 61* 50*  ALT ALKPHOS 67 53 54  BILITOT 1.6* 1.6* 1.2  PROT 5.8* 5.1* 5.2*  ALBUMIN 2.7* 2.3* 2.4*   coags No results for input(s): APTT, INR in the last 168 hours. Sepsis markers  Recent Labs Lab 08/06/16 0841 08/06/16 1125 08/07/16 0411 08/08/16 1040 08/09/16 0223 08/10/16 0244  LATICACIDVEN 3.88* 2.50* 2.3*  --   --   --  PROCALCITON  --   --   --  0.95 0.64 0.27   Cardiac markers  Recent Labs Lab 08/08/16 1040  TROPONINI <0.03   BNP No results for input(s): PROBNP in the last 168 hours. ABG  Recent Labs Lab 08/07/16 0920  PHART 7.451*  PCO2ART 32.8  PO2ART 63.7*  HCO3 22.5    CBG trend  Recent Labs Lab 08/10/16 1214 08/10/16 1606 08/10/16 2001 08/10/16 2345 08/11/16 0358  GLUCAP 376* 257* 303* 240* 159*    DIAGNOSES: Principal Problem:   Influenza A Active Problems:   Diabetes (HCC)   Esophageal varices in cirrhosis (HCC)   Tobacco abuse   GERD (gastroesophageal reflux disease)   Liver cirrhosis secondary to NASH (HCC)   Thrombocytopenia (HCC)   Pressure injury of skin   ASSESSMENT / PLAN:  PULMONARY A: Acute  hypoxic respiratory failure 2nd to Influenza A pneumonia with possible bacterial superinfection Tobacco abuse. ALI P:   Continue trials off of BiPAP Scheduled BDs Lasix keep to neg balance kvo Steroids keep to q12h f/u AM CXR  CARDIOVASCULAR A:  Sepsis with lactic acidosis - resolved. Chronic systolic CHF. Cirrhosis with varices P: Cont nadolol Lasix to neg as able, tolerated   RENAL A:  Tolerating lasix; -0.5L net last 24hrs Hypokalemia Hypophosphatemia  P:   Supplement K and Phos Follow Bmets  Kvo Lasix keep same dose  GASTROINTESTINAL A:   NASH with cirrhosis, with esophageal varices with bleeding in the past Melanic stools this admission; Hgb stable HLD P:   Cont nadolol, PPI Advance diet  HEMATOLOGIC A:   DVT with IVC filter Thrombocytopenia likely from cirrhosis and dilution better with lasix P:  SCD, monitor CBC  INFECTIOUS A:   Influenza A with possible bacterial super infection P:   Finished 5 days of tamiflu- now off CTX to stop date   ENDOCRINE A:   DM II with polyneuropathy P:   SSI, neurontin Lantus increase to 30 units  NEUROLOGIC A:   Stable At risk for hepatic encephalopathy P:   RASS goal: 1    Nyra Market, MD PCCM - PGY1 [facility] 08/11/2016 6:43 AM  STAFF NOTE: Cindi Carbon, MD FACP have personally reviewed patient's available data, including medical history, events of note, physical examination and test results as part of my evaluation. I have discussed with resident/NP and other care providers such as pharmacist, RN and RRT. In addition, I personally evaluated patient and elicited key findings of: awake, alert, reports feeling better, sats now 93% on high fc [place], lungs coarse, I think he is turing the corner, despite pcxr about unchanged, repeat pcxr in am , likley benefit steroids have had, reduce slight to 40 , goal is to reduce NIMV use, to 2 hours on 6-8 hours off , if WOB is  good and maintain O2 with current delivery O2, can attempt to NOT use NIMv al togther, advance diet , has NOT required ETT for days, supp, k, phos, mag, lasix keep The patient is critically ill with multiple organ systems failure and requires high complexity decision making for assessment and support, frequent evaluation and titration of therapies, application of advanced monitoring technologies and extensive interpretation of multiple databases.   Critical Care Time devoted to patient care services described in this note is30 Minutes. This time reflects time of care of this signee: Rory Percy, MD FACP. This critical care time does not reflect procedure time, or teaching time or supervisory time of PA/NP/Med student/Med  Resident etc but could involve care discussion time. Rest per NP/medical resident whose note is outlined above and that I agree with   Mcarthur Rossetti. Tyson Alias, MD, FACP Pgr: [phone] [facility] 08/11/2016 10:04 AM

## 2016-08-12 ENCOUNTER — Inpatient Hospital Stay (HOSPITAL_COMMUNITY): Payer: No Typology Code available for payment source

## 2016-08-12 LAB — BASIC METABOLIC PANEL
ANION GAP: 6 (ref 5–15)
BUN: 19 mg/dL (ref 6–20)
CALCIUM: 8.4 mg/dL — AB (ref 8.9–10.3)
CO2: 28 mmol/L (ref 22–32)
Chloride: 105 mmol/L (ref 101–111)
Creatinine, Ser: 0.54 mg/dL — ABNORMAL LOW (ref 0.61–1.24)
GLUCOSE: 197 mg/dL — AB (ref 65–99)
POTASSIUM: 3.6 mmol/L (ref 3.5–5.1)
SODIUM: 139 mmol/L (ref 135–145)

## 2016-08-12 LAB — GLUCOSE, CAPILLARY
GLUCOSE-CAPILLARY: 163 mg/dL — AB (ref 65–99)
GLUCOSE-CAPILLARY: 400 mg/dL — AB (ref 65–99)
GLUCOSE-CAPILLARY: 147 mg/dL — AB (ref 65–99)
Glucose-Capillary: 122 mg/dL — ABNORMAL HIGH (ref 65–99)
Glucose-Capillary: 172 mg/dL — ABNORMAL HIGH (ref 65–99)
Glucose-Capillary: 282 mg/dL — ABNORMAL HIGH (ref 65–99)

## 2016-08-12 LAB — CBC
HCT: 38.5 % — ABNORMAL LOW (ref 39.0–52.0)
Hemoglobin: 13.5 g/dL (ref 13.0–17.0)
MCH: 31 pg (ref 26.0–34.0)
MCHC: 35.1 g/dL (ref 30.0–36.0)
MCV: 88.3 fL (ref 78.0–100.0)
PLATELETS: 42 10*3/uL — AB (ref 150–400)
RBC: 4.36 MIL/uL (ref 4.22–5.81)
RDW: 13.5 % (ref 11.5–15.5)
WBC: 4.7 10*3/uL (ref 4.0–10.5)

## 2016-08-12 LAB — PHOSPHORUS: Phosphorus: 2 mg/dL — ABNORMAL LOW (ref 2.5–4.6)

## 2016-08-12 LAB — MAGNESIUM: MAGNESIUM: 2.1 mg/dL (ref 1.7–2.4)

## 2016-08-12 MED ORDER — SODIUM PHOSPHATES 45 MMOLE/15ML IV SOLN
10.0000 mmol | Freq: Once | INTRAVENOUS | Status: AC
  Administered 2016-08-12: 10 mmol via INTRAVENOUS
  Filled 2016-08-12: qty 3.33

## 2016-08-12 NOTE — Progress Notes (Addendum)
PULMONARY  / CRITICAL CARE MEDICINE  Name: Wayne Benson MRN: [id number] DOB: January 28, 1956    LOS: 6  REFERRING MD :  Earl Lagos, MD - IMTS   BRIEF PATIENT DESCRIPTION: Asked by Dr. Ladona Ridgel with IMTS to assess for dyspnea. 61 yo male smoker presented with 3 days of cough, fever 101F and fatigue.  Found to have influenza A.  Had progressive hypoxia and transitioned to Bipap.  CXR shows progressive infiltrates.  He had elevated lactic acid. He denies sinus congestion, sore throat, headache, abdominal pain, nausea, diarrhea, skin rash.  He feels his chest is tight but seems better since being started on Bipap  LINES / TUBES: PIV bil 4/17>>  CULTURES: Blood 4/17 >>NGx5d final Influenza PCR 4/17 >> Influenza A positive Pneumococcal Ag 4/18 >> negative Legionella Ag 4/18 >> negative C diff 4/19>> negative  ANTIBIOTICS: Rocephin 4/17 >> 4/17 Zithromax 4/17 >> 4/17 Tamiflu 4/17 >>4/22 Vancomycin 4/17 >>4/10 Zosyn 4/17 >>4/20 Ceftriaxone 4/20>>  SIGNIFICANT EVENTS:  4/17 Admit 4/18 To ICU for resp failure failing bipap. Echo 01/20/16 >> EF 40 to 45% CXR 4/18 - 4/22 b/l infiltrates.  Overnight: No further melena; feels his breathing is much improved from admission. Was on HFNC most of day yesterday until ~1am.  VITAL SIGNS: Temp:  [97.4 F (36.3 C)-98.7 F (37.1 C)] 97.8 F (36.6 C) (04/23 0414) Pulse Rate:  [57-73] 59 (04/23 0400) Resp:  [13-36] 18 (04/23 0400) BP: (89-114)/(54-77) 103/73 (04/23 0400) SpO2:  [90 %-100 %] 95 % (04/23 0400) FiO2 (%):  [80 %-100 %] 80 % (04/22 2013) HEMODYNAMICS:   VENTILATOR SETTINGS: FiO2 (%):  [80 %-100 %] 80 % INTAKE / OUTPUT: Intake/Output      04/22 0701 - 04/23 0700 04/23 0701 - 04/24 0700   P.O. 700    I.V. (mL/kg)     IV Piggyback 315    Total Intake(mL/kg) 1015 (12.4)    Urine (mL/kg/hr) 2450 (1.2)    Stool 1 (0)    Total Output 2451     Net -1436          Urine Occurrence 1 x    Stool Occurrence 1 x      PHYSICAL  EXAMINATION: General:  No distress Neuro:  CN 2-12 intact, moving all extremities  HEENT:  BiPAP off and on , currently on Cardiovascular: RRR, N S1/S2, pulses intact, no LE edema Lungs:  Reduced, CTAB Abdomen:  Distended, nontender, +BS Musculoskeletal:  Generalized weakness Skin:  intact  LABS: Cbc  Recent Labs Lab 08/10/16 0244 08/11/16 0203 08/12/16 0214  WBC 4.3 4.5 4.7  HGB 14.0 13.5 13.5  HCT 40.6 38.1* 38.5*  PLT 41* 44* 42*    Chemistry   Recent Labs Lab 08/10/16 0244 08/11/16 0203 08/12/16 0214  NA 137 139 139  K 3.4* 2.9* 3.6  CL 101 102 105  CO2 BUN 23* 19 19  CREATININE 0.64 0.60* 0.54*  CALCIUM 7.9* 8.3* 8.4*  MG 2.1 1.9 2.1  PHOS 1.9* 2.1* 2.0*  GLUCOSE 333* 144* 197*    Liver fxn  Recent Labs Lab 08/07/16 0411 08/08/16 0242 08/11/16 0203  AST 72* 61* 50*  ALT ALKPHOS 67 53 54  BILITOT 1.6* 1.6* 1.2  PROT 5.8* 5.1* 5.2*  ALBUMIN 2.7* 2.3* 2.4*   coags No results for input(s): APTT, INR in the last 168 hours. Sepsis markers  Recent Labs Lab 08/06/16 0841 08/06/16 1125 08/07/16 0411 08/08/16 1040 08/09/16 0223 08/10/16  0244  LATICACIDVEN 3.88* 2.50* 2.3*  --   --   --   PROCALCITON  --   --   --  0.95 0.64 0.27   Cardiac markers  Recent Labs Lab 08/08/16 1040  TROPONINI <0.03   BNP No results for input(s): PROBNP in the last 168 hours. ABG  Recent Labs Lab 08/07/16 0920  PHART 7.451*  PCO2ART 32.8  PO2ART 63.7*  HCO3 22.5    CBG trend  Recent Labs Lab 08/11/16 1148 08/11/16 1709 08/11/16 1934 08/11/16 2346 08/12/16 0413  GLUCAP 362* 242* 190* 216* 163*    DIAGNOSES: Principal Problem:   Influenza A Active Problems:   Diabetes (HCC)   Esophageal varices in cirrhosis (HCC)   Tobacco abuse   GERD (gastroesophageal reflux disease)   Liver cirrhosis secondary to NASH (HCC)   Thrombocytopenia (HCC)   Pressure injury of skin   ASSESSMENT / PLAN:  PULMONARY A: Acute  hypoxic respiratory failure 2nd to Influenza A pneumonia with possible bacterial superinfection Tobacco abuse. ALI P:   Continue trials off of BiPAP Scheduled BDs Lasix keep to neg balance kvo Steroids keep to q12h f/u AM CXR  CARDIOVASCULAR A:  Sepsis with lactic acidosis - resolved. Chronic systolic CHF Cirrhosis with varices P: Cont nadolol Lasix to neg as able, tolerated   RENAL A:  Tolerating lasix Hypophosphatemia  P:   Supplement Phos Follow Bmets  Kvo Lasix keep same dose  GASTROINTESTINAL A:   NASH with cirrhosis, with esophageal varices with bleeding in the past Melanic stools early this admission; Hgb stable HLD P:   Cont nadolol, PPI  HEMATOLOGIC A:   DVT with IVC filter Thrombocytopenia likely from cirrhosis and dilution better with lasix P:  SCD, monitor CBC  INFECTIOUS A:   Influenza A with superimposed bacterial pneumonia P:   Finished 5 days of tamiflu- now off CTX to stop date   ENDOCRINE A:   DM II with polyneuropathy P:   SSI, neurontin Lantus 30 units daily  NEUROLOGIC A:   Stable At risk for hepatic encephalopathy P:   RASS goal: 1   Possible transfer to SDU today.  Nyra Market, MD PCCM - PGY1 [place] [facility] 08/12/2016 7:10 AM

## 2016-08-12 NOTE — Care Management Note (Signed)
Case Management Note  Patient Details  Name: WYLDER MACOMBER MRN: [id number] Date of Birth: 14-Jun-1955  Subjective/Objective:   Pt admitted with RF 2nd to Flu and PNA and possible bacterial superinfection  Action/Plan:   PTA in independent from home with wife.  Pt is not stable per attending for transfer to [facility] hospital.  Pt is active with Beverly Milch - PCP and prescription medications supplies via [place].  Pt is on BIPAP - information obtained from spouse via phone.  CM left voicemail for April with the [facility] and also informed [place] clinic of admit.  CM will continue to follow for discharge needs   Expected Discharge Date:                  Expected Discharge Plan:  Home/Self Care  In-House Referral:     Discharge planning Services  CM Consult  Post Acute Care Choice:    Choice offered to:     DME Arranged:    DME Agency:     HH Arranged:    HH Agency:     Status of Service:     If discussed at [employer] of Stay Meetings, dates discussed:    Additional Comments: 08/12/2016 Pt is currently on HFNC, IV antibitoics, IV steriods.  Plan is to transfer out of [place].  CM contacted transfer liason to inform of status and documentation as requested - left VM Cherylann Parr, RN 08/12/2016, 4:09 PM

## 2016-08-13 DIAGNOSIS — R188 Other ascites: Secondary | ICD-10-CM

## 2016-08-13 DIAGNOSIS — J9690 Respiratory failure, unspecified, unspecified whether with hypoxia or hypercapnia: Secondary | ICD-10-CM

## 2016-08-13 LAB — GLUCOSE, CAPILLARY
GLUCOSE-CAPILLARY: 280 mg/dL — AB (ref 65–99)
GLUCOSE-CAPILLARY: 233 mg/dL — AB (ref 65–99)
Glucose-Capillary: 129 mg/dL — ABNORMAL HIGH (ref 65–99)
Glucose-Capillary: 145 mg/dL — ABNORMAL HIGH (ref 65–99)
Glucose-Capillary: 166 mg/dL — ABNORMAL HIGH (ref 65–99)
Glucose-Capillary: 309 mg/dL — ABNORMAL HIGH (ref 65–99)

## 2016-08-13 LAB — BASIC METABOLIC PANEL
ANION GAP: 9 (ref 5–15)
BUN: 22 mg/dL — ABNORMAL HIGH (ref 6–20)
CALCIUM: 8.4 mg/dL — AB (ref 8.9–10.3)
CO2: 28 mmol/L (ref 22–32)
Chloride: 102 mmol/L (ref 101–111)
Creatinine, Ser: 0.54 mg/dL — ABNORMAL LOW (ref 0.61–1.24)
GFR calc Af Amer: >60 mL/min (ref >60)
GLUCOSE: 136 mg/dL — AB (ref 65–99)
Potassium: 3.5 mmol/L (ref 3.5–5.1)
SODIUM: 139 mmol/L (ref 135–145)

## 2016-08-13 LAB — CBC
HCT: 38.2 % — ABNORMAL LOW (ref 39.0–52.0)
Hemoglobin: 13.2 g/dL (ref 13.0–17.0)
MCH: 30.5 pg (ref 26.0–34.0)
MCHC: 34.6 g/dL (ref 30.0–36.0)
MCV: 88.2 fL (ref 78.0–100.0)
PLATELETS: 49 10*3/uL — AB (ref 150–400)
RBC: 4.33 MIL/uL (ref 4.22–5.81)
RDW: 13.5 % (ref 11.5–15.5)
WBC: 5.1 10*3/uL (ref 4.0–10.5)

## 2016-08-13 LAB — MAGNESIUM: Magnesium: 2 mg/dL (ref 1.7–2.4)

## 2016-08-13 MED ORDER — METHYLPREDNISOLONE SODIUM SUCC 40 MG IJ SOLR
40.0000 mg | INTRAMUSCULAR | Status: DC
  Administered 2016-08-14: 40 mg via INTRAVENOUS
  Filled 2016-08-13: qty 1

## 2016-08-13 MED ORDER — GLUCERNA SHAKE PO LIQD
237.0000 mL | Freq: Three times a day (TID) | ORAL | Status: DC
  Administered 2016-08-14 – 2016-08-21 (×15): 237 mL via ORAL
  Filled 2016-08-13 (×2): qty 237

## 2016-08-13 MED ORDER — FUROSEMIDE 10 MG/ML IJ SOLN
40.0000 mg | Freq: Two times a day (BID) | INTRAMUSCULAR | Status: DC
  Administered 2016-08-13: 40 mg via INTRAVENOUS
  Filled 2016-08-13 (×2): qty 4

## 2016-08-13 MED ORDER — INSULIN ASPART 100 UNIT/ML ~~LOC~~ SOLN
0.0000 [IU] | Freq: Three times a day (TID) | SUBCUTANEOUS | Status: DC
  Administered 2016-08-13: 8 [IU] via SUBCUTANEOUS
  Administered 2016-08-13: 11 [IU] via SUBCUTANEOUS
  Administered 2016-08-14: 2 [IU] via SUBCUTANEOUS
  Administered 2016-08-14 – 2016-08-15 (×3): 11 [IU] via SUBCUTANEOUS
  Administered 2016-08-15: 5 [IU] via SUBCUTANEOUS
  Administered 2016-08-15: 20 [IU] via SUBCUTANEOUS
  Administered 2016-08-16: 8 [IU] via SUBCUTANEOUS
  Administered 2016-08-16: 15 [IU] via SUBCUTANEOUS

## 2016-08-13 MED ORDER — PANTOPRAZOLE SODIUM 40 MG PO TBEC
40.0000 mg | DELAYED_RELEASE_TABLET | Freq: Every day | ORAL | Status: DC
  Administered 2016-08-13 – 2016-08-21 (×9): 40 mg via ORAL
  Filled 2016-08-13 (×9): qty 1

## 2016-08-13 MED ORDER — INSULIN ASPART 100 UNIT/ML ~~LOC~~ SOLN
3.0000 [IU] | Freq: Three times a day (TID) | SUBCUTANEOUS | Status: DC
  Administered 2016-08-13 – 2016-08-15 (×7): 3 [IU] via SUBCUTANEOUS

## 2016-08-13 MED ORDER — INSULIN ASPART 100 UNIT/ML ~~LOC~~ SOLN
0.0000 [IU] | Freq: Every day | SUBCUTANEOUS | Status: DC
  Administered 2016-08-13 – 2016-08-14 (×2): 2 [IU] via SUBCUTANEOUS
  Administered 2016-08-15: 5 [IU] via SUBCUTANEOUS

## 2016-08-13 NOTE — Progress Notes (Addendum)
Contacted ID regarding droplet precautions, said we can d/c it if the flu treatment is done and had no fever for seven days. D/ced the droplet isolation.   Wayne Benson

## 2016-08-13 NOTE — Discharge Summary (Signed)
Name: Wayne Benson MRN: [id number] DOB: 03-03-1956 61 y.o. PCP: [facility]  Date of Admission: 08/06/2016  7:53 AM Date of Discharge: 08/21/2016 Attending Physician: Tyson Alias, MD  Discharge Diagnosis: 1. Acute Hypoxic Respiratory Failure 2/2 complications of influenza A 2. Acute on chronic systolic CHF 3. Diabetic Ketoacidosis, Type 2 Diabetes, poorly controlled  Principal Problem:   Influenza A Active Problems:   Diabetes (HCC)   Esophageal varices in cirrhosis (HCC)   Tobacco abuse   GERD (gastroesophageal reflux disease)   Liver cirrhosis secondary to NASH (HCC)   Thrombocytopenia (HCC)   Pressure injury of skin   Respiratory failure (HCC)   Discharge Medications: Allergies as of 08/21/2016      Reactions   Duloxetine Hcl Other (See Comments)      Medication List    TAKE these medications   acetaminophen 500 MG tablet Commonly known as:  TYLENOL Take 1,500 mg by mouth every 6 (six) hours as needed for mild pain.   cetirizine 10 MG tablet Commonly known as:  ZYRTEC Take 10 mg by mouth daily.   chlorpheniramine-HYDROcodone 10-8 MG/5ML Suer Commonly known as:  TUSSIONEX Take 5 mLs by mouth every 12 (twelve) hours as needed for cough.   cyanocobalamin 500 MCG tablet Take 500 mcg by mouth daily.   feeding supplement (GLUCERNA SHAKE) Liqd Take 237 mLs by mouth 3 (three) times daily between meals.   ferrous sulfate 325 (65 FE) MG tablet Take 325 mg by mouth daily with breakfast.   folic acid 1 MG tablet Commonly known as:  FOLVITE Take 1 mg by mouth daily.   gabapentin 300 MG capsule Commonly known as:  NEURONTIN Take 900 mg by mouth at bedtime.   HUMULIN R 500 UNIT/ML injection Generic drug:  insulin regular human CONCENTRATED Inject 50-100 Units into the skin See admin instructions. 100 units in am, 50 units in afternoon, 100 units in pm   nadolol 20 MG tablet Commonly known as:  CORGARD Take 20 mg by mouth daily.     ondansetron 8 MG tablet Commonly known as:  ZOFRAN Take 8 mg by mouth every 8 (eight) hours as needed for nausea or vomiting.   pantoprazole 40 MG tablet Commonly known as:  PROTONIX Take 40 mg by mouth 2 (two) times daily.   predniSONE 20 MG tablet Commonly known as:  DELTASONE Take 2 tablets (40 mg total) by mouth daily with breakfast. For 4 days starting 08/22/16. Then take 1 tablet daily with breakfast for 5 days. Then take 1/2 tablet daily with breakfast for 5 days.   simvastatin 20 MG tablet Commonly known as:  ZOCOR Take 20 mg by mouth at bedtime.   zolpidem 5 MG tablet Commonly known as:  AMBIEN Take 5 mg by mouth at bedtime as needed for sleep.      Disposition and follow-up:   [name] was discharged from [facility] in stable condition.  At the hospital follow up visit please address:  1. Respiratory failure: d/c home with 1L via [place]. Please evaluate further need for oxygen. Ensure compliance with steroid taper. 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 This could be contributing. Heart failure: Ensure euvolemic and that he does not require daily lasix at home   Hgb A1C of 12, consider changes to insulin regimen   2.  Labs / imaging needed at time of follow-up: none  3.  Pending labs/ test needing follow-up: none  Follow-up Appointments: PCP  within 1 week  Hospital Course by problem list: Principal Problem:   Influenza A Active Problems:   Diabetes (HCC)   Esophageal varices in cirrhosis (HCC)   Tobacco abuse   GERD (gastroesophageal reflux disease)   Liver cirrhosis secondary to NASH (HCC)   Thrombocytopenia (HCC)   Pressure injury of skin   Respiratory failure (HCC)   Acute Hypoxic Respiratory Failure 2/2 complications of influenza A Presented 08/07/2011 with malaise, productive cough, nausea, vomiting, fever, chills and body aches. Was found to be +Influenza A and was treated with Tamiflu. He became  progressively hypoxic requiring BiPAP and was transferred to ICU out of intubation risk. Chest x-ray showed progressive bilateral infiltrates and there was concern for bacterial superinfection and was treated with Ceftriaxone for 8 days.  His respiratory status improved slowly and was eventually weaned from BiPAP to high flow O2 to 1L via [place]. He was started on high-dose steroids to assist his respiratory status and had difficulty weaning off due to worsening respiratory status. He was discharged home with a long steroid taper as well as 1L via [place].   Diabetic Ketoacidosis, Known Poorly Controlled Type 2 Diabetes On arrival, glucose levels were in the 500s, ketones in urine and increased anion gap. He was subsequently started on glucostabilizer DKA protocol with quick reversal.  A1c 12.0 on 08/06/2016.  Patient is on Humulin 50-100 units TID at home which was increased during admission with better glycemic control.   Acute on Chronic HFrEF EF 40-45% this admission. Patient is not on lasix at home. Patient had inspiratory crackles throughout admission but no lower extremity edema or JVD. Minimal ascites on exam and Creatinine was stable.  He was started on IV lasix during admission requiring up to Lasix 40 mg IV Q8H with improvement improvement in symptoms however lasix was discontinued without fluid overload.  Discharged home with close follow-up.   Lactic Acidosis On arrival lactic acid 3.8 and normalized with IV fluids.  Thrombocytopenia Platelets remained stable throughout admission.  This is likely secondary to cirrhosis. There was no evidence of bleeding during stay. SCDs for DVT prophylaxis.  Discharge Vitals:   BP 110/72 (BP Location: Left Arm)   Pulse 64   Temp 97 F (36.1 C) (Oral)   Resp 14   Ht  (1.778 m)   Wt 180 lb 5.4 oz (81.8 kg)   SpO2 100%   BMI 25.88 kg/m   Pertinent Labs, Studies, and Procedures:  ECHO 07/2016: LVEF 50-55% CTA without PE but did show decompensated  cirrhosis   Discharge Instructions: Please follow up within 1 week after discharge with your primary care physician. You were discharged home with home oxygen. Please address this at your follow-up visit. You were not discharged home with lasix. You will need to be evaluated for additional need for this medication at your follow-up appointment.   SignedNoemi Chapel, DO 08/23/2016, 1:58 PM   Pager: [phone]

## 2016-08-13 NOTE — Progress Notes (Signed)
Nutrition Follow-up  DOCUMENTATION CODES:   Non-severe (moderate) malnutrition in context of acute illness/injury  INTERVENTION:    Glucerna Shake po TID, each supplement provides 220 kcal and 10 grams of protein  NUTRITION DIAGNOSIS:   Malnutrition (Moderate) related to acute illness (influenza A) as evidenced by mild depletion of body fat, mild depletion of muscle mass.  Ongoing  GOAL:   Patient will meet greater than or equal to 90% of their needs  Met  MONITOR:   Diet advancement, PO intake, Labs, I & O's  ASSESSMENT:   61 yo male smoker presented with 3 days of cough, fever 101F and fatigue.  Found to have influenza A.    Patient no longer requiring BiPAP. Diet advanced to CHO modified, patient is consuming 100% of meals. From review of patient's meal choices, he is consuming ~1600-1700 kcal per day which does not quite meet his nutrition needs. Will add PO supplement. Labs and medications reviewed. CBG's: 129-166  Diet Order:  Diet Carb Modified Fluid consistency: Thin; Room service appropriate? Yes  Skin:  Wound (see comment) (stage I to buttocks)  Last BM:  4/21  Height:   Ht Readings from Last 1 Encounters:  08/06/16 _0  (1.778 m)    Weight:   Wt Readings from Last 1 Encounters:  08/11/16 180 lb 5.4 oz (81.8 kg)    Ideal Body Weight:  75.5 kg  BMI:  Body mass index is 25.88 kg/m.  Estimated Nutritional Needs:   Kcal:  2200-2400  Protein:  100-120 gm  Fluid:  2.2-2.4 L  EDUCATION NEEDS:   No education needs identified at this time  Molli Barrows, [place], [place], [place] Pager [phone] After Hours Pager [phone]

## 2016-08-13 NOTE — Care Management Note (Signed)
Case Management Note  Patient Details  Name: Wayne Benson MRN: [id number] Date of Birth: 06-16-1955  Subjective/Objective:   Pt admitted with RF 2nd to Flu and PNA and possible bacterial superinfection  Action/Plan:   PTA in independent from home with wife.  Pt is not stable per attending for transfer to [facility] hospital.  Pt is active with Beverly Milch - PCP and prescription medications supplies via [place].  Pt is on BIPAP - information obtained from spouse via phone.  CM left voicemail for April with the [facility] and also informed [facility] of admit.  CM will continue to follow for discharge needs   Expected Discharge Date:                  Expected Discharge Plan:  Home/Self Care  In-House Referral:     Discharge planning Services  CM Consult  Post Acute Care Choice:    Choice offered to:     DME Arranged:    DME Agency:     HH Arranged:    HH Agency:     Status of Service:     If discussed at [employer] of Stay Meetings, dates discussed:    Additional Comments: 08/13/2016  [facility] - read [facility]s.  Per review of documentation - [facility] does not believe that pt is appropriate at this time for transfer due to nearing discharge.    08/12/16 Pt is currently on HFNC, IV antibitoics, IV steriods.  Plan is to transfer out of [place].  CM contacted transfer liason to inform of status and documentation as requested - left VM Cherylann Parr, RN 08/13/2016, 2:01 PM

## 2016-08-13 NOTE — Evaluation (Signed)
Physical Therapy Evaluation Patient Details Name: Wayne Benson MRN: [id number] DOB: 09/04/1955 Today's Date: 08/13/2016   History of Present Illness  61 yo admitted with respiratory failure due to flu A and PNA s/p bipap. PMHx: DM, cirrhosis, thrombocytopenia  Clinical Impression  Pt pleasant, incontinent on arrival and unaware but eager to be OOB. Pt with decreased strength, balance, transfers and gait who will benefit from acute therapy to maximize mobility, function, gait and endurance to decrease burden of care and return pt to PLOF.   90-95% on 20L HR 76    Follow Up Recommendations Home health PT;Supervision/Assistance - 24 hour    Equipment Recommendations  None recommended by PT    Recommendations for Other Services OT consult     Precautions / Restrictions Precautions Precautions: Fall      Mobility  Bed Mobility Overal bed mobility: Needs Assistance Bed Mobility: Supine to Sit     Supine to sit: Supervision     General bed mobility comments: cues for sequence, use of rail and increased time  Transfers Overall transfer level: Needs assistance   Transfers: Sit to/from Stand Sit to Stand: Min guard         General transfer comment: cues for hand placement and safety guarding for balance pt with posterior lean on initial standing x 2 trials  Ambulation/Gait Ambulation/Gait assistance: Min assist Ambulation Distance (Feet): 30 Feet Assistive device: 1 person hand held assist Gait Pattern/deviations: Step-through pattern;Decreased stride length   Gait velocity interpretation: Below normal speed for age/gender General Gait Details: pt with unsteady gait requiring HHA and min assist for balance and stability. pt walked 5' then 7' with sats 90-95% on 20L. Limited by fatigue  Stairs            Wheelchair Mobility    Modified Rankin (Stroke Patients Only)       Balance Overall balance assessment: Needs assistance   Sitting balance-Leahy  Scale: Fair       Standing balance-Leahy Scale: Poor                               Pertinent Vitals/Pain Pain Assessment: No/denies pain    Home Living Family/patient expects to be discharged to:: Private residence Living Arrangements: Spouse/significant other Available Help at Discharge: Available 24 hours/day Type of Home: Mobile home Home Access: Stairs to enter   [employer] of Steps: 2 Home Layout: One level Home Equipment: Environmental consultant - 2 wheels      Prior Function Level of Independence: Independent         Comments: pt reports he is disabled, enjoys fishing and performed all his own ADLs     Hand Dominance        Extremity/Trunk Assessment   Upper Extremity Assessment Upper Extremity Assessment: Generalized weakness    Lower Extremity Assessment Lower Extremity Assessment: Generalized weakness    Cervical / Trunk Assessment Cervical / Trunk Assessment: Normal  Communication   Communication: No difficulties  Cognition Arousal/Alertness: Awake/alert Behavior During Therapy: Flat affect Overall Cognitive Status: Impaired/Different from baseline Area of Impairment: Safety/judgement                         Safety/Judgement: Decreased awareness of deficits     General Comments: pt incontinent of urine on arrival and unaware      General Comments      Exercises     Assessment/Plan  PT Assessment Patient needs continued PT services  PT Problem List Decreased strength;Decreased mobility;Decreased safety awareness;Decreased activity tolerance;Decreased balance;Decreased knowledge of use of DME;Cardiopulmonary status limiting activity;Decreased cognition       PT Treatment Interventions Gait training;Therapeutic exercise;Stair training;Balance training;Functional mobility training;Patient/family education;DME instruction;Therapeutic activities    PT Goals (Current goals can be found in the Care Plan section)   Acute Rehab PT Goals Patient Stated Goal: return home and to fishing PT Goal Formulation: With patient Time For Goal Achievement: 08/27/16 Potential to Achieve Goals: Good    Frequency Min 3X/week   Barriers to discharge Decreased caregiver support pt reports wife is home sick with the flu    Co-evaluation               End of Session Equipment Utilized During Treatment: Gait belt;Oxygen Activity Tolerance: Patient tolerated treatment well Patient left: in chair;with call bell/phone within reach;with nursing/sitter in room;with chair alarm set Nurse Communication: Mobility status;Precautions PT Visit Diagnosis: Other abnormalities of gait and mobility (R26.89);Unsteadiness on feet (R26.81);Muscle weakness (generalized) (M62.81)    Time: 5409-8119 PT Time Calculation (min) (ACUTE ONLY): 16 min   Charges:   PT Evaluation $PT Eval Moderate Complexity: 1 Procedure     PT G Codes:       Delaney Meigs, PT [phone]   Makoa Satz B Tanveer Dobberstein 08/13/2016, 12:05 PM

## 2016-08-13 NOTE — Progress Notes (Signed)
Took pt. Off of heated high flow & placed pt. On a 10L [place] high flow cannula. Pt. Is tolerating it well at this time with SAT's of 96%.

## 2016-08-13 NOTE — Progress Notes (Signed)
Interim History: Mr. Feldner is a 53 M with PMHx of Cirrhosis 2/2 NASH with Esophageal Varices, T2DM, GERD, h/o DVT s/p IVC filter, and tobacco abuse who originally presented to the ED on 08/06/2016 with cough, shortness of breath, nausea with non-bloody vomiting, diarrhea and found to Influenza A positive and started on Tamiflu. Patient was originally satting well on room air the morning of admission, but progressively hypoxic with an increased oxygen requirement throughout the night, requiring BiPAP. Given concern for underlying pneumonia, patient was started on Vancomycin and Zosyn to cover MRSA and aspiration. CXR showed progressive bilateral infiltrates. He was started on Duonebs, Solumedrol, Lasix, and Albuterol in addition to antibiotics. Patient continues to require BiPAP intermittently for hypoxia.   Subjective: Patient was seen and examined this morning. He states he feels better. He continues to intermittently feel short of breath and have cough, but denies nausea, vomiting, diarrhea, fever, chills, lightheadedness. He denies abdominal pain, abdominal distention. He had small melenic stool 5 days without recurrence.   Objective: Vital signs in last 24 hours: Vitals:   08/13/16 0500 08/13/16 0600 08/13/16 0745 08/13/16 0758  BP: 92/65 111/64  110/72  Pulse: (!) 43 (!) 42  65  Resp: 20 (!) 21  18  Temp:   97 F (36.1 C)   TempSrc:   Oral   SpO2: 95% 95%  100%  Weight:      Height:       Physical Exam General: Vital signs reviewed.  Patient is in no acute distress and cooperative with exam.  Cardiovascular: RRR, no murmurs, gallops, or rubs. No lower extremity edema bilaterally, pulses symmetric and intact bilaterally. No cyanosis or clubbing. Pulmonary/Chest: BiPAP in place. Inspiratory crackles in bilateral lower lung fields. No wheezes or coarse rhonchi. Abdominal: Soft, non-tender, mildly distended, BS +, no guarding present. +minimal fluid wave Neurological: Awake, alert,  oriented. Answers all questions appropriately. Moving all extremities equally.  Skin: Warm, dry and intact. No rashes or erythema. Psychiatric: Normal mood and affect. speech and behavior is normal. Cognition and memory are normal.   LINES / TUBES: PIV 4/17>>  CULTURES: Blood Cx 4/17 >>NGx5d final Influenza PCR 4/17 >> Influenza A positive Pneumococcal Ag 4/18 >> negative Legionella Ag 4/18 >> negative C diff 4/19>> negative  ANTIBIOTICS: Rocephin 4/17 >> 4/17 Zithromax 4/17 >> 4/17 Tamiflu 4/17 >> 4/22 Vancomycin 4/17 >> 4/20 Zosyn 4/17 >> 4/20 Ceftriaxone 4/20>> 4/24  Assessment/Plan: Principal Problem:   Influenza A Active Problems:   Diabetes (HCC)   Esophageal varices in cirrhosis (HCC)   Tobacco abuse   GERD (gastroesophageal reflux disease)   Liver cirrhosis secondary to NASH (HCC)   Thrombocytopenia (HCC)   Pressure injury of skin  Mr. Milbourne is a 65 M with PMHx of Cirrhosis 2/2 NASH with Esophageal Varices, T2DM, GERD, h/o DVT s/p IVC filter, and tobacco abuse who originally presented to the ED on 08/06/2016 with cough, shortness of breath, nausea with non-bloody vomiting, diarrhea and found to Influenza A positive  Acute Hypoxic Respiratory Failure 2/2 Influenza A: With possible bacterial superinfection and acute lung injury. Patient finished course of Tamiflu and was transitioned from Vanc/Zosyn to Ceftriaxone on 4/20. Today is day 8 of antibiotics. Patient required intermittent BiPAP for a prolonged period of time in the ICU, but did not require intubation. Patient has been afebrile for 5 days, no leukocytosis, blood cultures NGTD. CXR from 4/23 showed stable mild central pulmonary vascular congestion and stable bilateral basilar opacities. -Droplet precautions -D/C Ceftriaxone given 8  days of antibiotics -Tussionex Q12H prn cough -Duonebs TID (day 8) -Mucinex BID -Albuterol Q2H prn  -Solumedrol 40 mg BID (60 Q8H for 2 days, then 60 mg BID for 2 days, then 40  mg BID for 2 days), continue to wean, will change to Solumedrol 40 mg QD for 2 days -PT Eval -BiPAP prn, High Flow [place] as able  Chronic HFrEF: EF 40-45% this admission. Given hypoxic respiratory failure, vascular congestion and ascites, patient was started on Lasix 40 mg IV Q8H on 4/20 with improvement. Patient -3,475 mL yesterday out and net negative -2,811 mL yesterday. Total Net since admission is -2,852. Weights are not being checked daily. 177 > 175 > 182 > 182 >182 > 180. Patient continues to have inspiratory crackles but no lower extremity edema or JVD. Minimal ascites on exam. Creatinine stable.  -Lasix 40 mg IV Q8H, will decreased to Lasix 40 mg IV BID -Nadolol 20 mg QD  -Daily weights -I/Os  Cirrhosis 2/2 NASH with Esophageal Varices:  Patient did have reported melanotic stools 5 days ago without recurrence. Patient follows with Dr. Dulce Sellar. Denies hematemesis, melena, hematochezia, abdominal pain. Patient is on nadolol 20 mg QD. Remote history of paracentesis 3-4 years ago. EGD 01/2016 showed Grade II esophageal varices, reflux esophagitis, Portal hypertensive gastropathy. Recommendations were for Pantoprazole 40 mg po qd indefinitely, Nadolol 20 mg po qd indefinitely and [place] MELD score 10- 6.0% mortality in the next 3 months. Would consider transplant given MELD score. -Nadolol 20 mg QD -Protonix increased from 40 mg QD to 40 mg BID on 4/20, will decrease back to QD given stable hemoglobin and no recurrence of melena  Variable Heart Rate with Asymptomatic Sinus Bradycardia: HR trend ranges from 30s to 70s recently. EKG shows Sinus Rhythm with PVCs on 4/17 and 4/18. Patient is on nadolol. No evidence of AV block. Patient may not be a candidate for BB if persistent or symptomatic. Can also consider decreased dose. -Telemetry -Monitor given concurrent use of nadolol, may need to d/c if symptomatic  T2DM: A1c 12.0 on 08/06/2016.  CBGs controlled here. Patient is on Humulin 50-100 units TID at home. -Patient will need adjustment of insulin regimen on discharge -Lantus 30 units QD -Will change SSI Q4H to SSI ACHS -Add Novolog 3 units TID WC -Gabapentin 900 mg QHS -Simvastatin 20 mg QD  Thrombocytopenia: Platelets 49. Likely secondary to cirrhosis. No evidence of bleeding. -Monitor -SCDs  DVT/PE ppx: IVC in place, SCDs FEN: CM CODE: FULL  Dispo: Anticipated discharge in approximately 2 day(s).   LOS: 7 days   Karlene Lineman, DO PGY-3 Internal Medicine Resident Pager # [phone] 08/13/2016 8:24 AM

## 2016-08-14 DIAGNOSIS — E119 Type 2 diabetes mellitus without complications: Secondary | ICD-10-CM

## 2016-08-14 DIAGNOSIS — K7460 Unspecified cirrhosis of liver: Secondary | ICD-10-CM

## 2016-08-14 DIAGNOSIS — I8510 Secondary esophageal varices without bleeding: Secondary | ICD-10-CM

## 2016-08-14 DIAGNOSIS — I5022 Chronic systolic (congestive) heart failure: Secondary | ICD-10-CM

## 2016-08-14 DIAGNOSIS — Z794 Long term (current) use of insulin: Secondary | ICD-10-CM

## 2016-08-14 DIAGNOSIS — K7581 Nonalcoholic steatohepatitis (NASH): Secondary | ICD-10-CM

## 2016-08-14 LAB — BASIC METABOLIC PANEL
Anion gap: 7 (ref 5–15)
BUN: 29 mg/dL — AB (ref 6–20)
CHLORIDE: 101 mmol/L (ref 101–111)
CO2: 29 mmol/L (ref 22–32)
Calcium: 8.3 mg/dL — ABNORMAL LOW (ref 8.9–10.3)
Creatinine, Ser: 0.58 mg/dL — ABNORMAL LOW (ref 0.61–1.24)
GFR calc Af Amer: >60 mL/min (ref >60)
GFR calc non Af Amer: >60 mL/min (ref >60)
Glucose, Bld: 128 mg/dL — ABNORMAL HIGH (ref 65–99)
POTASSIUM: 3.2 mmol/L — AB (ref 3.5–5.1)
Sodium: 137 mmol/L (ref 135–145)

## 2016-08-14 LAB — GLUCOSE, CAPILLARY
GLUCOSE-CAPILLARY: 141 mg/dL — AB (ref 65–99)
GLUCOSE-CAPILLARY: 240 mg/dL — AB (ref 65–99)
Glucose-Capillary: 306 mg/dL — ABNORMAL HIGH (ref 65–99)
Glucose-Capillary: 349 mg/dL — ABNORMAL HIGH (ref 65–99)
Glucose-Capillary: 266 mg/dL — ABNORMAL HIGH (ref 65–99)

## 2016-08-14 MED ORDER — INSULIN ASPART 100 UNIT/ML ~~LOC~~ SOLN
8.0000 [IU] | Freq: Once | SUBCUTANEOUS | Status: AC
  Administered 2016-08-14: 8 [IU] via SUBCUTANEOUS

## 2016-08-14 MED ORDER — PREDNISONE 20 MG PO TABS
40.0000 mg | ORAL_TABLET | Freq: Every day | ORAL | Status: AC
  Administered 2016-08-15: 40 mg via ORAL
  Filled 2016-08-14: qty 2

## 2016-08-14 MED ORDER — POTASSIUM CHLORIDE 20 MEQ/15ML (10%) PO SOLN
40.0000 meq | Freq: Once | ORAL | Status: AC
  Administered 2016-08-14: 40 meq via ORAL
  Filled 2016-08-14: qty 30

## 2016-08-14 MED ORDER — FUROSEMIDE 40 MG PO TABS
40.0000 mg | ORAL_TABLET | Freq: Two times a day (BID) | ORAL | Status: DC
  Administered 2016-08-14 – 2016-08-17 (×7): 40 mg via ORAL
  Filled 2016-08-14 (×7): qty 1

## 2016-08-14 NOTE — Progress Notes (Signed)
   Subjective: Patient was evaluated this morning on rounds. He denies having to use BiPAP overnight. He states that his breathing is improving.  He states that he was able to ambulate yesterday with assistance and felt weak. He denies shortness of breath or chest pain.  Objective:  Vital signs in last 24 hours: Vitals:   08/14/16 0400 08/14/16 0413 08/14/16 0700 08/14/16 0801  BP: 107/70  100/71   Pulse: 64  65   Resp: (!) 51  (!) 21   Temp:  98 F (36.7 C)  97.6 F (36.4 C)  TempSrc:  Oral  Oral  SpO2: 95%  95%   Weight:      Height:       Physical Exam  Cardiovascular: Normal rate and regular rhythm.  Exam reveals no gallop and no friction rub.   No murmur heard. Pulmonary/Chest: Effort normal. No respiratory distress.  Bibasilar crackles  Abdominal: Soft. He exhibits no distension. There is no tenderness.  Musculoskeletal: He exhibits no edema.  Neurological: He is alert.  Skin: Skin is warm and dry.     Assessment/Plan:  Principal Problem:   Influenza A Active Problems:   Diabetes (HCC)   Esophageal varices in cirrhosis (HCC)   Tobacco abuse   GERD (gastroesophageal reflux disease)   Liver cirrhosis secondary to NASH (HCC)   Thrombocytopenia (HCC)   Pressure injury of skin   Respiratory failure (HCC)  Acute Hypoxic Respiratory Failure 2/2 Influenza A Patient did not require BiPAP overnight and is currently on 5 L of high flow nasal cannula.  Respiratory status continues to improve.  Patient will have one more dose of Solu-Medrol and then transition to prednisone 40 once tomorrow.  PT evaluated patient yesterday and recommended Home Health. -Tussionex Q12H prn cough -Duonebs TID  -Mucinex BID -Albuterol Q2H prn  -solumedrol  QD  Chronic HFrEF EF 40-45% this admission.  Patient has faint bibasilar crackles on exam.  Will transition from IV lasix to PO.  Weights have not been checked daily.  Output yesterday was 1.3 liters.  Total net output since admission  is 6L. -Decreased Lasix 40 mg IV BID to oral Lasix  BID -Nadolol 20 mg QD  -Daily weights -I/Os  Cirrhosis 2/2 NASH with Esophageal Varices No signs of bleeding or altered mental status.   -Nadolol 20 mg QD - protonix  QD  T2DM A1c 12.0 on 08/06/2016. CBGs controlled here. Patient is on Humulin 50-100 units TID at home. -Lantus 30 units QD -SSI ACHS -Novolog 3 units TID WC -Gabapentin 900 mg QHS -Simvastatin 20 mg QD  DVT/PE ppx: IVC in place, SCDs FEN: CM CODE: FULL  Dispo: Anticipated discharge pending clinical improvement   Camelia Phenes, DO 08/14/2016, 8:10 AM Pager: [phone]

## 2016-08-14 NOTE — Care Management Note (Addendum)
Case Management Note  Patient Details  Name: Wayne Benson MRN: [id number] Date of Birth: [birth year]  Subjective/Objective:   Pt admitted with RF 2nd to Flu and PNA and possible bacterial superinfection  Action/Plan:   PTA in independent from home with wife.  Pt is not stable per attending for transfer to [facility] hospital.  Pt is active with Beverly Milch - PCP and prescription medications supplies via [place].  Pt is on BIPAP - information obtained from spouse via phone.  CM left voicemail for April with the [facility] and also informed [facility] of admit.  CM will continue to follow for discharge needs   Expected Discharge Date:                  Expected Discharge Plan:  Home/Self Care  In-House Referral:     Discharge planning Services  CM Consult  Post Acute Care Choice:    Choice offered to:     DME Arranged:    DME Agency:     HH Arranged:    HH Agency:     Status of Service:     If discussed at [employer] of Stay Meetings, dates discussed:    Additional  08/14/2016  CM faxed orders to [facility] as requested.  Pt offered choice and pt chose [facility].  Agency contacted and will accept pt once contacted by [facility].    CM requested HH order from attending via sticky tab note and direct text page Per [facility] transfer coordinator - pt can pick any agency of choice that accepts [facility].  Once orders are written - CM will need to fax order and H&P to [place] -  [place] will then process order internally and initiate insurance auth for [facility]   08/13/16 Pt updated [facility] on current status - read clinicals.  Per review of documentation - [facility] transfer coordinator does not believe that pt is appropriate at this time for transfer due to nearing discharge.    08/12/16 Pt is currently on HFNC, IV antibitoics, IV steriods.  Plan is to transfer out of [place].  CM contacted transfer liason to inform of status and documentation as requested - left VM Cherylann Parr, RN 08/14/2016, 9:32 AM

## 2016-08-15 ENCOUNTER — Inpatient Hospital Stay (HOSPITAL_COMMUNITY): Payer: No Typology Code available for payment source

## 2016-08-15 LAB — BASIC METABOLIC PANEL
ANION GAP: 8 (ref 5–15)
Anion gap: 8 (ref 5–15)
BUN: 18 mg/dL (ref 6–20)
BUN: 16 mg/dL (ref 6–20)
CALCIUM: 8.6 mg/dL — AB (ref 8.9–10.3)
CHLORIDE: 100 mmol/L — AB (ref 101–111)
CO2: 26 mmol/L (ref 22–32)
CO2: 25 mmol/L (ref 22–32)
CREATININE: 0.71 mg/dL (ref 0.61–1.24)
Calcium: 8.4 mg/dL — ABNORMAL LOW (ref 8.9–10.3)
Chloride: 101 mmol/L (ref 101–111)
Creatinine, Ser: 0.86 mg/dL (ref 0.61–1.24)
GFR calc Af Amer: >60 mL/min (ref >60)
GFR calc non Af Amer: >60 mL/min (ref >60)
GLUCOSE: 292 mg/dL — AB (ref 65–99)
GLUCOSE: 451 mg/dL — AB (ref 65–99)
POTASSIUM: 4.2 mmol/L (ref 3.5–5.1)
Potassium: 4.3 mmol/L (ref 3.5–5.1)
SODIUM: 134 mmol/L — AB (ref 135–145)
Sodium: 134 mmol/L — ABNORMAL LOW (ref 135–145)

## 2016-08-15 LAB — GLUCOSE, CAPILLARY
GLUCOSE-CAPILLARY: 213 mg/dL — AB (ref 65–99)
GLUCOSE-CAPILLARY: 316 mg/dL — AB (ref 65–99)
GLUCOSE-CAPILLARY: 384 mg/dL — AB (ref 65–99)
Glucose-Capillary: 506 mg/dL (ref 65–99)

## 2016-08-15 LAB — CBC
HEMATOCRIT: 41.9 % (ref 39.0–52.0)
HEMOGLOBIN: 14.4 g/dL (ref 13.0–17.0)
MCH: 30.9 pg (ref 26.0–34.0)
MCHC: 34.4 g/dL (ref 30.0–36.0)
MCV: 89.9 fL (ref 78.0–100.0)
Platelets: 67 10*3/uL — ABNORMAL LOW (ref 150–400)
RBC: 4.66 MIL/uL (ref 4.22–5.81)
RDW: 13.7 % (ref 11.5–15.5)
WBC: 6.3 10*3/uL (ref 4.0–10.5)

## 2016-08-15 MED ORDER — INSULIN GLARGINE 100 UNIT/ML ~~LOC~~ SOLN
40.0000 [IU] | Freq: Every day | SUBCUTANEOUS | Status: DC
  Administered 2016-08-16: 40 [IU] via SUBCUTANEOUS
  Filled 2016-08-15: qty 0.4

## 2016-08-15 MED ORDER — POTASSIUM CHLORIDE CRYS ER 20 MEQ PO TBCR
40.0000 meq | EXTENDED_RELEASE_TABLET | Freq: Two times a day (BID) | ORAL | Status: DC
Start: 2016-08-15 — End: 2016-08-15

## 2016-08-15 MED ORDER — INSULIN GLARGINE 100 UNIT/ML ~~LOC~~ SOLN: 35.0000 [IU] | Freq: Every day | SUBCUTANEOUS | Status: DC

## 2016-08-15 MED ORDER — INSULIN GLARGINE 100 UNIT/ML ~~LOC~~ SOLN: 40.0000 [IU] | Freq: Every day | SUBCUTANEOUS | Status: DC

## 2016-08-15 MED ORDER — PREDNISONE 20 MG PO TABS
40.0000 mg | ORAL_TABLET | Freq: Once | ORAL | Status: AC
  Administered 2016-08-16: 40 mg via ORAL
  Filled 2016-08-15: qty 2

## 2016-08-15 MED ORDER — INSULIN ASPART 100 UNIT/ML ~~LOC~~ SOLN
5.0000 [IU] | Freq: Three times a day (TID) | SUBCUTANEOUS | Status: DC
  Administered 2016-08-15 – 2016-08-16 (×3): 5 [IU] via SUBCUTANEOUS

## 2016-08-15 NOTE — Progress Notes (Signed)
Md paged regarding pts CBG of 506. Ordered to give 20 units and labs ordered. RN will continue to monitor.

## 2016-08-15 NOTE — Progress Notes (Signed)
Physical Therapy Treatment Patient Details Name: Wayne Benson MRN: [id number] DOB: 1956-04-16 Today's Date: 08/15/2016    History of Present Illness 61 yo admitted with respiratory failure due to flu A and PNA s/p bipap. PMHx: DM, cirrhosis, thrombocytopenia    PT Comments    Pt making good progress with mobility. Maintained SpO2 >92 on 6L of O2.   Follow Up Recommendations  Home health PT;Supervision/Assistance - 24 hour     Equipment Recommendations  None recommended by PT    Recommendations for Other Services OT consult     Precautions / Restrictions Precautions Precautions: Fall Restrictions Weight Bearing Restrictions: No    Mobility  Bed Mobility Overal bed mobility: Needs Assistance Bed Mobility: Supine to Sit     Supine to sit: Supervision;HOB elevated     General bed mobility comments: Incr time and use of rail. Verbal cues for technique  Transfers Overall transfer level: Needs assistance Equipment used: Rolling walker (2 wheeled) Transfers: Sit to/from Stand Sit to Stand: Min guard         General transfer comment: Verbal cues for hand placement  Ambulation/Gait Ambulation/Gait assistance: Min guard Ambulation Distance (Feet): 200 Feet Assistive device: Rolling walker (2 wheeled) Gait Pattern/deviations: Step-through pattern;Decreased stride length Gait velocity: decr Gait velocity interpretation: Below normal speed for age/gender General Gait Details: Good use of walker. Pt amb on 6L of O2 with SpO2 >92% throughout walk   Stairs            Wheelchair Mobility    Modified Rankin (Stroke Patients Only)       Balance Overall balance assessment: Needs assistance Sitting-balance support: No upper extremity supported;Feet supported Sitting balance-Leahy Scale: Good     Standing balance support: No upper extremity supported Standing balance-Leahy Scale: Fair                              Cognition Arousal/Alertness:  Awake/alert Behavior During Therapy: WFL for tasks assessed/performed Overall Cognitive Status: Impaired/Different from baseline Area of Impairment: Memory                     Memory: Decreased short-term memory                Exercises      General Comments        Pertinent Vitals/Pain Pain Assessment: No/denies pain    Home Living                      Prior Function            PT Goals (current goals can now be found in the care plan section) Progress towards PT goals: Progressing toward goals    Frequency    Min 3X/week      PT Plan Current plan remains appropriate    Co-evaluation             End of Session Equipment Utilized During Treatment: Gait belt;Oxygen Activity Tolerance: Patient tolerated treatment well Patient left: in chair;with call bell/phone within reach;with chair alarm set Nurse Communication: Mobility status PT Visit Diagnosis: Unsteadiness on feet (R26.81);Muscle weakness (generalized) (M62.81)     Time: 4098-1191 PT Time Calculation (min) (ACUTE ONLY): 28 min  Charges:  $Gait Training: 23-37 mins                    G Codes:       [employer]  PT [phone]    Angelina Ok [facility] 08/15/2016, 2:54 PM

## 2016-08-15 NOTE — Progress Notes (Signed)
   Subjective: Patient was evaluated this morning on rounds.  Patient reported his oxygen level went down when his nasal cannula came off while sleeping but currently does not have difficulty breathing.  He was eating breakfast without difficulty this morning.  Objective:  Vital signs in last 24 hours: Vitals:   08/15/16 0800 08/15/16 0857 08/15/16 0900 08/15/16 1000  BP: 110/63  107/63 (!) 98/50  Pulse: 81  90 83  Resp: (!) 36  (!) 26 (!) 32  Temp:      TempSrc:      SpO2: (!) 86% (!) 89% 92% 95%  Weight:      Height:       Physical Exam  Cardiovascular: Normal rate, regular rhythm and normal heart sounds.  Exam reveals no gallop and no friction rub.   No murmur heard. Pulmonary/Chest: Effort normal and breath sounds normal. No respiratory distress. He has no wheezes.  Abdominal: Soft. He exhibits no distension. There is no tenderness.  Musculoskeletal: He exhibits no edema.  Neurological: He is alert.  Skin: Skin is warm and dry.     Assessment/Plan:  Principal Problem:   Influenza A Active Problems:   Diabetes (HCC)   Esophageal varices in cirrhosis (HCC)   Tobacco abuse   GERD (gastroesophageal reflux disease)   Liver cirrhosis secondary to NASH (HCC)   Thrombocytopenia (HCC)   Pressure injury of skin   Respiratory failure (HCC)  Acute Hypoxic Respiratory Failure 2/2 Influenza A Patient continues to not require BiPAP and is currently on 5 L of high flow nasal cannula.  He dropped to 86% O2 when off of high flow nasal cannula.  Will need to start weaning slowly off oxygen.  Patient has been transitioned to prednisone  once daily.   -Tussionex Q12H prn cough -Duonebs TID  -Mucinex BID -Albuterol Q2H prn  -Prednisone  QD - PT  Chronic HFrEF EF 40-45% this admission.  No bibasilar crackles on exam.  Weights have not been checked daily.  Output yesterday was 2.4 liters.  Total net output since admission is 7L. -oral Lasix  BID -Nadolol 20 mg QD    -Daily weights -I/Os  Cirrhosis 2/2 NASH with Esophageal Varices No signs of bleeding or altered mental status.   -Nadolol 20 mg QD - protonix  QD  T2DM A1c 12.0 on 08/06/2016. Patient is on Humulin 50-100 units TID at home. Patient is eating full regular breakfasts now.  CBG fasting is now running high.  Will increase Lantus to 40 units - increasing Lantus 40 units QD -SSI ACHS -Novolog 3 units TID WC -Gabapentin 900 mg QHS -Simvastatin 20 mg QD  DVT/PE ppx: IVC in place, SCDs FEN: CM CODE: FULL  Dispo: Anticipated discharge pending clinical improvement   Camelia Phenes, DO 08/15/2016, 10:57 AM Pager: [phone]

## 2016-08-15 NOTE — Progress Notes (Signed)
Inpatient Diabetes Program Recommendations  AACE/ADA: New Consensus Statement on Inpatient Glycemic Control (2015)  Target Ranges:  Prepandial:   less than 140 mg/dL      Peak postprandial:   less than 180 mg/dL (1-2 hours)      Critically ill patients:  140 - 180 mg/dL   Lab Results  Component Value Date   GLUCAP 213 (H) 08/15/2016   HGBA1C 12.0 (H) 08/06/2016    Review of Glycemic ControlResults for SALAM, MICUCCI (MRN [id number]) as of 08/15/2016 10:35  Ref. Range 08/14/2016 11:51 08/14/2016 15:48 08/14/2016 18:25 08/14/2016 21:49 08/15/2016 07:37 08/15/2016 08:44  Glucose-Capillary Latest Ref Range: 65 - 99 mg/dL 409 (H) 811 (H) 914 (H) 240 (H) 213 (H)     Inpatient Diabetes Program Recommendations:  Please consider increae in lantus to 40 units as fastings now are running high.

## 2016-08-15 NOTE — Progress Notes (Signed)
Pt desat to 86-90% this afternoon.  BBSH w/ right side crackles now.  MD notified- per MD she will order c-xray.  No distress noted, no increased WOB noted currently.  HFNC increased up to 14 lpm. RN in room and aware.

## 2016-08-16 DIAGNOSIS — R0902 Hypoxemia: Secondary | ICD-10-CM

## 2016-08-16 DIAGNOSIS — J80 Acute respiratory distress syndrome: Secondary | ICD-10-CM

## 2016-08-16 LAB — BLOOD GAS, ARTERIAL
Acid-Base Excess: 3.8 mmol/L — ABNORMAL HIGH (ref 0.0–2.0)
Bicarbonate: 27 mmol/L (ref 20.0–28.0)
Drawn by: 246101
O2 CONTENT: 15 L/min
O2 Saturation: 89 %
PCO2 ART: 35.4 mmHg (ref 32.0–48.0)
PH ART: 7.495 — AB (ref 7.350–7.450)
PO2 ART: 54.8 mmHg — AB (ref 83.0–108.0)
Patient temperature: 98.6

## 2016-08-16 LAB — BASIC METABOLIC PANEL
Anion gap: 6 (ref 5–15)
BUN: 16 mg/dL (ref 6–20)
CALCIUM: 8.7 mg/dL — AB (ref 8.9–10.3)
CO2: 28 mmol/L (ref 22–32)
Chloride: 101 mmol/L (ref 101–111)
Creatinine, Ser: 0.62 mg/dL (ref 0.61–1.24)
GFR calc Af Amer: >60 mL/min (ref >60)
GFR calc non Af Amer: >60 mL/min (ref >60)
GLUCOSE: 318 mg/dL — AB (ref 65–99)
Potassium: 3.7 mmol/L (ref 3.5–5.1)
Sodium: 135 mmol/L (ref 135–145)

## 2016-08-16 LAB — GLUCOSE, CAPILLARY
GLUCOSE-CAPILLARY: 398 mg/dL — AB (ref 65–99)
GLUCOSE-CAPILLARY: 217 mg/dL — AB (ref 65–99)
Glucose-Capillary: 282 mg/dL — ABNORMAL HIGH (ref 65–99)
Glucose-Capillary: 353 mg/dL — ABNORMAL HIGH (ref 65–99)

## 2016-08-16 MED ORDER — INSULIN REGULAR HUMAN (CONC) 500 UNIT/ML ~~LOC~~ SOPN
50.0000 [IU] | PEN_INJECTOR | Freq: Every day | SUBCUTANEOUS | Status: DC
  Administered 2016-08-16 – 2016-08-18 (×3): 50 [IU] via SUBCUTANEOUS
  Filled 2016-08-16: qty 3

## 2016-08-16 MED ORDER — INSULIN REGULAR HUMAN (CONC) 500 UNIT/ML ~~LOC~~ SOPN
50.0000 [IU] | PEN_INJECTOR | Freq: Every day | SUBCUTANEOUS | Status: DC
  Administered 2016-08-17 – 2016-08-19 (×3): 50 [IU] via SUBCUTANEOUS
  Filled 2016-08-16: qty 3

## 2016-08-16 MED ORDER — INSULIN REGULAR HUMAN (CONC) 500 UNIT/ML ~~LOC~~ SOLN: 50.0000 [IU] | Freq: Once | SUBCUTANEOUS | Status: DC

## 2016-08-16 MED ORDER — INSULIN REGULAR HUMAN (CONC) 500 UNIT/ML ~~LOC~~ SOLN: 50.0000 [IU] | Freq: Every day | SUBCUTANEOUS | Status: DC

## 2016-08-16 MED ORDER — INSULIN REGULAR HUMAN (CONC) 500 UNIT/ML ~~LOC~~ SOLN: 50.0000 [IU] | SUBCUTANEOUS | Status: DC

## 2016-08-16 MED ORDER — INSULIN REGULAR HUMAN (CONC) 500 UNIT/ML ~~LOC~~ SOPN
25.0000 [IU] | PEN_INJECTOR | Freq: Every day | SUBCUTANEOUS | Status: DC
  Administered 2016-08-17 – 2016-08-19 (×3): 25 [IU] via SUBCUTANEOUS
  Filled 2016-08-16: qty 3

## 2016-08-16 MED ORDER — POTASSIUM CHLORIDE CRYS ER 20 MEQ PO TBCR
40.0000 meq | EXTENDED_RELEASE_TABLET | Freq: Once | ORAL | Status: AC
  Administered 2016-08-16: 40 meq via ORAL
  Filled 2016-08-16: qty 2

## 2016-08-16 NOTE — Progress Notes (Signed)
Pt placed on heated high flow Bruce at 30 lpm and 100% per discussion w/ MD, and per ABG results.  Pt tol well so far, no distress noted.  Sats slowly improving to 93-94% currently.

## 2016-08-16 NOTE — Progress Notes (Addendum)
Failed IV access attempt by RN x1 and IV team x2. IV team RN recommends patient to have EJ or other access inserted by MD.  IMTS notified and they have advised to consult PCCM for placement as it is not a procedure performed by their group at this time.   PCCM advised to call IR for assistance as they are not able to place EJ.    IR advised they have an emergency at this time and are not able to assist with IV access.   PCCM made aware, request PICC team to place EJ.  IV Team advises that no PICC RN available night of 4/27 and PICC RN cannot place EJ.  PCCM advised of that they will address CTa access on 4/28 since PICC Team cannot place.  IV Team consulted to get any available access on patient possible in case of emergencies until CTa access can be gained.

## 2016-08-16 NOTE — Progress Notes (Signed)
Speech therapist is working with the patient at the moment.

## 2016-08-16 NOTE — Progress Notes (Signed)
   Subjective: Patient was provided this morning on rounds. He reports continuing to have a cough. He denies chest pain. He states that overnight his oxygen levels drop to the mid 80s and had difficulty breathing and has required more oxygen.  Objective:  Vital signs in last 24 hours: Vitals:   08/16/16 0730 08/16/16 0835 08/16/16 0900 08/16/16 1000  BP: 127/73  125/84 110/71  Pulse: 84  82 72  Resp: (!) 28  (!) 21 19  Temp: 98.5 F (36.9 C)     TempSrc: Oral     SpO2: 92% 94% 92% 92%  Weight:      Height:       Physical Exam  Cardiovascular: Normal rate, regular rhythm and normal heart sounds.   Pulmonary/Chest: Effort normal. He has no wheezes.  Right sided crackles  Abdominal: Soft. He exhibits no distension. There is no tenderness.  Musculoskeletal: He exhibits no edema.  Neurological: He is alert.  Skin: Skin is warm and dry.  Psychiatric: Mood and affect normal.    Assessment/Plan:  Principal Problem:   Influenza A Active Problems:   Diabetes (HCC)   Esophageal varices in cirrhosis (HCC)   Tobacco abuse   GERD (gastroesophageal reflux disease)   Liver cirrhosis secondary to NASH (HCC)   Thrombocytopenia (HCC)   Pressure injury of skin   Respiratory failure (HCC)  Acute Hypoxic Respiratory Failure 2/2 Influenza A Overnight patient's O2 satts dropped to 86% on HFNC.  Patient is requiring 15L of high flow nasal cannula currently.  He dropped to 88% when on 14L HFNC during rounds.  Chest x-ray yesterday showed fine interstitial prominence noted bilaterally, although chest x-ray looks improved from previous on 4/23.  Will ask PCCM to re-evaluate patient.  Obtaining ABG. -Tussionex Q12H prn cough -Duonebs TID  -Mucinex BID -Albuterol Q2H prn  -Prednisone  QD -ABG - Appreciate PCCMs recommendations - PT  Chronic HFrEF EF 40-45% 12/2015.  Right sided crackles on exam. Output yesterday was 2 liters. Total net output since admission is 10L.  Getting repeat  ECHO -oral Lasix  BID -Nadolol 20 mg QD  -Daily weights -I/Os - Echo  Cirrhosis 2/2 NASH with Esophageal Varices No signs of bleeding or altered mental status.  -Nadolol 20 mg QD - protonix  QD  T2DM A1c 12.0 on 08/06/2016. Patient is on Humulin 50-100 units TID at home. Due to hyperglycemia trends in the 500's yesterday will be transitioning to patient's home insulin regimen with concentrated U-500 insulin 50 units at breakfast, 25 units at lunch, 50 units at supper starting tonight at supper.  Patient already received Lantus 40 units this morning. -Lantus 40 units QD -SSI ACHS -Novolog 3 units TID WC - discontinuing lantus and starting 50 units of humulin at supper and restarting home regimen for breakfast and lunch to start tomorrow -Gabapentin 900 mg QHS -Simvastatin 20 mg QD  DVT/PE ppx: IVC in place, SCDs FEN: CM CODE: FULL  Camelia Phenes, DO 08/16/2016, 12:01 PM Pager: [phone]

## 2016-08-16 NOTE — Progress Notes (Signed)
eLink Physician-Brief Progress Note Patient Name: SHADOW SCHEDLER DOB: [birth year] MRN: [id number]   Date of Service  08/16/2016  HPI/Events of Note  Unable to get IV access Discussed with PCCM team on ground who had evaluated pt.   eICU Interventions  Will try to get EJ by PICC team and avoid CVL. If unable to get access then reassess in AM.     Intervention Category Evaluation Type: Other  Kaylen Motl 08/16/2016, 8:09 PM

## 2016-08-16 NOTE — Progress Notes (Signed)
IMTS on call MD notified that patient has gone in/out of ventricular bigeminy x3 this morning. Failed attempt to capture on EKG. Patients last magnesium level on 4/24 was 2.0, K WNL. Patient asymptomatic, no pain.

## 2016-08-16 NOTE — Progress Notes (Signed)
IMTS MDs at bedside discussing POC with patient to include PCCM re-consultation regarding increased O2 needs and ABG. Patient has verbalized understanding.

## 2016-08-16 NOTE — Evaluation (Signed)
Clinical/Bedside Swallow Evaluation Patient Details  Name: Wayne Benson MRN: [id number] Date of Birth: [birth year]  Today's Date: 08/16/2016 Time: SLP Start Time (ACUTE ONLY): 1414 SLP Stop Time (ACUTE ONLY): 1429 SLP Time Calculation (min) (ACUTE ONLY): 15 min  Past Medical History:  Past Medical History:  Diagnosis Date  . Anemia   . Anxiety   . Bipolar disorder (HCC)   . Bronchial asthma    "get it just about q yr" (08/06/2016)  . Depression   . Diabetes mellitus without complication (HCC)   . DVT (deep vein thrombosis) in pregnancy (HCC)    RLE  . Esophageal varices (HCC)   . GERD (gastroesophageal reflux disease)   . History of blood transfusion    "related to cirrhosis"  . Hypercholesteremia   . Liver cirrhosis secondary to NASH (HCC)    hx/notes 08/06/2016  . Non-alcoholic cirrhosis (HCC)   . Peripheral neuropathy   . Schizophrenia (HCC)   . Thrombocytopenia (HCC)    Past Surgical History:  Past Surgical History:  Procedure Laterality Date  . CARDIAC CATHETERIZATION     "@ the [place]":  Marland Kitchen CATARACT EXTRACTION W/ INTRAOCULAR LENS IMPLANT Right   . ESOPHAGOGASTRODUODENOSCOPY (EGD) WITH PROPOFOL Left 01/21/2016   Procedure: ESOPHAGOGASTRODUODENOSCOPY (EGD) WITH PROPOFOL;  Surgeon: Willis Modena, MD;  Location: [facility] ENDOSCOPY;  Service: Endoscopy;  Laterality: Left;  . FINGER SURGERY Right 1990s?   "thumb; sliced it w/a blade"  . IVC FILTER PLACEMENT ([facility] HX)    . US GUIDED PARACENTESIS ([facility] HX)     HPI:  Pt is a 61 yo male admitted with cough, fever, and fatigue, foudn to have influenza A requiring BiPAP. CXR shows progressive infiltrates. SLP swallow evaluation ordered due to worsening O2 requirements with potential for intermittent aspiration.   Assessment / Plan / Recommendation Clinical Impression  Pt is aphonic, with new onset on previous date per his report. He describes a h/o esophageal dysphagia but without any symptoms during this hospitalization. No overt s/s  of aspiration are observed, although he cannot quite finish the full 3 ounces of water without stopping to take a breath. Recommend to proceed with instrumental testing to better assess pt's risk for silent aspiration given current respiratory status and h/o esophageal issues. Would continue with current diet using aspiration and esophageal precautions until that can be completed on next date. SLP Visit Diagnosis: Dysphagia, unspecified (R13.10)    Aspiration Risk  Mild aspiration risk    Diet Recommendation Regular;Thin liquid   Liquid Administration via: Cup;Straw Medication Administration: Whole meds with liquid Supervision: Patient able to self feed;Intermittent supervision to cue for compensatory strategies Compensations: Slow rate;Small sips/bites Postural Changes: Seated upright at 90 degrees;Remain upright for at least 30 minutes after po intake    Other  Recommendations Oral Care Recommendations: Oral care BID   Follow up Recommendations  (tba)      Frequency and Duration            Prognosis        Swallow Study   General HPI: Pt is a 61 yo male admitted with cough, fever, and fatigue, foudn to have influenza A requiring BiPAP. CXR shows progressive infiltrates. SLP swallow evaluation ordered due to worsening O2 requirements with potential for intermittent aspiration. Type of Study: Bedside Swallow Evaluation Previous Swallow Assessment: none in chart Diet Prior to this Study: Regular;Thin liquids Temperature Spikes Noted: No Respiratory Status: Nasal cannula (high flow) History of Recent Intubation: No Behavior/Cognition: Alert;Cooperative;Pleasant mood Oral Cavity Assessment: Dry  Oral Care Completed by SLP: No Oral Cavity - Dentition: Adequate natural dentition Vision: Functional for self-feeding Self-Feeding Abilities: Able to feed self Patient Positioning: Upright in bed Baseline Vocal Quality: Aphonic    Oral/Motor/Sensory Function     Ice Chips Ice chips:  Not tested   Thin Liquid Thin Liquid: Within functional limits Presentation: Cup;Self Fed;Straw    Nectar Thick Nectar Thick Liquid: Not tested   Honey Thick Honey Thick Liquid: Not tested   Puree Puree: Within functional limits Presentation: Self Fed;Spoon   Solid   GO   Solid: Within functional limits Presentation: Self Georjean Mode 08/16/2016,2:50 PM  Maxcine Ham, M.A. CCC-SLP [phone]

## 2016-08-16 NOTE — Progress Notes (Signed)
PULMONARY  / CRITICAL CARE MEDICINE  Name: Wayne Benson MRN: [id number] DOB: April 08, 1956    LOS: 10  REFERRING MD :  Earl Lagos, MD - [facility]   BRIEF PATIENT DESCRIPTION: Asked by Dr. Ladona Ridgel with [facility] to assess for dyspnea. 61 yo male smoker presented with 3 days of cough, fever 101F and fatigue.  Found to have influenza A.  Had progressive hypoxia and transitioned to Bipap.  CXR shows progressive infiltrates.  He had elevated lactic acid. He denies sinus congestion, sore throat, headache, abdominal pain, nausea, diarrhea, skin rash.  He feels his chest is tight but seems better since being started on Bipap  LINES / TUBES: PIV bil 4/17>>  CULTURES: Blood 4/17 >>NGx5d final Influenza PCR 4/17 >> Influenza A positive Pneumococcal Ag 4/18 >> negative Legionella Ag 4/18 >> negative C diff 4/19>> negative  ANTIBIOTICS: Rocephin 4/17 >> 4/17 Zithromax 4/17 >> 4/17 Tamiflu 4/17 >>4/22 Vancomycin 4/17 >>4/10 Zosyn 4/17 >>4/20 Ceftriaxone 4/20>>  SIGNIFICANT EVENTS:  4/17 Admit 4/18 To ICU for resp failure failing bipap. Echo 01/20/16 >> EF 40 to 45% CXR 4/18 - 4/22 b/l infiltrates. 4/24 moved out of ICU on 10 liters 4/25 down to 5 liters 4/26 oxygen requirements up again but CXR improved.  4/27 PCCM asked to re-eval d/t persistent elevated o2 needs  Subjective  Objective   VITAL SIGNS: Temp:  [98.1 F (36.7 C)-98.6 F (37 C)] 98.1 F (36.7 C) (04/27 1217) Pulse Rate:  [65-87] 71 (04/27 1217) Resp:  [19-33] 29 (04/27 1217) BP: (109-134)/(64-84) 117/67 (04/27 1217) SpO2:  [86 %-95 %] 93 % (04/27 1302) FiO2 (%):  [100 %] 100 % (04/27 1302) Weight:  [177 lb 4 oz (80.4 kg)] 177 lb 4 oz (80.4 kg) (04/27 0500) HEMODYNAMICS:   VENTILATOR SETTINGS: FiO2 (%):  [100 %] 100 % INTAKE / OUTPUT: Intake/Output      04/26 0701 - 04/27 0700 04/27 0701 - 04/28 0700   P.O.  360   I.V. (mL/kg) 3 (0)    Total Intake(mL/kg) 3 (0) 360 (4.5)   Urine (mL/kg/hr) 2625 (1.4) 300 (0.6)   Stool 0 (0) 0 (0)   Total Output 2625 300   Net -2622 +60        Urine Occurrence 2 x 1 x   Stool Occurrence 2 x 1 x     PHYSICAL EXAMINATION: General appearance:  61 Year old  Male, well nourished NAD, currently in acute distress, confused,  conversant  Eyes: anicteric sclerae, moist conjunctivae; PERRL, EOMI bilaterally. Mouth:  membranes and no mucosal ulcerations; normal hard and soft palate. Phonation very hoarse.  Neck: Trachea midline; neck supple, no JVD Lungs/chest: faint crackles both bases, with normal respiratory effort and no intercostal retractions CV: RRR, no MRGs  Abdomen: Soft, non-tender; no masses or HSM Extremities: No peripheral edema or extremity lymphadenopathy Skin: Normal temperature, turgor and texture; no rash, ulcers or subcutaneous nodules Psych: Appropriate affect, alert and oriented to person, place and time  LABS: Cbc  Recent Labs Lab 08/12/16 0214 08/13/16 0248 08/15/16 0844  WBC 4.7 5.1 6.3  HGB 13.5 13.2 14.4  HCT 38.5* 38.2* 41.9  PLT 42* 49* 67*    Chemistry   Recent Labs Lab 08/10/16 0244 08/11/16 0203 08/12/16 0214 08/13/16 0248  08/15/16 0844 08/15/16 1814 08/16/16 0244  NA 137 139 139 139  < > 134* 134* 135  K 3.4* 2.9* 3.6 3.5  < > 4.2 4.3 3.7  CL 101 102 105 102  < >  100* 101 101  CO2 < > BUN 23* 19 19 22*  < > CREATININE 0.64 0.60* 0.54* 0.54*  < > 0.71 0.86 0.62  CALCIUM 7.9* 8.3* 8.4* 8.4*  < > 8.4* 8.6* 8.7*  MG 2.1 1.9 2.1 2.0  --   --   --   --   PHOS 1.9* 2.1* 2.0*  --   --   --   --   --   GLUCOSE 333* 144* 197* 136*  < > 292* 451* 318*  < > = values in this interval not displayed.  Liver fxn  Recent Labs Lab 08/11/16 0203  AST 50*  ALT 25  ALKPHOS 54  BILITOT 1.2  PROT 5.2*  ALBUMIN 2.4*   coags No results for input(s): APTT, INR in the last 168 hours. Sepsis markers  Recent Labs Lab 08/10/16 0244  PROCALCITON 0.27   Cardiac markers No results for  input(s): CKTOTAL, CKMB, TROPONINI in the last 168 hours. BNP No results for input(s): PROBNP in the last 168 hours. ABG  Recent Labs Lab 08/16/16 1210  PHART 7.495*  PCO2ART 35.4  PO2ART 54.8*  HCO3 27.0    CBG trend  Recent Labs Lab 08/15/16 1218 08/15/16 1646 08/15/16 2143 08/16/16 0728 08/16/16 1216  GLUCAP 316* 506* 384* 282* 353*    DIAGNOSES: Principal Problem:   Influenza A Active Problems:   Diabetes (HCC)   Esophageal varices in cirrhosis (HCC)   Tobacco abuse   GERD (gastroesophageal reflux disease)   Liver cirrhosis secondary to NASH (HCC)   Thrombocytopenia (HCC)   Pressure injury of skin   Respiratory failure (HCC)   ASSESSMENT / PLAN:  Acute Hypoxic respiratory failure in setting of influenza A and resultant viral pneumonitis/ARDS +/- CAP/super infection (NOS) Worsening O2 requirements (4/27) ? Edema ? Aspiration ? PE??  Discussion ->he has completed his abx -aeration improved on CXR -still requiring high flow oxygen His rising O2 requirements seem out of proportion to his CXR changes which have improved. He has a IVC filter but it is at least 61 years old so it is possible he could have enough collaterals at this point where DVT and PE are on ddx. Also consider intermittent aspiration.   Plan CT angio Swallow eval  No change in pred dosing.  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 Could be flash edema but need to r/o PE first.  -he is neg 8 liters down since admit.  -wt at about baseline  Plan Cont current diuretics  IF CT negative will change lasix from oral to IV but don't think this is volume overload.  f/u new pending echo    Thrombocytopenia in setting of NASH related Cirrhosis Also has h/o  varices. Has IVC filter d/t bleeding in past.  Plan Trend CBC  h/o remote DVT w/ filter Plan Trend cbc Will need to d/w IR. Not sure if it can be removed at this point. This may limit options.    DM w/ hyperglycemia Plan ssi   Simonne Martinet ACNP-BC [facility] Pulmonary/Critical Care Pager # [phone] OR # [phone] if no answer  Attending Note:  61 year old male with ARDS after influenza A  who was intubated and extubated.  Patient has history of NASH resulting into thrombocytopenia and no chemical DVT prophylaxis was ordered.  Patient continued to be diuresed and I reviewed CXR myself, evidence of improvement in aeration.  PCCM was called back for worsening hypoxemia with a PaO2 of 55 on 15L HFNC.  On exam, lungs are relatively clear.  Discussed with PCCM-NP.  Acute hypoxemic respiratory failure: ?PE, has a filter that is >64 years old so unlikely to be effective by now (collaterals).  - ?DVT  - CTA of the lungs  - Hold off heparin for now given low platelets  ARDS:  - Keep as dry as able  Hypoxemia  - Titrate O2 for sat of 88-92%.  PCCM will f/u.  Patient seen and examined, agree with above note.  I dictated the care and orders written for this patient under my direction.  Alyson Reedy, MD [phone]

## 2016-08-16 NOTE — Progress Notes (Addendum)
Inpatient Diabetes Program Recommendations  AACE/ADA: New Consensus Statement on Inpatient Glycemic Control (2015)  Target Ranges:  Prepandial:   less than 140 mg/dL      Peak postprandial:   less than 180 mg/dL (1-2 hours)      Critically ill patients:  140 - 180 mg/dL   Review of Glycemic Control  Diabetes history: DM 2 Outpatient Diabetes medications: Humulin U-500 100-50-100 Current orders for Inpatient glycemic control: Lantus 40 units, Novolog Moderate tid, Novolog 5 tid meal coverage  Inpatient Diabetes Program Recommendations:  Spoke with MD. Due to hyperglycemia trends in the 500's yesterday will be transitioning to patient's home insulin regimen with the concentrated U-500 insulin 50 units breakfast, 25 units lunch, 50 units supper.  Thanks,  Christena Deem RN, MSN, [facility] Inpatient Diabetes Coordinator Team Pager [phone] (8a-5p)

## 2016-08-17 ENCOUNTER — Inpatient Hospital Stay (HOSPITAL_COMMUNITY): Payer: No Typology Code available for payment source

## 2016-08-17 DIAGNOSIS — D696 Thrombocytopenia, unspecified: Secondary | ICD-10-CM

## 2016-08-17 DIAGNOSIS — I509 Heart failure, unspecified: Secondary | ICD-10-CM

## 2016-08-17 DIAGNOSIS — J9601 Acute respiratory failure with hypoxia: Secondary | ICD-10-CM

## 2016-08-17 DIAGNOSIS — J849 Interstitial pulmonary disease, unspecified: Secondary | ICD-10-CM

## 2016-08-17 LAB — ECHOCARDIOGRAM COMPLETE
E decel time: 180 msec
EERAT: 8.77
FS: 24 % — AB (ref 28–44)
HEIGHTINCHES: 70 in
IVS/LV PW RATIO, ED: 1.01
LA ID, A-P, ES: 46 mm
LA diam end sys: 46 mm
LA vol A4C: 66.1 ml
LA vol index: 32.5 mL/m2
LADIAMINDEX: 2.3 cm/m2
LAVOL: 65.1 mL
LDCA: 2.84 cm2
LV E/e'average: 8.77
LV TDI E'LATERAL: 10.4
LV TDI E'MEDIAL: 6.7
LVEEMED: 8.77
LVELAT: 10.4 cm/s
LVOT SV: 60 mL
LVOT VTI: 21 cm
LVOT diameter: 19 mm
LVOTPV: 110 cm/s
MV Dec: 180
MV pk E vel: 91.2 m/s
MVPG: 3 mmHg
MVPKAVEL: 68.4 m/s
PW: 9.11 mm — AB (ref 0.6–1.1)
WEIGHTICAEL: 2836 [oz_av]

## 2016-08-17 LAB — CBC
HCT: 39.7 % (ref 39.0–52.0)
Hemoglobin: 13.5 g/dL (ref 13.0–17.0)
MCH: 30.7 pg (ref 26.0–34.0)
MCHC: 34 g/dL (ref 30.0–36.0)
MCV: 90.2 fL (ref 78.0–100.0)
PLATELETS: 71 10*3/uL — AB (ref 150–400)
RBC: 4.4 MIL/uL (ref 4.22–5.81)
RDW: 13.8 % (ref 11.5–15.5)
WBC: 9.9 10*3/uL (ref 4.0–10.5)

## 2016-08-17 LAB — BASIC METABOLIC PANEL
Anion gap: 7 (ref 5–15)
BUN: 13 mg/dL (ref 6–20)
CALCIUM: 8.8 mg/dL — AB (ref 8.9–10.3)
CO2: 30 mmol/L (ref 22–32)
CREATININE: 0.61 mg/dL (ref 0.61–1.24)
Chloride: 101 mmol/L (ref 101–111)
Glucose, Bld: 134 mg/dL — ABNORMAL HIGH (ref 65–99)
Potassium: 3.5 mmol/L (ref 3.5–5.1)
Sodium: 138 mmol/L (ref 135–145)

## 2016-08-17 LAB — GLUCOSE, CAPILLARY
GLUCOSE-CAPILLARY: 288 mg/dL — AB (ref 65–99)
GLUCOSE-CAPILLARY: 377 mg/dL — AB (ref 65–99)
GLUCOSE-CAPILLARY: 258 mg/dL — AB (ref 65–99)
Glucose-Capillary: 142 mg/dL — ABNORMAL HIGH (ref 65–99)

## 2016-08-17 LAB — MAGNESIUM: Magnesium: 1.7 mg/dL (ref 1.7–2.4)

## 2016-08-17 LAB — SEDIMENTATION RATE: SED RATE: 31 mm/h — AB (ref 0–16)

## 2016-08-17 LAB — PHOSPHORUS: Phosphorus: 2.9 mg/dL (ref 2.5–4.6)

## 2016-08-17 LAB — C-REACTIVE PROTEIN: CRP: 8.7 mg/dL — AB (ref <1.0)

## 2016-08-17 MED ORDER — METHYLPREDNISOLONE SODIUM SUCC 40 MG IJ SOLR
40.0000 mg | Freq: Two times a day (BID) | INTRAMUSCULAR | Status: DC
  Administered 2016-08-17: 40 mg via INTRAVENOUS
  Filled 2016-08-17: qty 1

## 2016-08-17 MED ORDER — IOPAMIDOL (ISOVUE-370) INJECTION 76%
INTRAVENOUS | Status: AC
  Administered 2016-08-17: 100 mL
  Filled 2016-08-17: qty 100

## 2016-08-17 MED ORDER — FUROSEMIDE 10 MG/ML IJ SOLN
40.0000 mg | Freq: Two times a day (BID) | INTRAMUSCULAR | Status: DC
  Administered 2016-08-17 – 2016-08-18 (×2): 40 mg via INTRAVENOUS
  Filled 2016-08-17 (×2): qty 4

## 2016-08-17 NOTE — Progress Notes (Signed)
Patient has taken oxygen off several times this evening and quickly desats in 70's.Upon replacing oxygen saturations gradually increase 95-98%.Educated patient to leave oxygen on at this .Patient verbalized understanding.

## 2016-08-17 NOTE — Progress Notes (Addendum)
Informed patient having increased PVCs. Hemodynamis are stable and he is chest pain free. BMP ordered is largerly unremarkable. Will order Mag and Phos levels as they have not been drawn in several days. Per PCCM patient is to have a CTA to evaluate for PE. IV access was lost earlier but a new IV was able to be placed. He will go for CTA chest to evaluate for PE. Will continue to monitor.

## 2016-08-17 NOTE — Plan of Care (Signed)
Problem: Education: Goal: Ability to describe self-care measures that may prevent or decrease complications (Diabetes Survival Skills Education) will improve Outcome: Progressing Discussed with the patient how steroids can make his blood glucose elevated and hungry and the fact we have to watch his intake with teach back displayed.

## 2016-08-17 NOTE — Progress Notes (Signed)
[facility] Attending Note  Presenting HPI:  61 y.o. male with known history of tobacco use presenting with dyspnea, fever, and fatigue. Subsequently found to be infected with influenza A virus. Patient required noninvasive positive pressure ventilation for respiratory support.  Subjective:  Patient denies any chest pain or pressure. Continues to have dyspnea and intermittent cough.  Review of Systems:  Denies subjective fever or chills. No abdominal pain or nausea. No headache.  Temp:  [98.1 F (36.7 C)-98.8 F (37.1 C)] 98.3 F (36.8 C) (04/28 0327) Pulse Rate:  [71-100] 83 (04/28 0600) Resp:  [13-29] 26 (04/28 0600) BP: (107-183)/(63-109) 119/81 (04/28 0600) SpO2:  [83 %-100 %] 100 % (04/28 0829) FiO2 (%):  [80 %-100 %] 80 % (04/28 0829)  General:  Awake. No distress. Alert. Integument:  Warm & dry. No rash or bruising on exposed skin. HEENT:  No scleral icterus or injection. Pupils symmetric.  Pulmonary:  Coarse bilateral breath sounds. Normal work of breathing on high flow nasal cannula oxygen.  Cardiovascular:  Regular rate & rhythm. No JVD apprecaited. No edema. Abdomen:  Soft. Nontender. Normal bowel sounds. Neurological:  Cranial nerves grossly in tact. No meningismus. Moving all 4 extremities equally. Oriented x4.   CBC Latest Ref Rng & Units 08/17/2016 08/15/2016 08/13/2016  WBC 4.0 - 10.5 K/uL 9.9 6.3 5.1  Hemoglobin 13.0 - 17.0 g/dL 04.5 40.9 81.1  Hematocrit 39.0 - 52.0 % 39.7 41.9 38.2(L)  Platelets 150 - 400 K/uL 71(L) 67(L) 49(L)   BMP Latest Ref Rng & Units 08/17/2016 08/16/2016 08/15/2016  Glucose 65 - 99 mg/dL 914(N) 829(F) 621(H)  BUN 6 - 20 mg/dL Creatinine 0.61 - 1.24 mg/dL 0.86 5.78 4.69  Sodium 135 - 145 mmol/L 138 135 134(L)  Potassium 3.5 - 5.1 mmol/L 3.5 3.7 4.3  Chloride 101 - 111 mmol/L 101 101 101  CO2 22 - 32 mmol/L Calcium 8.9 - 10.3 mg/dL 6.2(X) 5.2(W) 4.1(L)    IMAGING/STUDIES: CTA CHEST 4/28:   Personally reviewed by me. No pulmonary embolism identified. Patient has some pleural intralobular septal thickening and opacification with peripheral sparing with a slight craniocaudal progression. No obvious honeycombing although radiology commented upon this. No evidence of overt bronchiectasis. Splenomegaly is present. No pathologic mediastinal or hilar adenopathy. No pericardial effusion. Possible trace bilateral pleural effusions. No pleural thickening appreciated.  MICROBIOLOGY: MRSA PCR 4/17:  Negative Blood Cultures x2 4/17:  Negative HIV 4/17:  Nonreactive  Influenza A/B PCR 4/17:  Positive for Influenza A Urine Streptococcal Antigen 4/18:  Negative  Urine Legionella Antigen 4/18:  Negative   ANTIBIOTICS: Zosyn 4/18 - 4/20 Vancomycin 4/18 - 4/20 Rocephin 4/20 - 4/24 Tamiflu 4/17 - 4/22  ASSESSMENT/PLAN:  61 y.o. male with acute hypoxic respiratory failure in the setting of influenza A viral infection with possible bacterial superinfection. Patient's CT angiogram indicates possible underlying interstitial lung disease; however, in the setting of acute infection these findings could be due to the viral process itself. There is no evidence of for pulmonary embolism which is reassuring. Despite continued aggressive diuresis the patient's renal function remains stable without signs of hypovolemia leading me to believe that there is still some component of pulmonary edema from his systolic congestive heart failure.  1. Acute hypoxic respiratory failure: Recommend continuing to wean FiO2 supplementation. Recommend continuing aggressive diuresis as renal function and blood pressure allow. Holding off on steroid therapy at this time. 2. Possible underlying ILD: Favors NSIP on CT  angiogram. Checking limited autoimmune workup and inflammatory markers. After patient recovers he will need a high-resolution CT chest without contrast to better characterize. 3. Influenza A viral infection: Status post  treatment.  I have spent a total of 37 minutes of time today caring for the patient, reviewing the patient's electronic medical record, and with more than 50% of that time spent coordinating care with the patient as well as reviewing the continuing plan of care with the patient at bedside.  Remainder of care as per primary service. We will reassess the patient on 4/30. Please contact me if there are any further questions or concerns before then.   Donna Christen Jamison Neighbor, M.D. [facility] Pulmonary & Critical Care Pager:  [phone] After 3pm or if no response, call [phone] 10:31 AM 08/17/16

## 2016-08-17 NOTE — Progress Notes (Signed)
  Echocardiogram 2D Echocardiogram has been performed.  Delcie Roch 08/17/2016, 11:39 AM

## 2016-08-17 NOTE — Progress Notes (Addendum)
Patient having ectopy on telemetry monitor increase frequency of pvc's and runs of bigeminy pvc's returning to sinus rhythm.Patient denies chest pain or shortness of breath at present time.Patient remains on high flow oxygen 100%.Dr.Taylor notified will continue to monitor.

## 2016-08-17 NOTE — Progress Notes (Signed)
   Subjective:  Patient continues to require HFNC and desats quickly off of O2. He is noted to have asymptomatic PVCs and bigeminy on telemetry overnight. Patient states that he has not felt short of breath overnight while on oxygen. He reports a chronic cough productive of yellowish sputum. He says he is going to give up smoking. He reports exposure to silicon powder 35-40 years ago.   Objective:  Vital signs in last 24 hours: Vitals:   08/17/16 0400 08/17/16 0500 08/17/16 0600 08/17/16 0829  BP: 132/81 123/85 119/81   Pulse: 94 94 83   Resp: (!) 25 15 (!) 26   Temp:      TempSrc:      SpO2: 95% 92% 99% 100%  Weight:      Height:       General: resting in bed, heated HFNC in place HEENT: hoarse voice Cardiac: RRR, no rubs, murmurs or gallops Pulm: faint bibasilar crackles, moving normal volumes of air, no respiratory distress Abd: soft, nontender, nondistended, BS present Ext: warm and well perfused, no pedal edema Neuro: alert and oriented X3   Assessment/Plan:  Principal Problem:   Influenza A Active Problems:   Diabetes (HCC)   Esophageal varices in cirrhosis (HCC)   Tobacco abuse   GERD (gastroesophageal reflux disease)   Liver cirrhosis secondary to NASH (HCC)   Thrombocytopenia (HCC)   Pressure injury of skin   Respiratory failure (HCC)   ARDS (adult respiratory distress syndrome) (HCC)   Hypoxemia  Acute Hypoxic Respiratory Failure: Patient continues to require HFNC, has been difficult to wean O2 down without hypoxia. CTA obtained yesterday did not show obvious evidence of pulmonary embolus, however there are peripheral honeycombing changes suggestive of ILD, particularly UIP. He does report a remote history of exposure to silicon powder. No prior chest CT scans on file or in care everywhere. Given these findings, I would favor restarting Solu-medrol and monitoring for improvement with eventual transition to oral Prednisone for 4 weeks with subsequent slow taper.  Can also consider high resolution CT chest for further characterization. Further pulmonary recommendations are appreciated. - Start Solu-medrol IV 40 mg BID - Tussionex Q12H prn cough - Duonebs TID  - Mucinex BID - Albuterol Q2H prn  - Appreciate PCCMs recommendations - Continue PT  Chronic HFrEF: EF 40-45% 12/2015.  Does not appear to be volume overloaded on exam. He continues to diurese well with stable creatinine.  - Continue Lasix  po BID - Daily weights - Strict I/Os - Repeat Echo - Monitor BMET  Cirrhosis 2/2 NASH with Esophageal Varices and Thrombocytopenia: No signs of bleeding or altered mental status.  - Nadolol 20 mg QD - Protonix  QD  T2DM: A1c 12.0 on 08/06/2016. Patient is on Humulin 50-100 units TID at home. Patient is restarted on half dose of home insulin regimen. - Humulin R 50 U am with breakfast, 25 U with lunch, 50 U with supper - Gabapentin 900 mg QHS  DVT/PE ppx: IVC in place, SCDs  Dispo: Anticipated discharge pending clinical course.   Darreld Mclean, MD 08/17/2016, 10:25 AM

## 2016-08-17 NOTE — Progress Notes (Signed)
Spoke to patient's sister Lamar Laundry to update per patient's request.

## 2016-08-18 LAB — BASIC METABOLIC PANEL
Anion gap: 9 (ref 5–15)
BUN: 20 mg/dL (ref 6–20)
CALCIUM: 8.9 mg/dL (ref 8.9–10.3)
CHLORIDE: 97 mmol/L — AB (ref 101–111)
CO2: 31 mmol/L (ref 22–32)
CREATININE: 0.66 mg/dL (ref 0.61–1.24)
GFR calc non Af Amer: >60 mL/min (ref >60)
Glucose, Bld: 210 mg/dL — ABNORMAL HIGH (ref 65–99)
Potassium: 4.1 mmol/L (ref 3.5–5.1)
Sodium: 137 mmol/L (ref 135–145)

## 2016-08-18 LAB — GLUCOSE, CAPILLARY
GLUCOSE-CAPILLARY: 218 mg/dL — AB (ref 65–99)
GLUCOSE-CAPILLARY: 309 mg/dL — AB (ref 65–99)
Glucose-Capillary: 321 mg/dL — ABNORMAL HIGH (ref 65–99)
Glucose-Capillary: 294 mg/dL — ABNORMAL HIGH (ref 65–99)

## 2016-08-18 LAB — CBC
HCT: 39.5 % (ref 39.0–52.0)
Hemoglobin: 13.5 g/dL (ref 13.0–17.0)
MCH: 31 pg (ref 26.0–34.0)
MCHC: 34.2 g/dL (ref 30.0–36.0)
MCV: 90.8 fL (ref 78.0–100.0)
PLATELETS: 69 10*3/uL — AB (ref 150–400)
RBC: 4.35 MIL/uL (ref 4.22–5.81)
RDW: 13.9 % (ref 11.5–15.5)
WBC: 9.7 10*3/uL (ref 4.0–10.5)

## 2016-08-18 MED ORDER — INSULIN ASPART 100 UNIT/ML ~~LOC~~ SOLN
0.0000 [IU] | Freq: Every day | SUBCUTANEOUS | Status: DC
  Administered 2016-08-18: 3 [IU] via SUBCUTANEOUS

## 2016-08-18 MED ORDER — IPRATROPIUM-ALBUTEROL 0.5-2.5 (3) MG/3ML IN SOLN
3.0000 mL | Freq: Two times a day (BID) | RESPIRATORY_TRACT | Status: DC
  Administered 2016-08-18 – 2016-08-21 (×6): 3 mL via RESPIRATORY_TRACT
  Filled 2016-08-18 (×6): qty 3

## 2016-08-18 MED ORDER — ACETAMINOPHEN 325 MG PO TABS
650.0000 mg | ORAL_TABLET | Freq: Four times a day (QID) | ORAL | Status: DC | PRN
  Administered 2016-08-18: 650 mg via ORAL
  Filled 2016-08-18 (×2): qty 2

## 2016-08-18 MED ORDER — ALBUTEROL SULFATE (2.5 MG/3ML) 0.083% IN NEBU: 2.5000 mg | INHALATION_SOLUTION | RESPIRATORY_TRACT | Status: DC | PRN

## 2016-08-18 MED ORDER — FUROSEMIDE 40 MG PO TABS
40.0000 mg | ORAL_TABLET | Freq: Two times a day (BID) | ORAL | Status: DC
  Administered 2016-08-18 – 2016-08-20 (×4): 40 mg via ORAL
  Filled 2016-08-18 (×4): qty 1

## 2016-08-18 MED ORDER — METHYLPREDNISOLONE SODIUM SUCC 40 MG IJ SOLR
40.0000 mg | Freq: Two times a day (BID) | INTRAMUSCULAR | Status: DC
  Administered 2016-08-18 – 2016-08-19 (×4): 40 mg via INTRAVENOUS
  Filled 2016-08-18 (×4): qty 1

## 2016-08-18 MED ORDER — PHENOL 1.4 % MT LIQD
1.0000 | OROMUCOSAL | Status: DC | PRN
  Administered 2016-08-18 (×2): 1 via OROMUCOSAL
  Filled 2016-08-18: qty 177

## 2016-08-18 MED ORDER — INSULIN ASPART 100 UNIT/ML ~~LOC~~ SOLN
0.0000 [IU] | Freq: Three times a day (TID) | SUBCUTANEOUS | Status: DC
  Administered 2016-08-18 (×2): 15 [IU] via SUBCUTANEOUS
  Administered 2016-08-19: 20 [IU] via SUBCUTANEOUS
  Administered 2016-08-19 (×2): 11 [IU] via SUBCUTANEOUS
  Administered 2016-08-20: 4 [IU] via SUBCUTANEOUS
  Administered 2016-08-20: 7 [IU] via SUBCUTANEOUS
  Administered 2016-08-20: 15 [IU] via SUBCUTANEOUS
  Administered 2016-08-21: 11 [IU] via SUBCUTANEOUS
  Administered 2016-08-21: 7 [IU] via SUBCUTANEOUS

## 2016-08-18 MED ORDER — MENTHOL 3 MG MT LOZG: 1.0000 | LOZENGE | OROMUCOSAL | Status: DC | PRN

## 2016-08-18 NOTE — Plan of Care (Signed)
Problem: Fluid Volume: Goal: Maintenance of adequate hydration will improve Outcome: Progressing Discussed with patient importance of drinking and eating low sodium products with some teach back displayed.

## 2016-08-18 NOTE — Progress Notes (Signed)
Patient upset c/o not being able to find his wallet/clothing. This nurse asked did his wife have those items. Patient stated "no". This nurse attempted to find the items from the unit he had transferred from. Asked patient to call his wife to see if she had those above items. Patient's wife states she does have the wallet/clothing. Patient is calm after knowing his wallet was found.

## 2016-08-18 NOTE — Progress Notes (Signed)
SLP Cancellation Note  Patient DetaiEMMANUEL GRUENHAGENuke L Overbay MRN: [id number] DOB: 11-Jul-1955   Cancelled treatment:       Reason Eval/Treat Not Completed: Medical issues which prohibited therapy. Per discussion with RN, pt unable to be transitioned from HFNC in order to go to radiology for MBS. Pt may benefit from FEES which can be done in room to assess for silent aspiration. Can be attempted on next date. Recommend continuing current diet with aspiration and esophageal precautions until that can be completed.   Maxcine Ham 08/18/2016, 8:08 AM  Maxcine Ham, M.A. CCC-SLP [phone]

## 2016-08-18 NOTE — Progress Notes (Signed)
   Subjective: Patient was evaluated this morning.  Patient states that his breathing has been stable overnight and this morning. He states that he does have difficulty breathing when he tries to ambulate.  He denies chest pain.    Objective:  Vital signs in last 24 hours: Vitals:   08/18/16 0400 08/18/16 0500 08/18/16 0600 08/18/16 0700  BP: (!) 146/78 (!) 120/58 (!) 120/57 110/62  Pulse: 72 67 74 72  Resp: 17 (!) 23 (!) 21 (!) 27  Temp:      TempSrc:      SpO2: 95% 90% 92% (!) 84%  Weight:   174 lb 13.2 oz (79.3 kg)   Height:       Physical Exam  Constitutional: He is well-developed, well-nourished, and in no distress.  Cardiovascular: Normal rate, regular rhythm and normal heart sounds.  Exam reveals no gallop and no friction rub.   No murmur heard. Pulmonary/Chest: Effort normal. No respiratory distress.  Faint bibasilar crackles     Assessment/Plan:  Principal Problem:   Influenza A  Acute Hypoxic Respiratory Failure: Patient continues to require HFNC and has been difficult to wean O2 down without hypoxia. Echo obtained yesterday shows EF 50-55% and IVC consistent with normal central venous pressure.  Patient was restarted on IV lasix from oral yesterday.  Patient has peripheral honeycombing on CTA suggestive of ILD.  Will re-start solumedrol with eventual transition to oral Prednisone with slow taper.  Can also consider high resolution CT chest for further characterization when patient improves clinically. Further pulmonary recommendations are appreciated. - Solu-medrol IV 40 mg BID - Tussionex Q12H prn cough - Duonebs TID  - Mucinex BID - Albuterol Q2H prn  - Appreciate PCCMs recommendations - Continue PT  Chronic HFrEF: Echo was obtained yesterday and EF 50-55%.  Previously EF 40-45% 12/2015. Does not appear to be volume overloaded on exam. He continues to diurese well with stable creatinine.  - Continue Lasix  IV BID - Daily weights - Strict I/Os - Repeat  Echo - Monitor BMET  Cirrhosis 2/2 NASH with Esophageal Varices and Thrombocytopenia: No signs of bleeding or altered mental status.  - Nadolol 20 mg QD - Protonix  QD  T2DM: A1c 12.0 on 08/06/2016. Patient is on Humulin 50-100 units TID at home. Patient has been restarted on half dose of home insulin regimen.  CBGs continue to be labile.  Will add resistant SSI for AC&HS coverage  - Humulin R 50 U am with breakfast, 25 U with lunch, 50 U with supper - Gabapentin 900 mg QHS - SSI  DVT/PE ppx: IVC in place, SCDs  Dispo: Anticipated discharge pending clinical course.    Camelia Phenes, DO 08/18/2016, 7:13 AM Pager: [phone]

## 2016-08-19 ENCOUNTER — Inpatient Hospital Stay (HOSPITAL_COMMUNITY): Payer: No Typology Code available for payment source

## 2016-08-19 DIAGNOSIS — Z9981 Dependence on supplemental oxygen: Secondary | ICD-10-CM

## 2016-08-19 DIAGNOSIS — K7469 Other cirrhosis of liver: Secondary | ICD-10-CM

## 2016-08-19 DIAGNOSIS — J9601 Acute respiratory failure with hypoxia: Secondary | ICD-10-CM

## 2016-08-19 LAB — BASIC METABOLIC PANEL
ANION GAP: 8 (ref 5–15)
BUN: 21 mg/dL — ABNORMAL HIGH (ref 6–20)
CALCIUM: 8.8 mg/dL — AB (ref 8.9–10.3)
CO2: 29 mmol/L (ref 22–32)
Chloride: 101 mmol/L (ref 101–111)
Creatinine, Ser: 0.58 mg/dL — ABNORMAL LOW (ref 0.61–1.24)
GLUCOSE: 239 mg/dL — AB (ref 65–99)
POTASSIUM: 4 mmol/L (ref 3.5–5.1)
Sodium: 138 mmol/L (ref 135–145)

## 2016-08-19 LAB — CBC
HEMATOCRIT: 40.3 % (ref 39.0–52.0)
Hemoglobin: 13.8 g/dL (ref 13.0–17.0)
MCH: 31.4 pg (ref 26.0–34.0)
MCHC: 34.2 g/dL (ref 30.0–36.0)
MCV: 91.6 fL (ref 78.0–100.0)
PLATELETS: 66 10*3/uL — AB (ref 150–400)
RBC: 4.4 MIL/uL (ref 4.22–5.81)
RDW: 14.1 % (ref 11.5–15.5)
WBC: 11.2 10*3/uL — AB (ref 4.0–10.5)

## 2016-08-19 LAB — SJOGRENS SYNDROME-A EXTRACTABLE NUCLEAR ANTIBODY

## 2016-08-19 LAB — SJOGRENS SYNDROME-B EXTRACTABLE NUCLEAR ANTIBODY: SSB (La) (ENA) Antibody, IgG: <0.2 AI (ref 0.0–0.9)

## 2016-08-19 LAB — GLUCOSE, CAPILLARY
GLUCOSE-CAPILLARY: 387 mg/dL — AB (ref 65–99)
Glucose-Capillary: 282 mg/dL — ABNORMAL HIGH (ref 65–99)
Glucose-Capillary: 295 mg/dL — ABNORMAL HIGH (ref 65–99)
Glucose-Capillary: 113 mg/dL — ABNORMAL HIGH (ref 65–99)

## 2016-08-19 LAB — ANTI-SCLERODERMA ANTIBODY: Scleroderma (Scl-70) (ENA) Antibody, IgG: 0.2 AI (ref 0.0–0.9)

## 2016-08-19 LAB — ANTI-JO 1 ANTIBODY, IGG: Anti JO-1: <0.2 AI (ref 0.0–0.9)

## 2016-08-19 LAB — ANTINUCLEAR ANTIBODIES, IFA: ANTINUCLEAR ANTIBODIES, IFA: NEGATIVE

## 2016-08-19 LAB — CYCLIC CITRUL PEPTIDE ANTIBODY, IGG/IGA: CCP ANTIBODIES IGG/IGA: 3 U (ref 0–19)

## 2016-08-19 MED ORDER — INSULIN REGULAR HUMAN (CONC) 500 UNIT/ML ~~LOC~~ SOPN
60.0000 [IU] | PEN_INJECTOR | Freq: Every day | SUBCUTANEOUS | Status: DC
  Administered 2016-08-20 – 2016-08-21 (×2): 60 [IU] via SUBCUTANEOUS
  Filled 2016-08-19: qty 3

## 2016-08-19 MED ORDER — INSULIN REGULAR HUMAN (CONC) 500 UNIT/ML ~~LOC~~ SOPN
30.0000 [IU] | PEN_INJECTOR | Freq: Every day | SUBCUTANEOUS | Status: DC
  Administered 2016-08-20 – 2016-08-21 (×2): 30 [IU] via SUBCUTANEOUS

## 2016-08-19 MED ORDER — INSULIN REGULAR HUMAN (CONC) 500 UNIT/ML ~~LOC~~ SOPN
60.0000 [IU] | PEN_INJECTOR | Freq: Every day | SUBCUTANEOUS | Status: DC
  Administered 2016-08-19 – 2016-08-21 (×3): 60 [IU] via SUBCUTANEOUS
  Filled 2016-08-19: qty 3

## 2016-08-19 NOTE — Procedures (Signed)
Objective Swallowing Evaluation: Type of Study: FEES-Fiberoptic Endoscopic Evaluation of Swallow  Patient Details  Name: Wayne Benson MRN: [id number] Date of Birth: 10-02-1955  Today's Date: 08/19/2016 Time: SLP Start Time (ACUTE ONLY): 1030-SLP Stop Time (ACUTE ONLY): 1040 SLP Time Calculation (min) (ACUTE ONLY): 10 min  Past Medical History:  Past Medical History:  Diagnosis Date  . Anemia   . Anxiety   . Bipolar disorder (HCC)   . Bronchial asthma    "get it just about q yr" (08/06/2016)  . Depression   . Diabetes mellitus without complication (HCC)   . DVT (deep vein thrombosis) in pregnancy (HCC)    RLE  . Esophageal varices (HCC)   . GERD (gastroesophageal reflux disease)   . History of blood transfusion    "related to cirrhosis"  . Hypercholesteremia   . Liver cirrhosis secondary to NASH (HCC)    hx/notes 08/06/2016  . Non-alcoholic cirrhosis (HCC)   . Peripheral neuropathy   . Schizophrenia (HCC)   . Thrombocytopenia (HCC)    Past Surgical History:  Past Surgical History:  Procedure Laterality Date  . CARDIAC CATHETERIZATION     "@ the [place]":  Marland Kitchen CATARACT EXTRACTION W/ INTRAOCULAR LENS IMPLANT Right   . ESOPHAGOGASTRODUODENOSCOPY (EGD) WITH PROPOFOL Left 01/21/2016   Procedure: ESOPHAGOGASTRODUODENOSCOPY (EGD) WITH PROPOFOL;  Surgeon: Willis Modena, MD;  Location: [facility] ENDOSCOPY;  Service: Endoscopy;  Laterality: Left;  . FINGER SURGERY Right 1990s?   "thumb; sliced it w/a blade"  . IVC FILTER PLACEMENT ([facility] HX)    . US GUIDED PARACENTESIS ([facility] HX)     HPI: Pt is a 61 yo male admitted with cough, fever, and fatigue, foudn to have influenza A requiring BiPAP. CXR shows progressive infiltrates. SLP swallow evaluation ordered due to worsening O2 requirements with potential for intermittent aspiration.  Subjective: pt reports having swallowing problems due to esophageal issues that have not been bothering him lately   Assessment / Plan / Recommendation  CHL IP  CLINICAL IMPRESSIONS 08/19/2016  Clinical Impression Pt demonstrates normal swallow function. No aspiration or dysphagia seen on FEES. Pt now only mildly dysphonic, VF appeared WNL. Pt may continue a regular diet and thin liquids.   SLP Visit Diagnosis Dysphagia, unspecified (R13.10)  Attention and concentration deficit following --  Frontal lobe and executive function deficit following --  Impact on safety and function Mild aspiration risk      CHL IP TREATMENT RECOMMENDATION 08/19/2016  Treatment Recommendations No treatment recommended at this time     No flowsheet data found.  CHL IP DIET RECOMMENDATION 08/19/2016  SLP Diet Recommendations Regular solids;Thin liquid  Liquid Administration via Cup;Straw  Medication Administration Whole meds with liquid  Compensations --  Postural Changes --      CHL IP OTHER RECOMMENDATIONS 08/19/2016  Recommended Consults --  Oral Care Recommendations Oral care BID  Other Recommendations --      CHL IP FOLLOW UP RECOMMENDATIONS 08/19/2016  Follow up Recommendations None      No flowsheet data found.         CHL IP ORAL PHASE 08/19/2016  Oral Phase WFL  Oral - Pudding Teaspoon --  Oral - Pudding Cup --  Oral - Honey Teaspoon --  Oral - Honey Cup --  Oral - Nectar Teaspoon --  Oral - Nectar Cup --  Oral - Nectar Straw --  Oral - Thin Teaspoon --  Oral - Thin Cup --  Oral - Thin Straw --  Oral - Puree --  Oral - Mech Soft --  Oral - Regular --  Oral - Multi-Consistency --  Oral - Pill --  Oral Phase - Comment --    CHL IP PHARYNGEAL PHASE 08/19/2016  Pharyngeal Phase WFL  Pharyngeal- Pudding Teaspoon --  Pharyngeal --  Pharyngeal- Pudding Cup --  Pharyngeal --  Pharyngeal- Honey Teaspoon --  Pharyngeal --  Pharyngeal- Honey Cup --  Pharyngeal --  Pharyngeal- Nectar Teaspoon --  Pharyngeal --  Pharyngeal- Nectar Cup --  Pharyngeal --  Pharyngeal- Nectar Straw --  Pharyngeal --  Pharyngeal- Thin Teaspoon --  Pharyngeal  --  Pharyngeal- Thin Cup --  Pharyngeal --  Pharyngeal- Thin Straw --  Pharyngeal --  Pharyngeal- Puree --  Pharyngeal --  Pharyngeal- Mechanical Soft --  Pharyngeal --  Pharyngeal- Regular --  Pharyngeal --  Pharyngeal- Multi-consistency --  Pharyngeal --  Pharyngeal- Pill --  Pharyngeal --  Pharyngeal Comment --     No flowsheet data found.  No flowsheet data found.  Wayne Benson, [name] 08/19/2016, 10:54 AM

## 2016-08-19 NOTE — Progress Notes (Signed)
Results for LOYED, WILMES (MRN [id number]) as of 08/19/2016 13:50  Ref. Range 08/18/2016 12:28 08/18/2016 17:16 08/18/2016 22:11 08/19/2016 08:17 08/19/2016 12:24  Glucose-Capillary Latest Ref Range: 65 - 99 mg/dL 782 (H) 956 (H) 213 (H) 282 (H) 387 (H)  Noted that blood sugars continue to be greater than 200 mg/dl.  Recommend increasing U-500 insulin dosages to 60 units at breakfast, 35 units at lunch, and 60 units at supper. May need to titrate dosages down when patient goes off the steroids. Will continue to monitor blood sugars while in the hospital.   Smith Mince RN BSN CDE Diabetes Coordinator Pager: [phone]  8am-5pm

## 2016-08-19 NOTE — Plan of Care (Signed)
Problem: Activity: Goal: Risk for activity intolerance will decrease Outcome: Progressing Patient ambulated in room without difficulty.

## 2016-08-19 NOTE — Progress Notes (Signed)
Physical Therapy Treatment Patient Details Name: Wayne Benson MRN: [id number] DOB: 02-06-1956 Today's Date: 08/19/2016    History of Present Illness 61 yo admitted with respiratory failure due to flu A and PNA s/p bipap. PMHx: DM, cirrhosis, thrombocytopenia    PT Comments    Pt is progressing well with mobility. Gait distance limited today by desat. Pt reporting no dizziness or difficulty breathing. He was able to speak in full sentences throughout session with no SOB noted. Just prior to Rx, pt underwent FEES and then had a lengthy BM. This could have contributed to his low O2 sats and decreased activity tolerance. He quickly recovered to 92% on 5 L O2 upon sitting in recliner.   Follow Up Recommendations  Home health PT;Supervision/Assistance - 24 hour     Equipment Recommendations  None recommended by PT    Recommendations for Other Services       Precautions / Restrictions Precautions Precautions: Fall;Other (comment) Precaution Comments: Watch O2 sats    Mobility  Bed Mobility         Supine to sit: Supervision;HOB elevated     General bed mobility comments: increased time, +rail  Transfers   Equipment used: Ambulation equipment used Transfers: Stand Pivot Transfers Sit to Stand: Min guard Stand pivot transfers: Min guard       General transfer comment: Verbal cues for hand placement and sequencing  Ambulation/Gait Ambulation/Gait assistance: Min guard Ambulation Distance (Feet): 150 Feet Assistive device: Rolling walker (2 wheeled) Gait Pattern/deviations: Step-through pattern;Decreased step length - right Gait velocity: decreased Gait velocity interpretation: Below normal speed for age/gender General Gait Details: SpO2 90% at rest on 5 L O2. Pt transfered to [facility] prior to amb with desat to 88%. Started amb on 6 L with eventual increase to 10 L. Sats ranged 83-87% during amb. Increased to 92% at rest in recliner.   Stairs            Wheelchair  Mobility    Modified Rankin (Stroke Patients Only)       Balance   Sitting-balance support: No upper extremity supported;Feet supported Sitting balance-Leahy Scale: Good     Standing balance support: No upper extremity supported;During functional activity Standing balance-Leahy Scale: Fair                              Cognition Arousal/Alertness: Awake/alert Behavior During Therapy: WFL for tasks assessed/performed Overall Cognitive Status: Within Functional Limits for tasks assessed                                        Exercises      General Comments        Pertinent Vitals/Pain Pain Assessment: No/denies pain    Home Living                      Prior Function            PT Goals (current goals can now be found in the care plan section) Acute Rehab PT Goals Patient Stated Goal: return home and to fishing PT Goal Formulation: With patient Time For Goal Achievement: 08/27/16 Potential to Achieve Goals: Good Progress towards PT goals: Progressing toward goals    Frequency    Min 3X/week      PT Plan Current plan remains appropriate    Co-evaluation  AM-PAC PT "6 Clicks" Daily Activity  Outcome Measure  Difficulty turning over in bed (including adjusting bedclothes, sheets and blankets)?: None Difficulty moving from lying on back to sitting on the side of the bed? : A Little Difficulty sitting down on and standing up from a chair with arms (e.g., wheelchair, bedside commode, etc,.)?: A Little Help needed moving to and from a bed to chair (including a wheelchair)?: A Little Help needed walking in hospital room?: A Little Help needed climbing 3-5 steps with a railing? : A Lot 6 Click Score: 18    End of Session Equipment Utilized During Treatment: Gait belt;Oxygen Activity Tolerance: Patient tolerated treatment well Patient left: in chair;with call bell/phone within reach Nurse  Communication: Mobility status PT Visit Diagnosis: Unsteadiness on feet (R26.81);Muscle weakness (generalized) (M62.81)     Time: 1109-1140 PT Time Calculation (min) (ACUTE ONLY): 31 min  Charges:  $Gait Training: 23-37 mins                    G Codes:       Aida Raider, PT  Office # [phone] Pager [phone]    Ilda Foil 08/19/2016, 11:50 AM

## 2016-08-19 NOTE — Progress Notes (Signed)
PT Cancellation Note  Patient Details Name: Wayne Benson MRN: [id number] DOB: 01-04-1956   Cancelled Treatment:    Reason Eval/Treat Not Completed: Patient at procedure or test/unavailable. Pt undergoing FEES. PT to re-attempt as time allows.   Ilda Foil 08/19/2016, 10:47 AM

## 2016-08-19 NOTE — Progress Notes (Signed)
PULMONARY  / CRITICAL CARE MEDICINE  Name: Wayne Benson MRN: [id number] DOB: 12/19/1955    LOS: 13  REFERRING MD :  Earl Lagos, MD - IMTS   BRIEF PATIENT DESCRIPTION:  Asked by Dr. Ladona Ridgel with IMTS to assess for dyspnea. 61 yo male smoker presented with 3 days of cough, fever 101F and fatigue.  Found to have influenza A.  Had progressive hypoxia and transitioned to Bipap.  CXR shows progressive infiltrates.  He had elevated lactic acid. He denies sinus congestion, sore throat, headache, abdominal pain, nausea, diarrhea, skin rash.  He feels his chest is tight but seems better since being started on Bipap  LINES / TUBES: PIV bil 4/17>>  CULTURES: Blood 4/17 >>NGx5d final Influenza PCR 4/17 >> Influenza A positive Pneumococcal Ag 4/18 >> negative Legionella Ag 4/18 >> negative C diff 4/19>> negative  ANTIBIOTICS: Rocephin 4/17 >> 4/17 Zithromax 4/17 >> 4/17 Tamiflu 4/17 >>4/22 Vancomycin 4/17 >>4/10 Zosyn 4/17 >>4/20 Ceftriaxone 4/20>>  SIGNIFICANT EVENTS:  4/17 Admit 4/18 To ICU for resp failure failing bipap. Echo 01/20/16 >> EF 40 to 45% CXR 4/18 - 4/22 b/l infiltrates. 4/24 moved out of ICU on 10 liters 4/25 down to 5 liters 4/26 oxygen requirements up again but CXR improved.  4/27 PCCM asked to re-eval d/t persistent elevated o2 needs  Subjective Feels better.  O2 requirements better  Objective  VITAL SIGNS: Temp:  [96.2 F (35.7 C)-98.6 F (37 C)] 96.2 F (35.7 C) (04/30 0901) Pulse Rate:  [25-89] 89 (04/30 0901) Resp:  [18-33] 22 (04/30 0901) BP: (100-139)/(57-71) 111/65 (04/30 0901) SpO2:  [86 %-97 %] 87 % (04/30 0901) FiO2 (%):  [50 %] 50 % (04/29 2133) Weight:  [173 lb 15.1 oz (78.9 kg)] 173 lb 15.1 oz (78.9 kg) (04/30 0257) HEMODYNAMICS:   VENTILATOR SETTINGS: FiO2 (%):  [50 %] 50 % INTAKE / OUTPUT:  Intake/Output Summary (Last 24 hours) at 08/19/16 4098 Last data filed at 08/19/16 0300  Gross per 24 hour  Intake              373 ml   Output             1650 ml  Net            -1277 ml   PHYSICAL EXAMINATION: General appearance:  61 Year old  Male, well nourished NAD,   conversant  Eyes: anicteric sclerae, moist conjunctivae; PERRL, EOMI bilaterally. Mouth:  membranes and no mucosal ulcerations; normal hard and soft palate Neck: Trachea midline; neck supple, no JVD Lungs/chest: dry posterior rales., with normal respiratory effort and no intercostal retractions CV: RRR, no MRGs  Abdomen: Soft, non-tender; no masses or HSM Extremities: No peripheral edema or extremity lymphadenopathy Skin: Normal temperature, turgor and texture; no rash, ulcers or subcutaneous nodules Psych: Appropriate affect, alert and oriented to person, place and time]  LABS: Cbc  Recent Labs Lab 08/17/16 0224 08/18/16 0159 08/19/16 0207  WBC 9.9 9.7 11.2*  HGB 13.5 13.5 13.8  HCT 39.7 39.5 40.3  PLT 71* 69* 66*    Chemistry   Recent Labs Lab 08/13/16 0248  08/17/16 0224 08/18/16 0159 08/19/16 0207  NA 139  < > 138 137 138  K 3.5  < > 3.5 4.1 4.0  CL 102  < > 101 97* 101  CO2 28  < > BUN 22*  < > 13 20 21*  CREATININE 0.54*  < > 0.61 0.66 0.58*  CALCIUM 8.4*  < >  8.8* 8.9 8.8*  MG 2.0  --  1.7  --   --   PHOS  --   --  2.9  --   --   GLUCOSE 136*  < > 134* 210* 239*  < > = values in this interval not displayed.  Liver fxn No results for input(s): AST, ALT, ALKPHOS, BILITOT, PROT, ALBUMIN in the last 168 hours. coags No results for input(s): APTT, INR in the last 168 hours. Sepsis markers No results for input(s): LATICACIDVEN, PROCALCITON in the last 168 hours. Cardiac markers No results for input(s): CKTOTAL, CKMB, TROPONINI in the last 168 hours. BNP No results for input(s): PROBNP in the last 168 hours. ABG  Recent Labs Lab 08/16/16 1210  PHART 7.495*  PCO2ART 35.4  PO2ART 54.8*  HCO3 27.0   CBG trend  Recent Labs Lab 08/18/16 0825 08/18/16 1228 08/18/16 1716 08/18/16 2211 08/19/16 0817   GLUCAP 218* 309* 321* 294* 282*    DIAGNOSES: Principal Problem:   Influenza A Active Problems:   Diabetes (HCC)   Esophageal varices in cirrhosis (HCC)   Tobacco abuse   GERD (gastroesophageal reflux disease)   Liver cirrhosis secondary to NASH (HCC)   Thrombocytopenia (HCC)   Pressure injury of skin   Respiratory failure (HCC)   ARDS (adult respiratory distress syndrome) (HCC)   Hypoxemia   ILD (interstitial lung disease) (HCC)   ASSESSMENT / PLAN: Acute hypoxic respiratory failure. Multifactorial: pneumonitis from influenza A w/ ARDS +/- CAP (NOS), complicated by volume overload & ? Underlying ILD. -CT was neg for PE; EF normal by echo 4/28  CRP: 8.7, sed rate 31 Antinuclear AB>>> HSP>>> sjogrens syndrome B>> sjogrens A>>> Cyclic cytrul peptide ab>>> RF>>> Anti-scleroderma>> Anti-jo 1 ab>>> Plan Cont to f/u pending auto-immune panel Cont to push lasix  Wean O2 Defer steroids to Dr Christene Slates  Simonne Martinet ACNP-BC [facility] Pulmonary/Critical Care Pager # [phone] OR # [phone] if no answer   ATTENDING NOTE / ATTESTATION NOTE :   I have discussed the case with the resident/APP  [name] NP.   I agree with the resident/APP's  history, physical examination, assessment, and plans.    I have edited the above note and modified it according to our agreed history, physical examination, assessment and plan.  Briefly,  61 yo male smoker presented with 3 days of cough, fever 101F and fatigue.  Found to have influenza A.  Had progressive hypoxia and transitioned to Bipap.  CXR shows progressive infiltrates.  He had elevated lactic acid. He denies sinus congestion, sore throat, headache, abdominal pain, nausea, diarrhea, skin rash.  PCCM was asked to see for severe hypoxemia. He improved over the weekend with diuresis.  He states he is 70% better than last week. On [place].   Vitals:  Vitals:   08/19/16 0901 08/19/16 0920 08/19/16 1225 08/19/16 1540  BP: 111/65  (!)  135/104 (!) 104/58  Pulse: 89  81 76  Resp: (!) 22  20   Temp: (!) 96.2 F (35.7 C)  97.6 F (36.4 C) 98.5 F (36.9 C)  TempSrc: Axillary  Axillary Oral  SpO2: (!) 87% 93% 95% 90%  Weight:      Height:        Constitutional/General: well-nourished, well-developed, in mild distress  Body mass index is 24.96 kg/m. Wt Readings from Last 3 Encounters:  08/19/16 78.9 kg (173 lb 15.1 oz)  01/22/16 79.4 kg (175 lb)    HEENT: PERLA, anicteric sclerae. (-) Oral thrush. Neck:  No masses. Midline trachea. No JVD, (-) LAD. (-) bruits appreciated.  Respiratory/Chest: Grossly normal chest. (-) deformity. (-) Accessory muscle use.  Symmetric expansion. Diminished BS on both lower lung zones. (-) wheezing,  Rhonchi Crackles at bases (-) egophony  Cardiovascular: Regular rate and  rhythm, heart sounds normal, no murmur or gallops,  Trace peripheral edema  Gastrointestinal:  Normal bowel sounds. Soft, non-tender. Distended abdomen. No hepatosplenomegaly.  (-) masses.   Musculoskeletal:  Normal muscle tone.   Extremities: Grossly normal. (-) clubbing, cyanosis.  Trace edema  Skin: (-) rash,lesions seen.   Neurological/Psychiatric : CN grossly intact. (-) lateralizing signs.     CBC Recent Labs     08/17/16  0224  08/18/16  0159  08/19/16  0207  WBC  9.9  9.7  11.2*  HGB  13.5  13.5  13.8  HCT  39.7  39.5  40.3  PLT  71*  69*  66*    Coag's No results for input(s): APTT, INR in the last 72 hours.  BMET Recent Labs     08/17/16  0224  08/18/16  0159  08/19/16  0207  NA  138  137  138  K  3.5  4.1  4.0  CL  101  97*  101  CO2  BUN  13  20  21*  CREATININE  0.61  0.66  0.58*  GLUCOSE  134*  210*  239*    Electrolytes Recent Labs     08/17/16  0224  08/18/16  0159  08/19/16  0207  CALCIUM  8.8*  8.9  8.8*  MG  1.7   --    --   PHOS  2.9   --    --     Sepsis Markers No results for input(s): PROCALCITON, O2SATVEN in the last 72  hours.  Invalid input(s): LACTICACIDVEN  ABG No results for input(s): PHART, PCO2ART, PO2ART in the last 72 hours.  Liver Enzymes No results for input(s): AST, ALT, ALKPHOS, BILITOT, ALBUMIN in the last 72 hours.  Cardiac Enzymes No results for input(s): TROPONINI, PROBNP in the last 72 hours.  Glucose Recent Labs     08/18/16  1228  08/18/16  1716  08/18/16  2211  08/19/16  0817  08/19/16  1224  08/19/16  1539  GLUCAP  309*  321*  294*  282*  387*  295*    Imaging Dg Chest 2 View  Result Date: 08/19/2016 CLINICAL DATA:  Follow-up pneumonia. Hypoxia, cough, shortness of breath. EXAM: CHEST  2 VIEW COMPARISON:  CTA chest 08/17/2016. Chest x-rays 08/15/2016, 08/12/2016 and earlier. FINDINGS: Peripheral airspace consolidation in both lungs, left greater than right, improved since the CT 2 days ago. No new pulmonary parenchymal abnormalities. No pleural effusions. No pneumothorax. Cardiac silhouette slightly enlarged, unchanged. Pulmonary vascularity normal. Degenerative changes involving the thoracic and upper lumbar spine. IMPRESSION: 1. Improving peripheral airspace opacities in both lungs, left greater than right. The peripheral location raises the possibility of the eosinophilic pneumonia. 2. Stable mild cardiomegaly without evidence of pulmonary edema. Electronically Signed   By: Hulan Saas M.D.   On: 08/19/2016 08:26   Assessment/Plan : Acute hypoxemic respiratory failure secondary to pulmonary edema + Influenza A.  Chest CT scan concerning for interstitial lung disease, specifically possible NSIP Liver cirrhosis with h/o esophageal varices  - cont to wean down FiO2 to keep o2 sats > 88% - cont diuresis as tolerated - cont duoneb - pt  is on medrol ordered on Sunday >> taper off over next couple of days - f/u on rheumatologic workup - will need a rpt chest ct scan in 4-6 weeks  Family : No family at bedside. Plan d/w pt.    Pollie Meyer, MD 08/19/2016, 5:19  PM [place] [facility] Pager [phone] After 3 pm or if no answer, call [phone]

## 2016-08-19 NOTE — Progress Notes (Signed)
   Subjective: Patient was evaluated this morning on rounds. He states that overnight he had no difficulty breathing. He states that today is the best he's felt during his hospitalization. He denies chest pain or shortness of breath.  Objective:  Vital signs in last 24 hours: Vitals:   08/19/16 0500 08/19/16 0820 08/19/16 0901 08/19/16 0920  BP: (!) 107/57  111/65   Pulse: (!) 25 85 89   Resp: 19 (!) 22 (!) 22   Temp:   (!) 96.2 F (35.7 C)   TempSrc:   Axillary   SpO2: 93% 95% (!) 87% 93%  Weight:      Height:       Physical Exam  Cardiovascular: Normal rate, regular rhythm and normal heart sounds.  Exam reveals no gallop and no friction rub.   No murmur heard. Pulmonary/Chest: Effort normal.  Faint bibasilar crackles  Musculoskeletal: He exhibits no edema.  Skin: Skin is warm and dry.     Assessment/Plan:  Principal Problem:   Influenza A  Acute Hypoxic Respiratory Failure: Patient continues to require HFNC and will attempt to wean today. Solumedrol was re-started yesterday and patient notes improvement in his breathing.  Will continue with steroids and consider high resolution CT chest for further characterization when patient improves clinically. Patient is on PO lasix and diuresing well.  Repeat Chest x-ray this morning  Reported improving peripheral airspace opacities in both lungs.  The peripheral location raises the possibility of eosinophilic pneumonia. Further pulmonary recommendations are appreciated. - Solu-medrol IV 40 mg BID - PO Lasix  BID  - Tussionex Q12H prn cough - Duonebs TID  - Mucinex BID - Albuterol Q2H prn  - Appreciate PCCMs recommendations - Continue PT  Chronic HFrEF: Echo was obtained yesterday and EF 50-55% with estimated normal CVP.  Previously EF 40-45% 12/2015. Does not appear to be volume overloaded on exam. He continues to diurese well with stable creatinine.  - Continue Lasix  PO BID - Daily weights - Strict I/Os - Monitor  BMET  Cirrhosis 2/2 NASH with Esophageal Varices and Thrombocytopenia: No signs of bleeding or altered mental status.  - Nadolol 20 mg QD - Protonix  QD  T2DM: A1c 12.0 on 08/06/2016. Patient is on Humulin 50-100 units TID at home. Patient has been restarted on half dose of home insulin regimen.  CBGs continue to be labile.  - Humulin R 50 U am with breakfast, 25 U with lunch, 50 U with supper - Gabapentin 900 mg QHS - SSI  DVT/PE ppx: IVC in place, SCDs Code status:  Full code Diet: Carb modified   Dispo: Anticipated discharge pending clinical course.   Camelia Phenes, DO 08/19/2016, 9:50 AM Pager: [phone]

## 2016-08-19 NOTE — Plan of Care (Signed)
Problem: Fluid Volume: Goal: Maintenance of adequate hydration will improve Outcome: Progressing Discussed the importance of his blood glucose levels with some teach back displayed

## 2016-08-20 DIAGNOSIS — J984 Other disorders of lung: Secondary | ICD-10-CM

## 2016-08-20 LAB — CBC
HCT: 39.7 % (ref 39.0–52.0)
HEMOGLOBIN: 13.7 g/dL (ref 13.0–17.0)
MCH: 31.4 pg (ref 26.0–34.0)
MCHC: 34.5 g/dL (ref 30.0–36.0)
MCV: 90.8 fL (ref 78.0–100.0)
Platelets: 69 10*3/uL — ABNORMAL LOW (ref 150–400)
RBC: 4.37 MIL/uL (ref 4.22–5.81)
RDW: 14.1 % (ref 11.5–15.5)
WBC: 13.1 10*3/uL — ABNORMAL HIGH (ref 4.0–10.5)

## 2016-08-20 LAB — BASIC METABOLIC PANEL
Anion gap: 7 (ref 5–15)
BUN: 20 mg/dL (ref 6–20)
CHLORIDE: 101 mmol/L (ref 101–111)
CO2: 26 mmol/L (ref 22–32)
CREATININE: 0.5 mg/dL — AB (ref 0.61–1.24)
Calcium: 8.4 mg/dL — ABNORMAL LOW (ref 8.9–10.3)
GFR calc Af Amer: >60 mL/min (ref >60)
GFR calc non Af Amer: >60 mL/min (ref >60)
Glucose, Bld: 232 mg/dL — ABNORMAL HIGH (ref 65–99)
Potassium: 4.2 mmol/L (ref 3.5–5.1)
Sodium: 134 mmol/L — ABNORMAL LOW (ref 135–145)

## 2016-08-20 LAB — GLUCOSE, CAPILLARY
GLUCOSE-CAPILLARY: 196 mg/dL — AB (ref 65–99)
GLUCOSE-CAPILLARY: 184 mg/dL — AB (ref 65–99)
Glucose-Capillary: 241 mg/dL — ABNORMAL HIGH (ref 65–99)
Glucose-Capillary: 332 mg/dL — ABNORMAL HIGH (ref 65–99)

## 2016-08-20 MED ORDER — PREDNISONE 20 MG PO TABS
40.0000 mg | ORAL_TABLET | Freq: Every day | ORAL | Status: DC
  Administered 2016-08-21: 40 mg via ORAL
  Filled 2016-08-20: qty 2

## 2016-08-20 NOTE — Progress Notes (Signed)
Nutrition Follow-up  DOCUMENTATION CODES:   Non-severe (moderate) malnutrition in context of acute illness/injury  INTERVENTION:    Continue Glucerna Shake po TID, each supplement provides 220 kcal and 10 grams of protein  NUTRITION DIAGNOSIS:   Malnutrition (Moderate) related to acute illness (influenza A) as evidenced by mild depletion of body fat, mild depletion of muscle mass  Ongoing  GOAL:   Patient will meet greater than or equal to 90% of their needs  Met  MONITOR:   PO intake, Supplement acceptance, Labs, Weight trends, I & O's  ASSESSMENT:   61 yo male smoker presented with 3 days of cough, fever 101F and fatigue.  Found to have influenza A.     4/26  Transferred from 85M-MICU to 4E-Surgical Progressive Care 5/01  Transferred from 4E to 5W-Medical  Pt s/p FEES 4/30.  Demonstrated normal swallow function. Appetite good.  PO intake 100% per flowsheet records. Drinking his Glucerna Shake nutrition supplements. Medications reviewed.  Labs reviewed.  Sodium 134 (L). CBG's [phone].  Diet Order:  Diet Carb Modified Fluid consistency: Thin; Room service appropriate? Yes  Skin:  Wound (see comment) (stage I to buttocks)  Last BM:  5/1  Height:   Ht Readings from Last 1 Encounters:  08/20/16 '5\' 10"'  (1.778 m)   Weight:   Wt Readings from Last 1 Encounters:  08/20/16 177 lb 4 oz (80.4 kg)   Ideal Body Weight:  75.5 kg  BMI:  Body mass index is 25.43 kg/m.  Estimated Nutritional Needs:   Kcal:  2200-2400  Protein:  100-120 gm  Fluid:  2.2-2.4 L  EDUCATION NEEDS:   No education needs identified at this time  Arthur Holms, RD, LDN Pager #: [phone] After-Hours Pager #: [phone]

## 2016-08-20 NOTE — Progress Notes (Signed)
   Subjective: Seen and evaluated today at bedside during rounds. Eating breakfast without distress. Reports SOB much improved although has DOE when walking to the nursing station and back. Starting to feel ready for d/c.  Objective:  Vital signs in last 24 hours: Vitals:   08/20/16 0400 08/20/16 0402 08/20/16 0500 08/20/16 0746  BP: 118/77  125/73 96/61  Pulse: 65  (!) 46 63  Resp: (!) 22  18 (!) 22  Temp:    97.7 F (36.5 C)  TempSrc:    Oral  SpO2: 94%  93% 95%  Weight:  173 lb 1 oz (78.5 kg)    Height:       Physical Exam  Constitutional: He is well-developed, well-nourished, and in no distress.  Eating breakfast without distress  Cardiovascular: Normal rate, regular rhythm and normal heart sounds.  Exam reveals no gallop and no friction rub.   No murmur heard. Pulmonary/Chest: Effort normal. He has no wheezes.  Able to speak in complete sentences.  Faint bibasilar crackles  Abdominal: Soft. Bowel sounds are normal. There is no tenderness.  Musculoskeletal: He exhibits no edema.  Skin: Skin is warm and dry. He is not diaphoretic.  Psychiatric: Mood and affect normal.  Pleasant and conversational   Assessment/Plan:  Principal Problem:   Influenza A  Acute Hypoxic Respiratory Failure 2/2 Influenza A & ILD: Now on 4.5L via [place]. Notices improvement with steroids. He will require a long taper. Stable for transfer to med/surg. -Transition IV solumedrol --> PO prednisone  daily. Monitor for worsening SOB, may need twice daily dosing -Ambulate today -Continue weaning off O2 -Hold lasix -Tussionex Q12H prn cough -Duonebs BID  -Mucinex BID -Albuterol prn  -PT  Chronic HFrEF: Echo 4/28 w/ EF 50-55% with estimated normal CVP.  Previously EF 40-45% 12/2015. Does not appear to be volume overloaded on exam. Cr stable with diuresis. 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  - Nadolol 20 mg QD - Protonix  QD  T2DM: A1c 12.0 on 08/06/2016. Humulin 50-100 units TID at home. Patient has been restarted on half dose of home insulin regimen. CBGs with hyperglycemia in 200's-300's.  -Incr humulin: 60 U am with breakfast, 30 U with lunch, 60 U with supper - Gabapentin 900 mg QHS - SSI  DVT/PE ppx: IVC in place, SCDs Code status:  Full code Diet: Carb modified   Dispo: Anticipated discharge in 1-2 days.   Wayne Bacchi, DO 08/20/2016, 8:13 AM Pager: [phone]

## 2016-08-20 NOTE — Progress Notes (Signed)
PULMONARY  / CRITICAL CARE MEDICINE  Name: Wayne Benson MRN: [id number] DOB: 1956/03/26    LOS: 14  REFERRING MD :  Earl Lagos, MD - IMTS   BRIEF PATIENT DESCRIPTION:  Asked by Dr. Ladona Ridgel with [facility]MT[facility] to assess for dyspnea. 61 yo male smoker presented with 3 days of cough, fever 101F and fatigue.  Found to have influenza A.  Had progressive hypoxia and transitioned to Bipap.  CXR shows progressive infiltrates.  He had elevated lactic acid. He denies sinus congestion, sore throat, headache, abdominal pain, nausea, diarrhea, skin rash.  He feels his chest is tight but seems better since being started on Bipap  LINES / TUBES: PIV bil 4/17>>  CULTURES: Blood 4/17 >>NGx5d final Influenza PCR 4/17 >> Influenza A positive Pneumococcal Ag 4/18 >> negative Legionella Ag 4/18 >> negative C diff 4/19>> negative  ANTIBIOTICS: Rocephin 4/17 >> 4/17 Zithromax 4/17 >> 4/17 Tamiflu 4/17 >>4/22 Vancomycin 4/17 >>4/10 Zosyn 4/17 >>4/20 Ceftriaxone 4/20>>off  SIGNIFICANT EVENTS:  4/17 Admit 4/18 To ICU for resp failure failing bipap. Echo 01/20/16 >> EF 40 to 45% CXR 4/18 - 4/22 b/l infiltrates. 4/24 moved out of ICU on 10 liters 4/25 down to 5 liters 4/26 oxygen requirements up again but CXR improved.  4/27 PCCM asked to re-eval d/t persistent elevated o2 needs  Subjective Feels better  Objective  VITAL SIGNS: Temp:  [97.6 F (36.4 C)-98.5 F (36.9 C)] 97.7 F (36.5 C) (05/01 1151) Pulse Rate:  [33-93] 60 (05/01 1151) Resp:  [18-30] 28 (05/01 1151) BP: (94-135)/(55-104) 102/61 (05/01 1151) SpO2:  [88 %-95 %] 92 % (05/01 1151) Weight:  [173 lb 1 oz (78.5 kg)] 173 lb 1 oz (78.5 kg) (05/01 0402) 4.5 liters HEMODYNAMICS:   VENTILATOR SETTINGS:   INTAKE / OUTPUT:  Intake/Output Summary (Last 24 hours) at 08/20/16 1220 Last data filed at 08/20/16 0645  Gross per 24 hour  Intake              370 ml  Output             1300 ml  Net             -930 ml   PHYSICAL  EXAMINATION: General appearance:  61 Year old  Male, well nourishe NAD,  conversant  Eyes: anicteric sclerae, moist conjunctivae; PERRL, EOMI bilaterally. Mouth:  membranes and no mucosal ulcerations; normal hard and soft palate Neck: Trachea midline; neck supple, no JVD Lungs/chest: dry velcro crackles posteriorly , with normal respiratory effort and no intercostal retractions CV: RRR, no MRGs  Abdomen: Soft, non-tender; no masses or HSM Extremities: No peripheral edema or extremity lymphadenopathy Skin: Normal temperature, turgor and texture; no rash, ulcers or subcutaneous nodules Neuro/Psych: Appropriate affect, alert and oriented to person, place and time    LABS: Cbc  Recent Labs Lab 08/18/16 0159 08/19/16 0207 08/20/16 0322  WBC 9.7 11.2* 13.1*  HGB 13.5 13.8 13.7  HCT 39.5 40.3 39.7  PLT 69* 66* 69*    Chemistry   Recent Labs Lab 08/17/16 0224 08/18/16 0159 08/19/16 0207 08/20/16 0322  NA 138 137 138 134*  K 3.5 4.1 4.0 4.2  CL 101 97* 101 101  CO2 BUN 13 20 21* 20  CREATININE 0.61 0.66 0.58* 0.50*  CALCIUM 8.8* 8.9 8.8* 8.4*  MG 1.7  --   --   --   PHOS 2.9  --   --   --   GLUCOSE 134* 210* 239*  232*    Liver fxn No results for input(s): AST, ALT, ALKPHOS, BILITOT, PROT, ALBUMIN in the last 168 hours. coags No results for input(s): APTT, INR in the last 168 hours. Sepsis markers No results for input(s): LATICACIDVEN, PROCALCITON in the last 168 hours. Cardiac markers No results for input(s): CKTOTAL, CKMB, TROPONINI in the last 168 hours. BNP No results for input(s): PROBNP in the last 168 hours. ABG  Recent Labs Lab 08/16/16 1210  PHART 7.495*  PCO2ART 35.4  PO2ART 54.8*  HCO3 27.0   CBG trend  Recent Labs Lab 08/19/16 1224 08/19/16 1539 08/19/16 2243 08/20/16 0749 08/20/16 1200  GLUCAP 387* 295* 113* 241* 332*    DIAGNOSES: Principal Problem:   Influenza A Active Problems:   Diabetes (HCC)   Esophageal  varices in cirrhosis (HCC)   Tobacco abuse   GERD (gastroesophageal reflux disease)   Liver cirrhosis secondary to NASH (HCC)   Thrombocytopenia (HCC)   Respiratory failure (HCC)   ARDS (adult respiratory distress syndrome) (HCC)   Hypoxemia   ILD (interstitial lung disease) (HCC)   Acute hypoxemic respiratory failure (HCC)   ASSESSMENT / PLAN: Acute hypoxic respiratory failure. Multifactorial: pneumonitis after influenza A w/ ARDS +/- CAP, complicated by volume overload and probably underlying ILD.  -CT was neg for PE; EF normal by echo 4/28 -steroids started on 4/29.  -clinically improved w/ lasix +/- steroids. Autoimmune panel has been negative to date  CRP: 8.7, sed rate 31 Antinuclear AB>>> HSP>>> sjogrens syndrome B>>neg sjogrens A>>>neg Cyclic cytrul peptide ab>>>neg RF>>> Anti-scleroderma>>neg Anti-jo 1 ab>>>  Plan Cont lasix as BUN/cr allow Taper steroids over next couple days Wean O2  f/u w/ NP [place] May 15 at 3pm we can order Repeat CT for first week of June at that point.  Then f/u w/ Dr Jamison Neighbor on June 21 at 1015 to review CT findings.  We will s/o   Simonne Martinet ACNP-BC [facility] Pulmonary/Critical Care Pager # [phone] OR # [phone] if no answer   ATTENDING NOTE / ATTESTATION NOTE :   I have discussed the case with the resident/APP  [name]  I agree with the resident/APP's  history, physical examination, assessment, and plans.    I have edited the above note and modified it according to our agreed history, physical examination, assessment and plan.  Briefly,  61 yo male smoker presented with 3 days of cough, fever 101F and fatigue. Found to have influenza A. Had progressive hypoxia and transitioned to Bipap. CXR shows progressive infiltrates. He had elevated lactic acid. He denies sinus congestion, sore throat, headache, abdominal pain, nausea, diarrhea, skin rash. PCCM was asked to see for severe hypoxemia. He improved over the weekend with  diuresis.  Steroids were also started on Sunday by primary service. He is improved, on 5 L [place].  PE as above by [facility].   Acute hypoxemic respiratory failure secondary to pulmonary edema + Influenza A.  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 Currently on 5L [place] - cont diuresis as tolerated - cont duoneb - pt is on medrol ordered on Sunday >> taper off over next couple of days - f/u on rheumatologic workup. So far, it has been (-).  - will need a rpt chest ct scan in 4-6 weeks - f/u with Pulmonary as written above.  - PCCM will sign off for now.  Call back if with issues.    Pollie Meyer, MD 08/20/2016, 2:29 PM [place] [facility] Pager [phone] After 3 pm or if no answer, call [phone]

## 2016-08-20 NOTE — Care Management Note (Addendum)
Case Management Note  Patient Details  Name: Wayne Benson MRN: [id number] Date of Birth: 1956-03-16  Subjective/Objective:  From home with wife, presents with resp failure secondary to Flu and PNA and possible bacterial superinfection. Patient is a Cytogeneticist, goes to [employer], PCP is Dr. Boone Master fax [phone].  He will need HHPT, NCM offered patient choice, he chose [facility],  NCM spoke with Marcille Blanco at the [facility], she states she needs orders, face sheet and h/p faxed to her at [phone] attention Victorino Dike.  NCM  Informed Victorino Dike that [facility] will need the authorization before they can take patient, she states she will be working on that after she receives the fax.  NCM also inquired about home oxygen if patient will need this , she states if he needs home oxygen to fax orders for oxygen and and pulse ox sat checks, h/p to [place] or [place] at [phone].  NCM faxed HHPT orders and face sheet and h/p on 5/1 at 12:20.                Action/Plan: PTA indep, NCM will cont to follow for dc needs.  Expected Discharge Date:                  Expected Discharge Plan:  Home w Home Health Services  In-House Referral:     Discharge planning Services  CM Consult  Post Acute Care Choice:  Home Health Choice offered to:  Patient  DME Arranged:    DME Agency:     HH Arranged:  PT HH Agency:  [facility]  Status of Service:  In process, will continue to follow  If discussed at Long Length of Stay Meetings, dates discussed:    Additional Comments:  Leone Haven, RN 08/20/2016, 12:04 PM

## 2016-08-21 DIAGNOSIS — Z888 Allergy status to other drugs, medicaments and biological substances status: Secondary | ICD-10-CM

## 2016-08-21 DIAGNOSIS — E1110 Type 2 diabetes mellitus with ketoacidosis without coma: Secondary | ICD-10-CM

## 2016-08-21 DIAGNOSIS — L8990 Pressure ulcer of unspecified site, unspecified stage: Secondary | ICD-10-CM

## 2016-08-21 DIAGNOSIS — I5023 Acute on chronic systolic (congestive) heart failure: Secondary | ICD-10-CM

## 2016-08-21 DIAGNOSIS — K219 Gastro-esophageal reflux disease without esophagitis: Secondary | ICD-10-CM

## 2016-08-21 LAB — GLUCOSE, CAPILLARY
GLUCOSE-CAPILLARY: 102 mg/dL — AB (ref 65–99)
Glucose-Capillary: 278 mg/dL — ABNORMAL HIGH (ref 65–99)
Glucose-Capillary: 245 mg/dL — ABNORMAL HIGH (ref 65–99)

## 2016-08-21 LAB — HYPERSENSITIVITY PNEUMONITIS
A. FUMIGATUS #1 ABS: NEGATIVE
A. Pullulans Abs: NEGATIVE
MICROPOLYSPORA FAENI IGG: NEGATIVE
Pigeon Serum Abs: NEGATIVE
Thermoact. Saccharii: NEGATIVE
Thermoactinomyces vulgaris, IgG: NEGATIVE

## 2016-08-21 LAB — BASIC METABOLIC PANEL
Anion gap: 7 (ref 5–15)
BUN: 23 mg/dL — ABNORMAL HIGH (ref 6–20)
CALCIUM: 8.6 mg/dL — AB (ref 8.9–10.3)
CO2: 28 mmol/L (ref 22–32)
Chloride: 104 mmol/L (ref 101–111)
Creatinine, Ser: 0.63 mg/dL (ref 0.61–1.24)
GFR calc non Af Amer: >60 mL/min (ref >60)
Glucose, Bld: 101 mg/dL — ABNORMAL HIGH (ref 65–99)
Potassium: 3.9 mmol/L (ref 3.5–5.1)
SODIUM: 139 mmol/L (ref 135–145)

## 2016-08-21 LAB — CBC
HCT: 41 % (ref 39.0–52.0)
Hemoglobin: 13.9 g/dL (ref 13.0–17.0)
MCH: 31 pg (ref 26.0–34.0)
MCHC: 33.9 g/dL (ref 30.0–36.0)
MCV: 91.5 fL (ref 78.0–100.0)
PLATELETS: 69 10*3/uL — AB (ref 150–400)
RBC: 4.48 MIL/uL (ref 4.22–5.81)
RDW: 14.4 % (ref 11.5–15.5)
WBC: 11 10*3/uL — ABNORMAL HIGH (ref 4.0–10.5)

## 2016-08-21 LAB — RHEUMATOID FACTOR

## 2016-08-21 MED ORDER — GLUCERNA SHAKE PO LIQD: 237.0000 mL | Freq: Three times a day (TID) | ORAL | 2 refills | Status: DC

## 2016-08-21 MED ORDER — IPRATROPIUM-ALBUTEROL 0.5-2.5 (3) MG/3ML IN SOLN: 3.0000 mL | Freq: Four times a day (QID) | RESPIRATORY_TRACT | Status: DC | PRN

## 2016-08-21 MED ORDER — PREDNISONE 20 MG PO TABS: 40.0000 mg | ORAL_TABLET | Freq: Every day | ORAL | 0 refills | Status: DC

## 2016-08-21 MED ORDER — HYDROCOD POLST-CPM POLST ER 10-8 MG/5ML PO SUER: 5.0000 mL | Freq: Two times a day (BID) | ORAL | 0 refills | Status: DC | PRN

## 2016-08-21 NOTE — Progress Notes (Signed)
CM received order for DME: oxygen. CM  faxed orders for oxygen and pulse ox sat checks, h/p to [place] or [place] at [phone]. Awaiting response from Morrie Sheldon or [place] [facility]. Gae Gallop RN,CM

## 2016-08-21 NOTE — Progress Notes (Addendum)
CM spoke with [facility] SW/ [place] ,[phone], ext. [phone] regarding DME : oxygen. Morrie Sheldon stated once given approval from [facility], [facility] is to bring oxygen out to pt's  home and  family is to bring portable tank to hospital for pt's transportation to home. Allow a 4 hr window for processing once order received from [facility] per Morrie Sheldon. Gae Gallop RN,BSN,CM

## 2016-08-21 NOTE — Progress Notes (Signed)
Physical Therapy Treatment Patient Details Name: Wayne Benson MRN: [id number] DOB: [birth year] Today's Date: 08/21/2016    History of Present Illness 61 yo admitted with respiratory failure due to flu A and PNA s/p bipap, pulmonary fibrosis.  PMHx: DM, cirrhosis, thrombocytopenia    PT Comments    Pt ambulated 240' without an assistive device with no LOB, SaO2 84% on 1L O2, 88% on 2L O2. May benefit from outpatient cardiopulmonary rehab.   Follow Up Recommendations  Home health PT;Supervision/Assistance - 24 hour;Other (comment) (outpatient cardiopulmonary rehab vs HHPT)     Equipment Recommendations  None recommended by PT    Recommendations for Other Services       Precautions / Restrictions Precautions Precautions: Fall;Other (comment) Precaution Comments: Watch O2 sats Restrictions Weight Bearing Restrictions: No    Mobility  Bed Mobility               General bed mobility comments: up in recliner  Transfers Overall transfer level: Modified independent Equipment used: None   Sit to Stand: Modified independent (Device/Increase time)         General transfer comment: used armrests  Ambulation/Gait Ambulation/Gait assistance: Supervision Ambulation Distance (Feet): 220 Feet Assistive device: None Gait Pattern/deviations: Step-through pattern;Decreased step length - right Gait velocity: WNL Gait velocity interpretation: at or above normal speed for age/gender General Gait Details: SaO2 84% on 1L O2 with ambulation, 88% on 2L O2 [place],  a couple episodes of slight drifting but no LOB, VCs for pursed lip breathing   Stairs            Wheelchair Mobility    Modified Rankin (Stroke Patients Only)       Balance   Sitting-balance support: No upper extremity supported;Feet supported Sitting balance-Leahy Scale: Good     Standing balance support: No upper extremity supported;During functional activity Standing balance-Leahy Scale: Good                               Cognition Arousal/Alertness: Awake/alert Behavior During Therapy: WFL for tasks assessed/performed Overall Cognitive Status: Within Functional Limits for tasks assessed                                        Exercises General Exercises - Lower Extremity Ankle Circles/Pumps: AROM;Both;10 reps;Supine Gluteal Sets: AROM;Both;10 reps;Supine Shoulder Exercises Shoulder Flexion: AROM;Both;10 reps;Seated    General Comments        Pertinent Vitals/Pain Pain Assessment: No/denies pain    Home Living                      Prior Function            PT Goals (current goals can now be found in the care plan section) Acute Rehab PT Goals Patient Stated Goal: return home and to fishing PT Goal Formulation: With patient Time For Goal Achievement: 08/27/16 Potential to Achieve Goals: Good Progress towards PT goals: Progressing toward goals    Frequency    Min 3X/week      PT Plan Current plan remains appropriate    Co-evaluation              AM-PAC PT "6 Clicks" Daily Activity  Outcome Measure  Difficulty turning over in bed (including adjusting bedclothes, sheets and blankets)?: None Difficulty moving from lying on back to sitting  on the side of the bed? : None Difficulty sitting down on and standing up from a chair with arms (e.g., wheelchair, bedside commode, etc,.)?: None Help needed moving to and from a bed to chair (including a wheelchair)?: None Help needed walking in hospital room?: A Little Help needed climbing 3-5 steps with a railing? : A Little 6 Click Score: 22    End of Session Equipment Utilized During Treatment: Gait belt;Oxygen Activity Tolerance: Patient tolerated treatment well Patient left: in chair;with call bell/phone within reach Nurse Communication: Mobility status PT Visit Diagnosis: Unsteadiness on feet (R26.81);Muscle weakness (generalized) (M62.81)     Time: 1610-9604 PT Time  Calculation (min) (ACUTE ONLY): 18 min  Charges:  $Gait Training: 8-22 mins                    G Codes:          Tamala Ser 08/21/2016, 12:26 PM [phone]

## 2016-08-21 NOTE — Progress Notes (Signed)
   Subjective: Seen and evaluated today at bedside during rounds. Feeling well. SOB improved. No events overnight.   Objective:  Vital signs in last 24 hours: Vitals:   08/21/16 0506 08/21/16 0900 08/21/16 0925 08/21/16 1031  BP: 96/60     Pulse: (!) 43     Resp: 18     Temp: 97.5 F (36.4 C)     TempSrc: Oral     SpO2: 94% 97% 92% (!) 87%  Weight:      Height:       Physical Exam  Constitutional: He is well-developed, well-nourished, and in no distress.  Cardiovascular: Normal rate, regular rhythm and normal heart sounds.  Exam reveals no gallop and no friction rub.   No murmur heard. Pulmonary/Chest: Effort normal and breath sounds normal. He has no wheezes.  Able to speak in complete sentences.   Abdominal: Soft. Bowel sounds are normal. There is no tenderness.  Musculoskeletal: He exhibits no edema.  Skin: Skin is warm and dry. He is not diaphoretic.  Psychiatric: Mood and affect normal.  Pleasant and conversational   Assessment/Plan:  Principal Problem:   Influenza A  Acute Hypoxic Respiratory Failure 2/2 Influenza A & ILD: Ambulated well however required 1L O2 to saturate at 92%. Pt continues to improve, feels well enough for d/c today.  -Prednisone  daily, will need slow taper -Tussionex Q12H prn cough -Duonebs PRN -Albuterol prn   Chronic HFrEF: Does not appear to be volume overloaded on exam. Cr remains stable. 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  -Nadolol 20 mg QD -Protonix  QD  T2DM: A1c 12.0. Humulin 50-100 units TID at home. CBG much improved since increasing insulin yesterday.  -Continue Humulin: 60 U am with breakfast, 30 U with lunch, 60 U with supper - Gabapentin 900 mg QHS - SSI  DVT/PE ppx: SCDs Code status:  Full code Diet: Carb modified   Dispo: Anticipated discharge today if still doing  well on reassessment this afternoon.   Charod Slawinski, DO 08/21/2016, 11:21 AM Pager: [phone]

## 2016-08-21 NOTE — Progress Notes (Signed)
SATURATION QUALIFICATIONS: (This note is used to comply with regulatory documentation for home oxygen)  Patient Saturations on Room Air at Rest = 89%  Patient Saturations on Room Air while Ambulating = 86%  Patient Saturations on 1 Liters of oxygen while Ambulating =92 %  Please briefly explain why patient needs home oxygen: pt qualifies for home O2. Pt ambulated approximately 300 feet.

## 2016-08-21 NOTE — Care Management Note (Signed)
Case Management Note  Patient Details  Name: Wayne Benson MRN: [id number] Date of Birth: 23-Feb-1956  Subjective/Objective:      Admitted with respiratory failure due to flu A and PNA s/p bipap, pulmonary fibrosis.  PMHx: DM, cirrhosis, thrombocytopenia.            Action/Plan: Plan is to d/c to home today. Wife is to bring in portable oxygen tank for pt's transportation to home.  Expected Discharge Date:  08/21/16               Expected Discharge Plan:  Home w Home Health Services  In-House Referral:     Discharge planning Services  CM Consult  Post Acute Care Choice:  Home Health Choice offered to:  Patient  DME Arranged:   oxygen  DME Agency:   [facility],  Wife is to bring in portable for pt's transportation to home.  HH Arranged:  PT HH Agency:  [facility],   Status of Service:  completed  If discussed at Long Length of Stay Meetings, dates discussed:    Additional Comments:  Epifanio Lesches, RN 08/21/2016, 4:39 PM

## 2016-08-21 NOTE — Progress Notes (Addendum)
Transitions of Care Pharmacy Note  Plan:  - Educated on prednisone taper and how this could affect his blood sugar.  - Educated to follow insulin dosing instructions on discharge paperwork and to regularly check his blood glucose throughout the day.  -Recommend an early outpatient follow-up to ensure patient is well controlled on diabetes regimen. - Patient gets all medications through the [place] and has no problems with affordability.  --------------------------------------------- Wayne Benson is an 61 y.o. male who presents with a chief complaint of Influenza A.  In anticipation of discharge, pharmacy has reviewed this patient's prior to admission medication history, as well as current inpatient medications listed per the [facility].  Current medication indications, dosing, frequency, and notable side effects reviewed with patient. patient verbalized understanding of current inpatient medication regimen and is aware that the After Visit Summary when presented, will represent the most accurate medication list at discharge.   Wayne Benson expressed no concerns. We talked about his insulin.  He states that he takes 100 units in the morning and evening and 50 at lunchtime. I explained that we had been giving it a little bit differently here and that his discharge orders would have the insulin dose he should use. We also talked about how steroids can increase blood sugars and I instructed him to be diligent about regularly checking his glucose.  Assessment: Understanding of regimen: good Understanding of indications: good Potential of compliance: good Barriers to Obtaining Medications: No- Gets meds through the [facility]  Patient instructed to contact inpatient pharmacy team with further questions or concerns if needed.    Time spent preparing for discharge counseling: 15 minutes Time spent counseling patient: 20 minutes    Thank you for allowing pharmacy to be a part of this patient's care.  Hillis Range, PharmD PGY1 Pharmacy Resident Pager: [phone]

## 2016-08-21 NOTE — Progress Notes (Signed)
CM received call from [facility] and was informed DME: oxygen has been approved and will contact pt/family to setup home oxygen. Gae Gallop RN,BSN,CM

## 2016-08-21 NOTE — Progress Notes (Signed)
Results for RAQUAN, IANNONE (MRN [id number]) as of 08/21/2016 13:36  Ref. Range 08/20/2016 12:00 08/20/2016 16:40 08/20/2016 21:22 08/21/2016 07:58 08/21/2016 11:53  Glucose-Capillary Latest Ref Range: 65 - 99 mg/dL 841 (H) 324 (H) 401 (H) 102 (H) 278 (H)  Noted that lunch time CBGs have been greater than 200 mg/dl. Recommend increasing U-500 insulin at breakfast to 65 units. This should help the lunch time CBGs that have been elevated the last 2 days. Will continue to monitor blood sugars while in the hospital.   Smith Mince RN BSN CDE Diabetes Coordinator Pager: [phone]  8am-5pm

## 2016-08-23 ENCOUNTER — Emergency Department (HOSPITAL_COMMUNITY): Payer: Non-veteran care

## 2016-08-23 ENCOUNTER — Inpatient Hospital Stay (HOSPITAL_COMMUNITY)
Admission: EM | Admit: 2016-08-23 | Discharge: 2016-09-02 | DRG: 196 | Disposition: A | Discharge status: Skilled Nursing Facility | Payer: Non-veteran care | Attending: Student in an Organized Health Care Education/Training Program | Admitting: Student in an Organized Health Care Education/Training Program

## 2016-08-23 ENCOUNTER — Encounter (HOSPITAL_COMMUNITY): Payer: Self-pay

## 2016-08-23 DIAGNOSIS — F17200 Nicotine dependence, unspecified, uncomplicated: Secondary | ICD-10-CM | POA: Diagnosis not present

## 2016-08-23 DIAGNOSIS — Z95828 Presence of other vascular implants and grafts: Secondary | ICD-10-CM

## 2016-08-23 DIAGNOSIS — E1142 Type 2 diabetes mellitus with diabetic polyneuropathy: Secondary | ICD-10-CM | POA: Diagnosis not present

## 2016-08-23 DIAGNOSIS — I8510 Secondary esophageal varices without bleeding: Secondary | ICD-10-CM | POA: Diagnosis not present

## 2016-08-23 DIAGNOSIS — Z888 Allergy status to other drugs, medicaments and biological substances status: Secondary | ICD-10-CM

## 2016-08-23 DIAGNOSIS — K7469 Other cirrhosis of liver: Secondary | ICD-10-CM

## 2016-08-23 DIAGNOSIS — F419 Anxiety disorder, unspecified: Secondary | ICD-10-CM | POA: Diagnosis present

## 2016-08-23 DIAGNOSIS — J9601 Acute respiratory failure with hypoxia: Secondary | ICD-10-CM | POA: Diagnosis present

## 2016-08-23 DIAGNOSIS — K760 Fatty (change of) liver, not elsewhere classified: Secondary | ICD-10-CM

## 2016-08-23 DIAGNOSIS — J849 Interstitial pulmonary disease, unspecified: Principal | ICD-10-CM | POA: Diagnosis present

## 2016-08-23 DIAGNOSIS — E11649 Type 2 diabetes mellitus with hypoglycemia without coma: Secondary | ICD-10-CM | POA: Diagnosis not present

## 2016-08-23 DIAGNOSIS — Z79899 Other long term (current) drug therapy: Secondary | ICD-10-CM

## 2016-08-23 DIAGNOSIS — F209 Schizophrenia, unspecified: Secondary | ICD-10-CM | POA: Diagnosis present

## 2016-08-23 DIAGNOSIS — J9621 Acute and chronic respiratory failure with hypoxia: Secondary | ICD-10-CM | POA: Diagnosis not present

## 2016-08-23 DIAGNOSIS — Z8249 Family history of ischemic heart disease and other diseases of the circulatory system: Secondary | ICD-10-CM

## 2016-08-23 DIAGNOSIS — Z86718 Personal history of other venous thrombosis and embolism: Secondary | ICD-10-CM

## 2016-08-23 DIAGNOSIS — Z794 Long term (current) use of insulin: Secondary | ICD-10-CM

## 2016-08-23 DIAGNOSIS — K7581 Nonalcoholic steatohepatitis (NASH): Secondary | ICD-10-CM | POA: Diagnosis present

## 2016-08-23 DIAGNOSIS — T380X5A Adverse effect of glucocorticoids and synthetic analogues, initial encounter: Secondary | ICD-10-CM | POA: Diagnosis present

## 2016-08-23 DIAGNOSIS — Z8701 Personal history of pneumonia (recurrent): Secondary | ICD-10-CM

## 2016-08-23 DIAGNOSIS — D696 Thrombocytopenia, unspecified: Secondary | ICD-10-CM | POA: Diagnosis not present

## 2016-08-23 DIAGNOSIS — Z8379 Family history of other diseases of the digestive system: Secondary | ICD-10-CM

## 2016-08-23 DIAGNOSIS — E162 Hypoglycemia, unspecified: Secondary | ICD-10-CM | POA: Diagnosis present

## 2016-08-23 DIAGNOSIS — J9811 Atelectasis: Secondary | ICD-10-CM | POA: Diagnosis not present

## 2016-08-23 DIAGNOSIS — R188 Other ascites: Secondary | ICD-10-CM | POA: Diagnosis present

## 2016-08-23 DIAGNOSIS — Z7952 Long term (current) use of systemic steroids: Secondary | ICD-10-CM

## 2016-08-23 DIAGNOSIS — D6959 Other secondary thrombocytopenia: Secondary | ICD-10-CM | POA: Diagnosis present

## 2016-08-23 DIAGNOSIS — Z9981 Dependence on supplemental oxygen: Secondary | ICD-10-CM

## 2016-08-23 DIAGNOSIS — E1165 Type 2 diabetes mellitus with hyperglycemia: Secondary | ICD-10-CM | POA: Diagnosis present

## 2016-08-23 DIAGNOSIS — F319 Bipolar disorder, unspecified: Secondary | ICD-10-CM | POA: Diagnosis present

## 2016-08-23 DIAGNOSIS — F17210 Nicotine dependence, cigarettes, uncomplicated: Secondary | ICD-10-CM | POA: Diagnosis present

## 2016-08-23 DIAGNOSIS — K219 Gastro-esophageal reflux disease without esophagitis: Secondary | ICD-10-CM | POA: Diagnosis not present

## 2016-08-23 DIAGNOSIS — K7460 Unspecified cirrhosis of liver: Secondary | ICD-10-CM | POA: Diagnosis present

## 2016-08-23 LAB — CBC WITH DIFFERENTIAL/PLATELET
BASOS ABS: 0 10*3/uL (ref 0.0–0.1)
BASOS PCT: 0 %
EOS ABS: 0 10*3/uL (ref 0.0–0.7)
Eosinophils Relative: 0 %
HCT: 39.8 % (ref 39.0–52.0)
HEMOGLOBIN: 13.1 g/dL (ref 13.0–17.0)
Lymphocytes Relative: 12 %
Lymphs Abs: 1 10*3/uL (ref 0.7–4.0)
MCH: 30.3 pg (ref 26.0–34.0)
MCHC: 32.9 g/dL (ref 30.0–36.0)
MCV: 91.9 fL (ref 78.0–100.0)
MONOS PCT: 3 %
Monocytes Absolute: 0.3 10*3/uL (ref 0.1–1.0)
NEUTROS ABS: 7.3 10*3/uL (ref 1.7–7.7)
NEUTROS PCT: 85 %
Platelets: 60 10*3/uL — ABNORMAL LOW (ref 150–400)
RBC: 4.33 MIL/uL (ref 4.22–5.81)
RDW: 14.8 % (ref 11.5–15.5)
WBC: 8.6 10*3/uL (ref 4.0–10.5)

## 2016-08-23 LAB — COMPREHENSIVE METABOLIC PANEL
ALBUMIN: 2.3 g/dL — AB (ref 3.5–5.0)
ALK PHOS: 111 U/L (ref 38–126)
ALT: 102 U/L — ABNORMAL HIGH (ref 17–63)
ANION GAP: 9 (ref 5–15)
AST: 100 U/L — AB (ref 15–41)
BUN: 19 mg/dL (ref 6–20)
CO2: 23 mmol/L (ref 22–32)
Calcium: 8.5 mg/dL — ABNORMAL LOW (ref 8.9–10.3)
Chloride: 110 mmol/L (ref 101–111)
Creatinine, Ser: 0.58 mg/dL — ABNORMAL LOW (ref 0.61–1.24)
GFR calc Af Amer: >60 mL/min (ref >60)
GFR calc non Af Amer: >60 mL/min (ref >60)
GLUCOSE: 76 mg/dL (ref 65–99)
POTASSIUM: 3.6 mmol/L (ref 3.5–5.1)
SODIUM: 142 mmol/L (ref 135–145)
Total Bilirubin: 1.1 mg/dL (ref 0.3–1.2)
Total Protein: 5.3 g/dL — ABNORMAL LOW (ref 6.5–8.1)

## 2016-08-23 LAB — I-STAT CG4 LACTIC ACID, ED: Lactic Acid, Venous: 2.89 mmol/L (ref 0.5–1.9)

## 2016-08-23 LAB — CBG MONITORING, ED
GLUCOSE-CAPILLARY: 47 mg/dL — AB (ref 65–99)
GLUCOSE-CAPILLARY: 118 mg/dL — AB (ref 65–99)

## 2016-08-23 MED ORDER — DEXTROSE 50 % IV SOLN
INTRAVENOUS | Status: AC
  Administered 2016-08-23: 50 mL via INTRAVENOUS
  Filled 2016-08-23: qty 50

## 2016-08-23 MED ORDER — NADOLOL 20 MG PO TABS
20.0000 mg | ORAL_TABLET | Freq: Every day | ORAL | Status: DC
  Administered 2016-08-24 – 2016-09-02 (×10): 20 mg via ORAL
  Filled 2016-08-23 (×10): qty 1

## 2016-08-23 MED ORDER — GABAPENTIN 300 MG PO CAPS
900.0000 mg | ORAL_CAPSULE | Freq: Every day | ORAL | Status: DC
  Administered 2016-08-24 – 2016-09-01 (×10): 900 mg via ORAL
  Filled 2016-08-23 (×10): qty 3

## 2016-08-23 MED ORDER — HYDROCOD POLST-CPM POLST ER 10-8 MG/5ML PO SUER
5.0000 mL | Freq: Two times a day (BID) | ORAL | Status: DC | PRN
  Administered 2016-08-24 – 2016-09-01 (×12): 5 mL via ORAL
  Filled 2016-08-23 (×13): qty 5

## 2016-08-23 MED ORDER — DEXTROSE 50 % IV SOLN
50.0000 mL | Freq: Once | INTRAVENOUS | Status: AC
  Administered 2016-08-23: 50 mL via INTRAVENOUS

## 2016-08-23 MED ORDER — PREDNISONE 20 MG PO TABS
40.0000 mg | ORAL_TABLET | Freq: Every day | ORAL | Status: DC
  Administered 2016-08-24 – 2016-08-26 (×3): 40 mg via ORAL
  Filled 2016-08-23 (×3): qty 2

## 2016-08-23 MED ORDER — SIMVASTATIN 20 MG PO TABS
20.0000 mg | ORAL_TABLET | Freq: Every day | ORAL | Status: DC
  Administered 2016-08-24 – 2016-09-01 (×10): 20 mg via ORAL
  Filled 2016-08-23 (×10): qty 1

## 2016-08-23 NOTE — ED Triage Notes (Signed)
PER EMS: pt here from home with c/o SOB and unproductive cough. Pt was recently d/c from [facility] two days ago, was admitted for sepsis, resp distress and influenza A. Pt was told to wear 1L [place] at home and he was satting 88% on 1LC and EMS increased it to 4L [place] and O2 increased to 94%. Pt stated his chest feels discomfort only when coughing 'i feel like I cant cough it all up." BP-122/78, HR-94 irregular, RR-18. CBG 95.

## 2016-08-23 NOTE — ED Notes (Signed)
Dr. Effie ShyWentz informed of pts lactic acid of 2.89

## 2016-08-23 NOTE — ED Notes (Signed)
Admitting @ bedside

## 2016-08-23 NOTE — ED Notes (Signed)
Pt transported to xray at this time

## 2016-08-23 NOTE — ED Notes (Signed)
ED Provider at bedside. 

## 2016-08-23 NOTE — H&P (Signed)
Date: 08/24/2016               Patient Name:  Wayne Benson MRN: [id number]  DOB: 1955-08-22 Age / Sex: 61 y.o., male   PCP: [facility]         Medical Service: Internal Medicine Teaching Service         Attending Physician: Dr. Mancel Bale, MD    First Contact: Dr. Vincente Liberty Pager: [phone]  Second Contact: Dr. Dimple Casey Pager: [phone]       After Hours (After 5p/  First Contact Pager: [phone]  weekends / holidays): Second Contact Pager: [phone]   Chief Complaint: difficulty breathing   History of Present Illness:  61 year old man presents from home with difficulty breathing and non productive cough. 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 He was recently admitted 4/17- 5/2 for flu pneumonia and DKA and discharged on 1 L [place] and a prednisone taper.  When he arrived home his oxygen tanks were at him home but he describes not using those tanks but instead using his nasal canula hooked to a humidifier and was under the impression that the oxygen was for use when he left the house. His prednisone prescription was not picked up until yesterday evening so he went a day without taking this rx. Since discharge he has been predominantly laying on the couch and experienced DOE when he did anything more strenuous than that. He also notes that his air conditioning broke today and that made his breathing significantly worse. He also notes that he took extra insulin at lunch - 100 units instead of his prescribed 50 units and does not check his blood sugar before taking insulin. He does admit that he has a hard time understanding medical instructions and sticking to those instructions.  When per EMS reports, when they arrived he has SpO2 88% which improved to 97% on 4 L, RR 18, irregular HR 94 bpm, BP 122/78 and CBG 95.   Meds:  Current Meds  Medication Sig  . acetaminophen  (TYLENOL) 500 MG tablet Take 1,500 mg by mouth every 6 (six) hours as needed for mild pain.  . chlorpheniramine-HYDROcodone (TUSSIONEX) 10-8 MG/5ML SUER Take 5 mLs by mouth every 12 (twelve) hours as needed for cough.  . feeding supplement, GLUCERNA SHAKE, (GLUCERNA SHAKE) LIQD Take 237 mLs by mouth 3 (three) times daily between meals.  . gabapentin (NEURONTIN) 300 MG capsule Take 900 mg by mouth at bedtime.  . insulin regular human CONCENTRATED (HUMULIN R) 500 UNIT/ML injection Inject 50-100 Units into the skin See admin instructions. 100 units in am, 50 units in afternoon, 100 units in pm  . predniSONE (DELTASONE) 20 MG tablet Take 2 tablets (40 mg total) by mouth daily with breakfast. For 4 days starting 08/22/16. Then take 1 tablet daily with breakfast for 5 days. Then take 1/2 tablet daily with breakfast for 5 days.  Marland Kitchen zolpidem (AMBIEN) 5 MG tablet Take 5 mg by mouth at bedtime as needed for sleep.     Allergies: Allergies as of 08/23/2016 - Review Complete 08/23/2016  Allergen Reaction Noted  . Duloxetine hcl Other (See Comments) 07/29/2014   Past Medical History:  Diagnosis Date  . Anemia   . Anxiety   . Bipolar disorder (HCC)   . Bronchial asthma    "get it just about q yr" (  08/06/2016)  . Depression   . Diabetes mellitus without complication (HCC)   . DVT (deep vein thrombosis) in pregnancy (HCC)    RLE  . Esophageal varices (HCC)   . GERD (gastroesophageal reflux disease)   . History of blood transfusion    "related to cirrhosis"  . Hypercholesteremia   . Liver cirrhosis secondary to NASH (HCC)    hx/notes 08/06/2016  . Non-alcoholic cirrhosis (HCC)   . Peripheral neuropathy   . Schizophrenia (HCC)   . Thrombocytopenia (HCC)     Family History: Mother IBS, heart failure  Social History: Has been smoking for the past 8 month but quit smoking this morning.  Denies alcohol or illicit drug use.   Review of Systems: A complete ROS was negative except as per HPI.    Physical Exam: Blood pressure 118/73, pulse 97, temperature 98.9 F (37.2 C), temperature source Oral, resp. rate (!) 21, height 5\' 10"  (1.778 m), weight 177 lb (80.3 kg), SpO2 93 %. Physical Exam  Constitutional: He is oriented to person, place, and time and well-developed, well-nourished, and in no distress. No distress.  HENT:  Head: Normocephalic and atraumatic.  Cardiovascular: Normal rate and regular rhythm.   No murmur heard. Pulmonary/Chest: Effort normal. No respiratory distress. He has no wheezes. He has no rales.  Abdominal: Soft. Bowel sounds are normal. He exhibits distension. There is no tenderness. There is no guarding.  Fluid wave   Neurological: He is alert and oriented to person, place, and time.  Skin: Skin is warm and dry. He is not diaphoretic.   EKG: Sinus tachycardia with PVCs  CXR: Right sided pleural effusions with bilateral opacities  Assessment & Plan by Problem: Principal Problem:   Hypoglycemia Active Problems:   Tobacco abuse   Liver cirrhosis secondary to NASH (HCC)   Thrombocytopenia (HCC)   ILD (interstitial lung disease) (HCC)  Acute respiratory distress with hypoxia  Interstitial lung disease  Reports improvement in his respiratory distress after being back on nasal canula and in air conditioning. It is unclear how he was using his home oxygen but I believe that he may not have been using it as prescribed at home and this may have caused his desaturation.  - continue nasal canula to keep SpO2 < 88 %   - continue prednisone taper  -Tussionex   Liver cirrhosis secondary to NASH  Esophogeal varices  Reports no regent GI bleeding.  - Continue nadolol 20 mg daily  - SCDs for DVT ppx   Insulin dependent diabetes mellitus with peripheral neuropathy  Will need better education on glucometer use and administering insulin to prevent further hypoglycemia.  - ordered regular diet to better assess the glycemic control that he will need at  discharge - ordered ISS  -Gabapentin 900 mg dialy   Thrombocytopenia  May be 2/2 cirrhosis, no signs of active bleeding.  - Continue to monitor    Dispo: Admit patient to Observation with expected length of stay less than 2 midnights.  Signed: Eulah PontBlum, Jnyah Brazee, MD 08/24/2016, 12:13 AM  Pager: [phone]

## 2016-08-23 NOTE — ED Provider Notes (Signed)
[facility]-EMERGENCY DEPT Provider Note   CSN: [id number] Arrival date & time: 08/23/16  1835     History   Chief Complaint Chief Complaint  Patient presents with  . Shortness of Breath    HPI Wayne Benson is a 61 y.o. male.  Patient presents for evaluation of shortness of breath, found today by EMS, with oxygen saturation 88%.  He was on oxygen at 1 L, with a very long oxygen tube, about 80 feet.  Today the patient's wife gave him 100 units of insulin this morning, and 100 units around lunchtime.  On arrival the patient states he is very hungry.  He also states that he has no air conditioning at home and this has made his breathing worse.  He denies coughing or wheezing.  He states he is going to "throw the cigarettes in the trash can".  He denies nausea, vomiting, dysuria or constipation.  He states he is taking his medications as prescribed.  There are no other known modifying factors.  HPI  Past Medical History:  Diagnosis Date  . Anemia   . Anxiety   . Bipolar disorder (HCC)   . Bronchial asthma    "get it just about q yr" (08/06/2016)  . Depression   . Diabetes mellitus without complication (HCC)   . DVT (deep vein thrombosis) in pregnancy (HCC)    RLE  . Esophageal varices (HCC)   . GERD (gastroesophageal reflux disease)   . History of blood transfusion    "related to cirrhosis"  . Hypercholesteremia   . Liver cirrhosis secondary to NASH (HCC)    hx/notes 08/06/2016  . Non-alcoholic cirrhosis (HCC)   . Peripheral neuropathy   . Schizophrenia (HCC)   . Thrombocytopenia [facility])     Patient Active Problem List   Diagnosis Date Noted  . Acute hypoxemic respiratory failure (HCC)   . ILD (interstitial lung disease) (HCC)   . ARDS (adult respiratory distress syndrome) (HCC)   . Hypoxemia   . Respiratory failure (HCC)   . Influenza A 08/06/2016  . Thrombocytopenia (HCC) 08/06/2016  . Diabetes (HCC) 01/20/2016  . Esophageal varices in cirrhosis (HCC) 01/20/2016  . Tobacco  abuse 01/20/2016  . GERD (gastroesophageal reflux disease) 01/20/2016  . Liver cirrhosis secondary to NASH (HCC) 01/20/2016    Past Surgical History:  Procedure Laterality Date  . CARDIAC CATHETERIZATION     "@ the [place]":  Marland Kitchen CATARACT EXTRACTION W/ INTRAOCULAR LENS IMPLANT Right   . ESOPHAGOGASTRODUODENOSCOPY (EGD) WITH PROPOFOL Left 01/21/2016   Procedure: ESOPHAGOGASTRODUODENOSCOPY (EGD) WITH PROPOFOL;  Surgeon: Willis Modena, MD;  Location[facility] ENDOSCOPY;  Service: Endoscopy;  Laterality: Left;  . FINGER SURGERY Right 1990s?   "thumb; sliced it w/a blade"  . IVC FILTER PLACEMENT ([facility] HX)    . US GUIDED PARACENTESIS ([facility] HX)         Home Medications    Prior to Admission medications   Medication Sig Start Date End Date Taking? Authorizing Provider  acetaminophen (TYLENOL) 500 MG tablet Take 1,500 mg by mouth every 6 (six) hours as needed for mild pain.   Yes [provider]  chlorpheniramine-HYDROcodone (TUSSIONEX) 10-8 MG/5ML SUER Take 5 mLs by mouth every 12 (twelve) hours as needed for cough. 08/21/16  Yes Molt, Bethany, DO  feeding supplement, GLUCERNA SHAKE, (GLUCERNA SHAKE) LIQD Take 237 mLs by mouth 3 (three) times daily between meals. 08/21/16  Yes Molt, Bethany, DO  gabapentin (NEURONTIN) 300 MG capsule Take 900 mg by mouth at bedtime.  Yes [provider]  insulin regular human CONCENTRATED (HUMULIN R) 500 UNIT/ML injection Inject 50-100 Units into the skin See admin instructions. 100 units in am, 50 units in afternoon, 100 units in pm   Yes [provider]  predniSONE (DELTASONE) 20 MG tablet Take 2 tablets (40 mg total) by mouth daily with breakfast. For 4 days starting 08/22/16. Then take 1 tablet daily with breakfast for 5 days. Then take 1/2 tablet daily with breakfast for 5 days. 08/22/16  Yes Molt, Bethany, DO  zolpidem (AMBIEN) 5 MG tablet Take 5 mg by mouth at bedtime as needed for sleep.   Yes [provider]  cetirizine (ZYRTEC) 10 MG  tablet Take 10 mg by mouth daily.    [provider]  cyanocobalamin 500 MCG tablet Take 500 mcg by mouth daily.    [provider]  ferrous sulfate 325 (65 FE) MG tablet Take 325 mg by mouth daily with breakfast.    [provider]  folic acid (FOLVITE) 1 MG tablet Take 1 mg by mouth daily.    [provider]  nadolol (CORGARD) 20 MG tablet Take 20 mg by mouth daily.    [provider]  ondansetron (ZOFRAN) 8 MG tablet Take 8 mg by mouth every 8 (eight) hours as needed for nausea or vomiting.    [provider]  pantoprazole (PROTONIX) 40 MG tablet Take 40 mg by mouth 2 (two) times daily.    [provider]  simvastatin (ZOCOR) 20 MG tablet Take 20 mg by mouth at bedtime.    [provider]    Family History No family history on file.  Social History Social History  Substance Use Topics  . Smoking status: Current Every Day Smoker    Packs/day: 0.25    Years: 5.00    Types: Cigarettes  . Smokeless tobacco: Never Used  . Alcohol use No     Allergies   Duloxetine hcl   Review of Systems Review of Systems  All other systems reviewed and are negative.    Physical Exam Updated Vital Signs BP 117/72   Pulse (!) 53   Temp 98.9 F (37.2 C) (Oral)   Resp (!) 21   Ht 5\' 10"  (1.778 m)   Wt 177 lb (80.3 kg)   SpO2 95%   BMI 25.40 kg/m   Physical Exam  Constitutional: He is oriented to person, place, and time. He appears well-developed and well-nourished. He appears distressed (He is uncomfortable.).  Appears much older than stated age.  HENT:  Head: Normocephalic and atraumatic.  Right Ear: External ear normal.  Left Ear: External ear normal.  Dry oral mucous membranes.  Eyes: Conjunctivae and EOM are normal. Pupils are equal, round, and reactive to light.  Neck: Normal range of motion and phonation normal. Neck supple.  Cardiovascular: Normal rate, regular rhythm and normal heart sounds.     Pulmonary/Chest: Effort normal and breath sounds normal. He exhibits no bony tenderness.  Abdominal: Soft. There is no tenderness.  Musculoskeletal: Normal range of motion.  Neurological: He is alert and oriented to person, place, and time. No cranial nerve deficit or sensory deficit. He exhibits normal muscle tone. Coordination normal.  No dysarthria, aphasia or nystagmus.  Skin: Skin is warm, dry and intact.  Psychiatric: He has a normal mood and affect. His behavior is normal. Judgment and thought content normal.  Nursing note and vitals reviewed.    ED Treatments / Results  Labs (all labs ordered  are listed, but only abnormal results are displayed) Labs Reviewed  CBC WITH DIFFERENTIAL/PLATELET - Abnormal; Notable for the following:       Result Value   Platelets 60 (*)    All other components within normal limits  COMPREHENSIVE METABOLIC PANEL - Abnormal; Notable for the following:    Creatinine, Ser 0.58 (*)    Calcium 8.5 (*)    Total Protein 5.3 (*)    Albumin 2.3 (*)    AST 100 (*)    ALT 102 (*)    All other components within normal limits  I-STAT CG4 LACTIC ACID, ED - Abnormal; Notable for the following:    Lactic Acid, Venous 2.89 (*)    All other components within normal limits  CBG MONITORING, ED - Abnormal; Notable for the following:    Glucose-Capillary 47 (*)    All other components within normal limits  CBG MONITORING, ED - Abnormal; Notable for the following:    Glucose-Capillary 118 (*)    All other components within normal limits    EKG  EKG Interpretation  Date/Time:  Friday Aug 23 2016 18:43:52 EDT Ventricular Rate:  106 PR Interval:    QRS Duration: 94 QT Interval:  350 QTC Calculation: 465 R Axis:   31 Text Interpretation:  Sinus tachycardia Multiform ventricular premature complexes Since last tracing rate faster Confirmed by Mancel Bale [phone]) on 08/23/2016 7:47:29 PM       Radiology Dg Chest 2 View  Result Date: 08/23/2016 CLINICAL  DATA:  61 year old male with shortness of breath, fever and dry cough. EXAM: CHEST  2 VIEW COMPARISON:  Chest x-ray 08/19/2016. FINDINGS: The patchy multifocal peripheral predominant opacities are again noted throughout the lungs bilaterally very similar to the prior study. Diffuse peribronchial cuffing. New small bilateral pleural effusions. No evidence of pulmonary edema. Heart size is normal. Upper mediastinal contours are within normal limits. IMPRESSION: 1. New small bilateral pleural effusions. 2. Chronic changes in the lungs suggestive of interstitial lung disease redemonstrated. This could be better evaluated with nonemergent high-resolution chest CT after resolution of the patient's acute illness. Electronically Signed   By: Trudie Reed M.D.   On: 08/23/2016 19:22    Procedures Procedures (including critical care time)  Medications Ordered in ED Medications  dextrose 50 % solution 50 mL (50 mLs Intravenous Given 08/23/16 1956)     Initial Impression / Assessment and Plan / ED Course  I have reviewed the triage vital signs and the nursing notes.  Pertinent labs & imaging results that were available during my care of the patient were reviewed by me and considered in my medical decision making (see chart for details).  Clinical Course as of Aug 23 2221  Fri Aug 23, 2016  1950 At this time the patient states he is very hungry, and he wanted to stop talking so he could get something to eat.  [EW]  1954 Elevated.  Last lactate, 08/07/16, 2.3.  Indicating mild change.  Note that the patient has chronic liver disease. Lactic Acid, Venous: (!!) 2.89 [EW]    Clinical Course User Index [EW] Mancel Bale, MD    Medications  dextrose 50 % solution 50 mL (50 mLs Intravenous Given 08/23/16 1956)    Patient Vitals for the past 24 hrs:  BP Temp Temp src Pulse Resp SpO2 Height Weight  08/23/16 2130 117/72 - - (!) 53 (!) 21 95 % - -  08/23/16 2115 117/82 - - 98 (!) 24 93 % - -  08/23/16  2030 107/63 - - 98 (!) 30 93 % - -  08/23/16 1945 (!) 171/78 - - 100 (!) 33 92 % - -  08/23/16 1930 130/69 - - (!) 107 (!) 33 93 % - -  08/23/16 1900 105/70 - - - (!) 26 - - -  08/23/16 1846 129/77 98.9 F (37.2 C) Oral (!) 111 (!) 22 93 % - -  08/23/16 1845 121/81 - - (!) 109 - 91 % - -  08/23/16 1841 - - - - - - 5\' 10"  (1.778 m) 177 lb (80.3 kg)   22: 15-consult with internal medicine teaching service, they will evaluate the patient for admission.   10:23 PM Reevaluation with update and discussion. After initial assessment and treatment, an updated evaluation reveals patient remains alert.  CBG improved after treatment with D50 and the patient is eating well.  Patient agrees with admission all questions answered. Kemyah Buser L    Final Clinical Impressions(s) / ED Diagnoses   Final diagnoses:  Hypoglycemia  Acute respiratory failure with hypoxia (HCC)  Interstitial lung disease (HCC)    Hypoxia related to interstitial lung disease, and likely inappropriate oxygen supplementation at home.  Hypoglycemia related to high-dose insulin dosing.  In review of her chart it appears that the patient was supposed to be discharged on a lower dose of insulin, but not only do that not happen, he got excessive insulin, by mistake at noon time today.  There seems to be a knowledge deficit regarding appropriate treatment for diabetes, and the patient and his wife will likely need extensive education, close monitoring by home health, prior to being safely discharged.  Nursing Notes Reviewed/ Care Coordinated Applicable Imaging Reviewed Interpretation of Laboratory Data incorporated into ED treatment   Plan: Admit  New Prescriptions New Prescriptions   No medications on file     Mancel BaleWentz, Hennie Gosa, MD 08/23/16 2224

## 2016-08-24 DIAGNOSIS — I8510 Secondary esophageal varices without bleeding: Secondary | ICD-10-CM | POA: Diagnosis not present

## 2016-08-24 DIAGNOSIS — E1142 Type 2 diabetes mellitus with diabetic polyneuropathy: Secondary | ICD-10-CM | POA: Diagnosis not present

## 2016-08-24 DIAGNOSIS — J849 Interstitial pulmonary disease, unspecified: Secondary | ICD-10-CM | POA: Diagnosis not present

## 2016-08-24 DIAGNOSIS — Z794 Long term (current) use of insulin: Secondary | ICD-10-CM | POA: Diagnosis not present

## 2016-08-24 DIAGNOSIS — J9601 Acute respiratory failure with hypoxia: Secondary | ICD-10-CM | POA: Diagnosis present

## 2016-08-24 DIAGNOSIS — K7581 Nonalcoholic steatohepatitis (NASH): Secondary | ICD-10-CM | POA: Diagnosis not present

## 2016-08-24 DIAGNOSIS — E11649 Type 2 diabetes mellitus with hypoglycemia without coma: Secondary | ICD-10-CM | POA: Diagnosis not present

## 2016-08-24 DIAGNOSIS — D696 Thrombocytopenia, unspecified: Secondary | ICD-10-CM | POA: Diagnosis not present

## 2016-08-24 DIAGNOSIS — Z9981 Dependence on supplemental oxygen: Secondary | ICD-10-CM | POA: Diagnosis not present

## 2016-08-24 DIAGNOSIS — K7469 Other cirrhosis of liver: Secondary | ICD-10-CM | POA: Diagnosis not present

## 2016-08-24 DIAGNOSIS — Z888 Allergy status to other drugs, medicaments and biological substances status: Secondary | ICD-10-CM | POA: Diagnosis not present

## 2016-08-24 DIAGNOSIS — J9621 Acute and chronic respiratory failure with hypoxia: Secondary | ICD-10-CM | POA: Diagnosis not present

## 2016-08-24 DIAGNOSIS — F17200 Nicotine dependence, unspecified, uncomplicated: Secondary | ICD-10-CM | POA: Diagnosis not present

## 2016-08-24 LAB — BASIC METABOLIC PANEL
Anion gap: 8 (ref 5–15)
BUN: 16 mg/dL (ref 6–20)
CALCIUM: 8.5 mg/dL — AB (ref 8.9–10.3)
CO2: 26 mmol/L (ref 22–32)
CREATININE: 0.65 mg/dL (ref 0.61–1.24)
Chloride: 106 mmol/L (ref 101–111)
GFR calc non Af Amer: >60 mL/min (ref >60)
Glucose, Bld: 221 mg/dL — ABNORMAL HIGH (ref 65–99)
Potassium: 4 mmol/L (ref 3.5–5.1)
SODIUM: 140 mmol/L (ref 135–145)

## 2016-08-24 LAB — GLUCOSE, CAPILLARY
GLUCOSE-CAPILLARY: 252 mg/dL — AB (ref 65–99)
GLUCOSE-CAPILLARY: 277 mg/dL — AB (ref 65–99)
Glucose-Capillary: 249 mg/dL — ABNORMAL HIGH (ref 65–99)
Glucose-Capillary: 283 mg/dL — ABNORMAL HIGH (ref 65–99)

## 2016-08-24 LAB — CBC
HCT: 38.7 % — ABNORMAL LOW (ref 39.0–52.0)
Hemoglobin: 12.7 g/dL — ABNORMAL LOW (ref 13.0–17.0)
MCH: 30.5 pg (ref 26.0–34.0)
MCHC: 32.8 g/dL (ref 30.0–36.0)
MCV: 93 fL (ref 78.0–100.0)
PLATELETS: 48 10*3/uL — AB (ref 150–400)
RBC: 4.16 MIL/uL — AB (ref 4.22–5.81)
RDW: 15.3 % (ref 11.5–15.5)
WBC: 8.5 10*3/uL (ref 4.0–10.5)

## 2016-08-24 LAB — HEPATIC FUNCTION PANEL
ALT: 93 U/L — AB (ref 17–63)
AST: 87 U/L — ABNORMAL HIGH (ref 15–41)
Albumin: 2.2 g/dL — ABNORMAL LOW (ref 3.5–5.0)
Alkaline Phosphatase: 107 U/L (ref 38–126)
BILIRUBIN INDIRECT: 0.9 mg/dL (ref 0.3–0.9)
Bilirubin, Direct: 0.4 mg/dL (ref 0.1–0.5)
Total Bilirubin: 1.3 mg/dL — ABNORMAL HIGH (ref 0.3–1.2)
Total Protein: 5.4 g/dL — ABNORMAL LOW (ref 6.5–8.1)

## 2016-08-24 LAB — CBG MONITORING, ED: GLUCOSE-CAPILLARY: 95 mg/dL (ref 65–99)

## 2016-08-24 MED ORDER — INSULIN REGULAR HUMAN (CONC) 500 UNIT/ML ~~LOC~~ SOPN
65.0000 [IU] | PEN_INJECTOR | Freq: Two times a day (BID) | SUBCUTANEOUS | Status: DC
  Administered 2016-08-24 – 2016-08-28 (×9): 65 [IU] via SUBCUTANEOUS
  Filled 2016-08-24: qty 3

## 2016-08-24 MED ORDER — ACETAMINOPHEN 325 MG PO TABS
650.0000 mg | ORAL_TABLET | Freq: Four times a day (QID) | ORAL | Status: DC | PRN
  Administered 2016-08-24 – 2016-09-02 (×10): 650 mg via ORAL
  Filled 2016-08-24 (×10): qty 2

## 2016-08-24 MED ORDER — INSULIN REGULAR HUMAN (CONC) 500 UNIT/ML ~~LOC~~ SOPN
35.0000 [IU] | PEN_INJECTOR | Freq: Every day | SUBCUTANEOUS | Status: DC
  Administered 2016-08-24 – 2016-08-27 (×3): 35 [IU] via SUBCUTANEOUS

## 2016-08-24 MED ORDER — INSULIN ASPART 100 UNIT/ML ~~LOC~~ SOLN
0.0000 [IU] | Freq: Three times a day (TID) | SUBCUTANEOUS | Status: DC
  Administered 2016-08-24: 3 [IU] via SUBCUTANEOUS
  Administered 2016-08-24 – 2016-08-25 (×3): 5 [IU] via SUBCUTANEOUS
  Administered 2016-08-25 – 2016-08-26 (×2): 3 [IU] via SUBCUTANEOUS
  Administered 2016-08-26 (×2): 2 [IU] via SUBCUTANEOUS
  Administered 2016-08-27: 3 [IU] via SUBCUTANEOUS
  Administered 2016-08-27: 5 [IU] via SUBCUTANEOUS
  Administered 2016-08-28: 2 [IU] via SUBCUTANEOUS
  Administered 2016-08-28: 5 [IU] via SUBCUTANEOUS
  Administered 2016-08-28: 2 [IU] via SUBCUTANEOUS
  Administered 2016-08-29: 7 [IU] via SUBCUTANEOUS
  Administered 2016-08-29: 5 [IU] via SUBCUTANEOUS

## 2016-08-24 MED ORDER — ACETAMINOPHEN 650 MG RE SUPP: 650.0000 mg | Freq: Four times a day (QID) | RECTAL | Status: DC | PRN

## 2016-08-24 MED ORDER — GLUCERNA SHAKE PO LIQD
237.0000 mL | Freq: Three times a day (TID) | ORAL | Status: DC
  Administered 2016-08-24 – 2016-09-02 (×23): 237 mL via ORAL

## 2016-08-24 MED ORDER — SENNOSIDES-DOCUSATE SODIUM 8.6-50 MG PO TABS: 1.0000 | ORAL_TABLET | Freq: Every evening | ORAL | Status: DC | PRN

## 2016-08-24 NOTE — Progress Notes (Signed)
Pt. transferred from ER via stretcher to 6E- 13; alert and oriented x4; oriented pt. to room and call button; and hospital policies- valuables; pt. connected to tele box 13 for O2 sat monitoring. No distress noted, and pt. states he's breathing good now; states air condition at home went out.

## 2016-08-24 NOTE — Plan of Care (Signed)
Problem: Education: Goal: Knowledge of [facility] Education information/materials will improve Outcome: Progressing POC reviewed with pt.   

## 2016-08-24 NOTE — Progress Notes (Signed)
   Subjective: [name] feels much improved today ever since arrival and placed on supplemental oxygen. He feels very fatigued with dyspnea on exertion but fine at rest. He is not coughing much and denies any fever or chills. He has a good appetite.  Objective:  Vital signs in last 24 hours: Vitals:   08/24/16 0115 08/24/16 0558 08/24/16 1028 08/24/16 1655  BP: 119/71 133/80 124/80 (!) 112/58  Pulse: 92 100 95 88  Resp: 20 (!) 22 20 19   Temp: 98.5 F (36.9 C) 98.3 F (36.8 C) 97.4 F (36.3 C) 97.4 F (36.3 C)  TempSrc: Oral Oral Oral Oral  SpO2: 92% 93% 93% 95%  Weight: 185 lb (83.9 kg)     Height: 5\' 10"  (1.778 m)      GENERAL- alert, co-operative, NAD HEENT- No lymphadenopathy, no JVD CARDIAC- RRR, no murmurs, rubs or gallops RESP- CTAB, slightly dyspneic with conversation on 2L O2, normal WOB EXTREMITIES- Symmetric, no pedal edema SKIN- Warm, dry, No rash or lesion PSYCH- Normal mood and affect, appropriate thought content and speech   Assessment/Plan: #Acute respiratory distress with hypoxia  #Interstitial lung disease  He is quickly improved since arrival to the hospital. His decompensation at home is more likely from a poorly controlled climate and improper medical device setup. We will continue supplemental oxygen for SpO2 > 88% and continue his oral prednisone at 40mg . He will need additional education and probably some form of nursing or therapist home visit to confirm adequate support and set up before he can return home safely.  #Insulin dependent diabetes mellitus with peripheral neuropathy  He became hypoglycemic after repeat excessive doses at home. He and his wife need more education about diabetes management. He reports intermittent compliance on his previous home dose of 100U/50U/100U at breakfast/lunch/dinner so probably when all taken this is too high a daily dose. He is on lower dose at this time although CBGs are elevated. This is also complicated by his  daily prednisone for the next few days. We will ask our diabetes coordinator to offer some more instruction about taking insulin on a regular schedule.  #Thrombocytopenia  Likely related to liver cirrhosis. He is on SCDs only for VTE ppx.  Dispo: Anticipated discharge in approximately 1-2 day(s).   Wayne Planhristopher W Britten Parady, MD PGY-II Internal Medicine Resident Pager# [phone] 08/24/2016, 5:20 PM

## 2016-08-25 DIAGNOSIS — E1165 Type 2 diabetes mellitus with hyperglycemia: Secondary | ICD-10-CM | POA: Diagnosis present

## 2016-08-25 DIAGNOSIS — Z86718 Personal history of other venous thrombosis and embolism: Secondary | ICD-10-CM | POA: Diagnosis not present

## 2016-08-25 DIAGNOSIS — K7581 Nonalcoholic steatohepatitis (NASH): Secondary | ICD-10-CM | POA: Diagnosis present

## 2016-08-25 DIAGNOSIS — D6959 Other secondary thrombocytopenia: Secondary | ICD-10-CM | POA: Diagnosis present

## 2016-08-25 DIAGNOSIS — Z9981 Dependence on supplemental oxygen: Secondary | ICD-10-CM | POA: Diagnosis not present

## 2016-08-25 DIAGNOSIS — J9601 Acute respiratory failure with hypoxia: Secondary | ICD-10-CM

## 2016-08-25 DIAGNOSIS — T380X5A Adverse effect of glucocorticoids and synthetic analogues, initial encounter: Secondary | ICD-10-CM | POA: Diagnosis present

## 2016-08-25 DIAGNOSIS — F209 Schizophrenia, unspecified: Secondary | ICD-10-CM | POA: Diagnosis present

## 2016-08-25 DIAGNOSIS — K7460 Unspecified cirrhosis of liver: Secondary | ICD-10-CM

## 2016-08-25 DIAGNOSIS — Z7952 Long term (current) use of systemic steroids: Secondary | ICD-10-CM | POA: Diagnosis not present

## 2016-08-25 DIAGNOSIS — E1142 Type 2 diabetes mellitus with diabetic polyneuropathy: Secondary | ICD-10-CM | POA: Diagnosis present

## 2016-08-25 DIAGNOSIS — F419 Anxiety disorder, unspecified: Secondary | ICD-10-CM | POA: Diagnosis present

## 2016-08-25 DIAGNOSIS — Z794 Long term (current) use of insulin: Secondary | ICD-10-CM | POA: Diagnosis not present

## 2016-08-25 DIAGNOSIS — Z9189 Other specified personal risk factors, not elsewhere classified: Secondary | ICD-10-CM | POA: Diagnosis not present

## 2016-08-25 DIAGNOSIS — J849 Interstitial pulmonary disease, unspecified: Secondary | ICD-10-CM | POA: Diagnosis present

## 2016-08-25 DIAGNOSIS — F17210 Nicotine dependence, cigarettes, uncomplicated: Secondary | ICD-10-CM | POA: Diagnosis present

## 2016-08-25 DIAGNOSIS — D696 Thrombocytopenia, unspecified: Secondary | ICD-10-CM | POA: Diagnosis not present

## 2016-08-25 DIAGNOSIS — F319 Bipolar disorder, unspecified: Secondary | ICD-10-CM | POA: Diagnosis present

## 2016-08-25 DIAGNOSIS — Z888 Allergy status to other drugs, medicaments and biological substances status: Secondary | ICD-10-CM | POA: Diagnosis not present

## 2016-08-25 DIAGNOSIS — F17200 Nicotine dependence, unspecified, uncomplicated: Secondary | ICD-10-CM | POA: Diagnosis not present

## 2016-08-25 DIAGNOSIS — J9811 Atelectasis: Secondary | ICD-10-CM | POA: Diagnosis not present

## 2016-08-25 DIAGNOSIS — J9621 Acute and chronic respiratory failure with hypoxia: Secondary | ICD-10-CM | POA: Diagnosis present

## 2016-08-25 DIAGNOSIS — I8510 Secondary esophageal varices without bleeding: Secondary | ICD-10-CM | POA: Diagnosis present

## 2016-08-25 DIAGNOSIS — E11649 Type 2 diabetes mellitus with hypoglycemia without coma: Secondary | ICD-10-CM | POA: Diagnosis present

## 2016-08-25 DIAGNOSIS — K219 Gastro-esophageal reflux disease without esophagitis: Secondary | ICD-10-CM | POA: Diagnosis present

## 2016-08-25 DIAGNOSIS — Z79899 Other long term (current) drug therapy: Secondary | ICD-10-CM | POA: Diagnosis not present

## 2016-08-25 DIAGNOSIS — R188 Other ascites: Secondary | ICD-10-CM | POA: Diagnosis present

## 2016-08-25 LAB — BASIC METABOLIC PANEL
Anion gap: 7 (ref 5–15)
BUN: 15 mg/dL (ref 6–20)
CO2: 25 mmol/L (ref 22–32)
Calcium: 8.5 mg/dL — ABNORMAL LOW (ref 8.9–10.3)
Chloride: 107 mmol/L (ref 101–111)
Creatinine, Ser: 0.58 mg/dL — ABNORMAL LOW (ref 0.61–1.24)
GFR calc Af Amer: >60 mL/min (ref >60)
GFR calc non Af Amer: >60 mL/min (ref >60)
Glucose, Bld: 153 mg/dL — ABNORMAL HIGH (ref 65–99)
Potassium: 3.9 mmol/L (ref 3.5–5.1)
SODIUM: 139 mmol/L (ref 135–145)

## 2016-08-25 LAB — CBC
HEMATOCRIT: 37.7 % — AB (ref 39.0–52.0)
HEMOGLOBIN: 12.4 g/dL — AB (ref 13.0–17.0)
MCH: 30.9 pg (ref 26.0–34.0)
MCHC: 32.9 g/dL (ref 30.0–36.0)
MCV: 94 fL (ref 78.0–100.0)
Platelets: 48 10*3/uL — ABNORMAL LOW (ref 150–400)
RBC: 4.01 MIL/uL — ABNORMAL LOW (ref 4.22–5.81)
RDW: 15.3 % (ref 11.5–15.5)
WBC: 9.1 10*3/uL (ref 4.0–10.5)

## 2016-08-25 LAB — GLUCOSE, CAPILLARY
GLUCOSE-CAPILLARY: 243 mg/dL — AB (ref 65–99)
Glucose-Capillary: 218 mg/dL — ABNORMAL HIGH (ref 65–99)
Glucose-Capillary: 209 mg/dL — ABNORMAL HIGH (ref 65–99)
Glucose-Capillary: 189 mg/dL — ABNORMAL HIGH (ref 65–99)

## 2016-08-25 MED ORDER — LIVING WELL WITH DIABETES BOOK
Freq: Once | Status: AC
  Administered 2016-08-25: 09:00:00
  Filled 2016-08-25: qty 1

## 2016-08-25 MED ORDER — ORAL CARE MOUTH RINSE
15.0000 mL | Freq: Two times a day (BID) | OROMUCOSAL | Status: DC
  Administered 2016-08-25 – 2016-08-27 (×4): 15 mL via OROMUCOSAL

## 2016-08-25 NOTE — Plan of Care (Signed)
Problem: Physical Regulation: Goal: Complications related to the disease process, condition or treatment will be avoided or minimized Outcome: Progressing Patient up to bathroom. Became very short of breath and called out for help. Encouraged patient to use pursed lip breathing. Patient recovered within 2 minutes. Patient educated to call for help before getting out of bed. Bed alarm turned on. Will continue to monitor. Bess KindsGWALTNEY, Evalisse Prajapati B, RN

## 2016-08-25 NOTE — Progress Notes (Signed)
   Subjective: [name] is doing well today with no dyspnea at rest although does get very short of breath with exertion walking around the room and to the bathroom. He is participating with nursing staff and watching educational videos on diabetes management today. He notices increased abdominal swelling that he thinks he has gained 10 pounds since returning to the hospital. He is working on having his mobile home air-conditioning repaired because he feels that very hot temperatures or worsening his shortness of breath and exercise intolerance prior to readmission.  Objective:  Vital signs in last 24 hours: Vitals:   08/24/16 1655 08/24/16 2056 08/25/16 0551 08/25/16 0900  BP: (!) 112/58 129/78 126/72 134/68  Pulse: 88 85 80 77  Resp: 19 20 20 20   Temp: 97.4 F (36.3 C) 97.5 F (36.4 C) 98.5 F (36.9 C) 98.2 F (36.8 C)  TempSrc: Oral Oral Oral Oral  SpO2: 95% 90% 90% 94%  Weight:  190 lb 14.4 oz (86.6 kg)    Height:       GENERAL- alert, co-operative, NAD HEENT- No lymphadenopathy, no JVD CARDIAC- RRR, no murmurs, rubs or gallops RESP- Diffuse inspiratory crackles, normal WOB at rest, slight dyspnea with conversation ABDOMEN- Nontender and no obvious fluid wave but more prominent compared to past exam EXTREMITIES- Symmetric, no pedal edema SKIN- Warm, dry, No rash or lesion   Assessment/Plan: #Acute respiratory distress with hypoxia  His appears to be tolerating room air at rest and needing about 3 L for activity in the room but we'll try and get a more accurate assessment today. His decompensation at home is more likely from a poorly controlled climate and improper medical device setup. His home situation is concerning at the moment that he would be unsafe at discharge and likely to quickly worsen. He is living at home with his wife currently but states she is very busy and getting over her own medical problems limiting the amount of assistance available I will also consult  PT/OT for evaluation of his stability and function since he may not have the previously recommended supervision available. I will discuss the case with social work if there are other local resources available to him that might make a safer discharge plan in the meantime.  #Interstitial lung disease  We are continuing his oral prednisone at 40mg  daily for ILD. Based on the original treatment plan he would start titration down with 20 mg tomorrow.  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 We will continue on a total daily scheduled dose of 165 units daily plus a sliding scale right now and his blood sugars are running moderately elevated. This is being exacerbated by his daily prednisone.  #Thrombocytopenia  Down to 48 today, chronically low 2/2 liver cirrhosis. He is on SCDs only for VTE ppx.  #Cirrhosis I suspect some degree of ascites accumulation based on his abdominal swelling. It is nontender and not grossly distended. He may need a resumption of diuretics if his weight increases again tomorrow. He was not taking diuretics regularly prior to his previous admission where he became very volume overloaded .  Dispo: Anticipated discharge to home once environment is safer or to skilled nursing facility for temporary care and rehab.   Wayne Planhristopher W Zay Yeargan, MD PGY-II Internal Medicine Resident Pager# [phone] 08/25/2016, 2:49 PM

## 2016-08-25 NOTE — Plan of Care (Signed)
Problem: Coping: Goal: Ability to adjust to condition or change in health will improve Outcome: Progressing Provided copy of Living Well with Diabetes book. Reviewed contents of what is the book. Discussed importance of monitoring blood sugar at least daily and keeping a log. Reviewed signs and symptoms of high and low blood sugar. Will continue to monitor.

## 2016-08-26 LAB — GLUCOSE, CAPILLARY
GLUCOSE-CAPILLARY: 154 mg/dL — AB (ref 65–99)
GLUCOSE-CAPILLARY: 205 mg/dL — AB (ref 65–99)
GLUCOSE-CAPILLARY: 94 mg/dL (ref 65–99)
Glucose-Capillary: 152 mg/dL — ABNORMAL HIGH (ref 65–99)

## 2016-08-26 MED ORDER — GUAIFENESIN ER 600 MG PO TB12
600.0000 mg | ORAL_TABLET | Freq: Two times a day (BID) | ORAL | Status: DC
  Administered 2016-08-26 – 2016-09-02 (×15): 600 mg via ORAL
  Filled 2016-08-26 (×15): qty 1

## 2016-08-26 MED ORDER — PREDNISONE 20 MG PO TABS
20.0000 mg | ORAL_TABLET | Freq: Every day | ORAL | Status: AC
  Administered 2016-08-27: 20 mg via ORAL
  Filled 2016-08-26: qty 1

## 2016-08-26 NOTE — Progress Notes (Signed)
Physical Therapy  Note  SATURATION QUALIFICATIONS: (This note is used to comply with regulatory documentation for home oxygen)  Patient Saturations on Room Air at Rest = 90%  Patient Saturations on Room Air while Ambulating = 82%  Patient Saturations on 3 Liters of oxygen while Ambulating = 89-92%  Please briefly explain why patient needs home oxygen: Patient requires supplemental oxygen to maintain oxygen saturations at acceptable, safe levels with physical activity.  Full PT Eval note to follow.  Van ClinesHolly Macey Wurtz, [place]PT  Acute Rehabilitation Services Pager [phone] Office [phone]

## 2016-08-26 NOTE — Care Management Note (Signed)
Case Management Note  Patient Details  Name: Wayne Benson MRN: [id number] Date of Birth: 10-22-1955  Subjective/Objective:       Case manager following for progression and d/c planning.             Action/Plan: 08/26/2016 Met with pt re home issues, noted that oxygen problems have been resolved and pt states that he now understands why his oxygen flow was obstructed.  Pt states that he has made contact with a person who will check on this air conditioning today or tomorrow and hopefully make the necessary repair.   Pt with multiple family and social issues. Call placed to [facility] by CSW Crawford Givens , per pt his oxygen and HH are arranged by the [place]. Pt is under the impression that his MD here at the hospital can assist with placing him in an assisted living facility if need. Please be aware that this pt's only payor source is the [place]. Our CSW is in contact with that facility, the hospital does not provide LOG for ALF for any pt regardless to payor source.    Staff working with pt re diabetes teaching and ongoing education.  CM will assist in providing [facility] services, however this too is limited by the [place] approval. Will continue to follow.   Expected Discharge Date:                  Expected Discharge Plan:  [place]  In-House Referral:  Clinical Social Work  Discharge planning Services  CM Consult  Post Acute Care Choice:  Durable Medical Equipment, Home Health Choice offered to:     DME Arranged:    DME Agency:     HH Arranged:    [place] Agency:     Status of Service:  In process, will continue to follow  If discussed at Long Length of Stay Meetings, dates discussed:    Additional Comments:  Adron Bene, RN 08/26/2016, 11:05 AM

## 2016-08-26 NOTE — Evaluation (Signed)
Physical Therapy Evaluation Patient Details Name: Wayne Benson MRN: [id number] DOB: 1955/10/31 Today's Date: 08/26/2016   History of Present Illness  Wayne Benson is a 61 year old man with cirrhosis secondary to NAFLD, esophageal varices, and insulin-dependent type 2 diabetes. He presented to the hospital with chief complaint of dyspnea.  Clinical Impression   Pt admitted with above diagnosis. Pt currently with functional limitations due to the deficits listed below (see PT Problem List). Functional mobility limited by decr functional capacity; See other PT note for details re; supplemental O2 needs;  Pt will benefit from skilled PT to increase their independence and safety with mobility to allow discharge to the venue listed below.       Follow Up Recommendations Home health PT;Supervision/Assistance - 24 hour [name] for chronic disease mgmnt)    Equipment Recommendations  Gilmer Mor (has one)    Recommendations for Other Services OT consult     Precautions / Restrictions Precautions Precautions: Fall;Other (comment) Precaution Comments: Watch O2 sats      Mobility  Bed Mobility Overal bed mobility: Modified Independent                Transfers Overall transfer level: Modified independent Equipment used: None   Sit to Stand: Modified independent (Device/Increase time)         General transfer comment: used armrests, otherwise no difficulty  Ambulation/Gait Ambulation/Gait assistance: Supervision Ambulation Distance (Feet): 150 Feet Assistive device: None;Straight cane   Gait velocity: WNL   General Gait Details: One loss of balance during amb without assistivie device; better with cane; cues to self-monitor for activity   Stairs            Wheelchair Mobility    Modified Rankin (Stroke Patients Only)       Balance Overall balance assessment: Needs assistance Sitting-balance support: No upper extremity supported;Feet supported Sitting balance-Leahy  Scale: Good     Standing balance support: No upper extremity supported;During functional activity Standing balance-Leahy Scale: Good                               Pertinent Vitals/Pain Pain Assessment: No/denies pain    Home Living Family/patient expects to be discharged to:: Private residence Living Arrangements: Spouse/significant other Available Help at Discharge: Available 24 hours/day Type of Home: Mobile home Home Access: Stairs to enter   [employer] of Steps: 2 Home Layout: One level Home Equipment: Walker - 2 wheels;Cane - single point      Prior Function Level of Independence: Independent         Comments: pt reports he is disabled, enjoys fishing and performed all his own ADLs     Hand Dominance        Extremity/Trunk Assessment   Upper Extremity Assessment Upper Extremity Assessment: Overall WFL for tasks assessed    Lower Extremity Assessment Lower Extremity Assessment: Generalized weakness    Cervical / Trunk Assessment Cervical / Trunk Assessment: Normal  Communication   Communication: No difficulties  Cognition Arousal/Alertness: Awake/alert Behavior During Therapy: WFL for tasks assessed/performed Overall Cognitive Status: Within Functional Limits for tasks assessed                                 General Comments: Per discussion with Case Mgr, Wayne Benson did not have his home O2 set up correctly at home, which also led to this admission; noted problems  with DM management as well      General Comments General comments (skin integrity, edema, etc.): SpO2 on 2L. At rest 93. with activity 86. Returned to 92 once seated in recliner and after a few minutes.    Exercises     Assessment/Plan    PT Assessment Patient needs continued PT services  PT Problem List Decreased activity tolerance;Decreased knowledge of precautions;Cardiopulmonary status limiting activity;Decreased knowledge of use of DME        PT Treatment Interventions Gait training;Therapeutic exercise;Stair training;Balance training;Functional mobility training;Patient/family education;DME instruction;Therapeutic activities    PT Goals (Current goals can be found in the Care Plan section)  Acute Rehab PT Goals Patient Stated Goal: return home and to fishing PT Goal Formulation: With patient Time For Goal Achievement: 09/09/16 Potential to Achieve Goals: Good    Frequency Min 3X/week   Barriers to discharge        Co-evaluation               AM-PAC PT "6 Clicks" Daily Activity  Outcome Measure Difficulty turning over in bed (including adjusting bedclothes, sheets and blankets)?: None Difficulty moving from lying on back to sitting on the side of the bed? : None Difficulty sitting down on and standing up from a chair with arms (e.g., wheelchair, bedside commode, etc,.)?: None Help needed moving to and from a bed to chair (including a wheelchair)?: None Help needed walking in hospital room?: None Help needed climbing 3-5 steps with a railing? : A Little 6 Click Score: 23    End of Session Equipment Utilized During Treatment: Gait belt;Oxygen Activity Tolerance: Patient tolerated treatment well Patient left: in chair;with call bell/phone within reach Nurse Communication: Mobility status PT Visit Diagnosis: Other (comment) (decr functional capacity)    Time: [phone] [id number] (end time is approximate) PT Time Calculation (min) (ACUTE ONLY): 27 min   Charges:   PT Evaluation $PT Eval Low Complexity: 1 Procedure PT Treatments $Gait Training: 8-22 mins   PT G Codes:        Van ClinesHolly Brendy Ficek, PT  Acute Rehabilitation Services Pager [phone] Office [phone]   Wayne AlandHolly H Carrieann Spielberg 08/26/2016, 1:41 PM

## 2016-08-26 NOTE — Evaluation (Signed)
Occupational Therapy Evaluation Patient Details Name: Wayne Benson MRN: [id number] DOB: 1956-01-12 Today's Date: 08/26/2016    History of Present Illness Wayne Benson is a 61 year old man with cirrhosis secondary to NAFLD, esophageal varices, and insulin-dependent type 2 diabetes. He presented to the hospital with chief complaint of dyspnea.   Clinical Impression   PTA, pt was independent in his ADLs and living with his wife. Currently, pt demonstrates decreased activity tolerance and fatigues quickly. Pt demonstrated dyspnea during functional mobility to bathroom and toilet transfer. Pt SpO2 on 2L O2: rest 93% and with activity 86%. Pt would benefit from continues skilled OT to increase pt endurance and activity tolerance as well as educate on energy conservation. Recommend dc home once medically stable per physician.    Follow Up Recommendations  No OT follow up;Supervision - Intermittent    Equipment Recommendations  None recommended by OT    Recommendations for Other Services       Precautions / Restrictions Precautions Precautions: Fall;Other (comment) Precaution Comments: Watch O2 sats      Mobility Bed Mobility Overal bed mobility: Modified Independent                Transfers Overall transfer level: Modified independent Equipment used: None   Sit to Stand: Modified independent (Device/Increase time)         General transfer comment: used armrests, otherwise no difficulty    Balance Overall balance assessment: Needs assistance Sitting-balance support: No upper extremity supported;Feet supported Sitting balance-Leahy Scale: Good     Standing balance support: No upper extremity supported;During functional activity Standing balance-Leahy Scale: Good                             ADL either performed or assessed with clinical judgement   ADL Overall ADL's : Needs assistance/impaired Eating/Feeding: Set up;Sitting   Grooming: Set  up;Supervision/safety;Standing   Upper Body Bathing: Set up;Sitting   Lower Body Bathing: Set up;Sit to/from stand;Supervison/ safety   Upper Body Dressing : Set up;Sitting   Lower Body Dressing: Set up;Supervision/safety;Sit to/from stand Lower Body Dressing Details (indicate cue type and reason): Demonstrated good ROM to bring ankle to knee when adjusting socks Toilet Transfer: Minimal assistance Toilet Transfer Details (indicate cue type and reason): Min A to power up from toilet without use of grab bar         Functional mobility during ADLs: Supervision/safety General ADL Comments: Pt demonstrates decreased activity tolerance and quickly fatigues. During functional mobility, pt SpO2 dropped to 86% on 2L O2. Educated pt on purse lip breathing. Pt would benefit from energy conservation education.     Vision         Perception     Praxis      Pertinent Vitals/Pain Pain Assessment: No/denies pain     Hand Dominance     Extremity/Trunk Assessment Upper Extremity Assessment Upper Extremity Assessment: Overall WFL for tasks assessed   Lower Extremity Assessment Lower Extremity Assessment: Generalized weakness   Cervical / Trunk Assessment Cervical / Trunk Assessment: Normal   Communication Communication Communication: No difficulties   Cognition Arousal/Alertness: Awake/alert Behavior During Therapy: WFL for tasks assessed/performed Overall Cognitive Status: Within Functional Limits for tasks assessed                                 General Comments: Per discussion with Case Mgr, Wayne Benson did not  have his home O2 set up correctly at home, which also led to this admission; noted problems with DM management as well   General Comments  SpO2 on 2L. At rest 93. with activity 86. Returned to 92 once seated in recliner and after a few minutes.    Exercises     Shoulder Instructions      Home Living Family/patient expects to be discharged to::  Private residence Living Arrangements: Spouse/significant other Available Help at Discharge: Available 24 hours/day Type of Home: Mobile home Home Access: Stairs to enter [employer] CorporationEntrance Stairs-Number of Steps: 2   Home Layout: One level     Bathroom Shower/Tub: Chief Strategy OfficerTub/shower unit   Bathroom Toilet: Standard     Home Equipment: Environmental consultantWalker - 2 wheels;Cane - single point          Prior Functioning/Environment Level of Independence: Independent        Comments: pt reports he is disabled, enjoys fishing and performed all his own ADLs. Pt stated "I just live a normal life."        OT Problem List: Decreased activity tolerance;Decreased knowledge of use of DME or AE;Decreased knowledge of precautions      OT Treatment/Interventions: Self-care/ADL training;Therapeutic exercise;Energy conservation;DME and/or AE instruction;Therapeutic activities;Patient/family education    OT Goals(Current goals can be found in the care plan section) Acute Rehab OT Goals Patient Stated Goal: return home and to fishing OT Goal Formulation: With patient Time For Goal Achievement: 09/09/16 Potential to Achieve Goals: Good ADL Goals Pt Will Perform Grooming: with modified independence;standing Pt Will Transfer to Toilet: with modified independence;regular height toilet;ambulating Additional ADL Goal #1: Pt will verbalize three energy conservation strategies with Min verbcal cues  OT Frequency: Min 2X/week   Barriers to D/C:            Co-evaluation              AM-PAC PT "6 Clicks" Daily Activity     Outcome Measure Help from another person eating meals?: None Help from another person taking care of personal grooming?: A Little Help from another person toileting, which includes using toliet, bedpan, or urinal?: A Little Help from another person bathing (including washing, rinsing, drying)?: A Little Help from another person to put on and taking off regular upper body clothing?: None Help from  another person to put on and taking off regular lower body clothing?: A Little 6 Click Score: 20   End of Session Equipment Utilized During Treatment: Gait belt Nurse Communication: Mobility status  Activity Tolerance: Patient tolerated treatment well;Patient limited by fatigue Patient left: in chair;with call bell/phone within reach  OT Visit Diagnosis: Unsteadiness on feet (R26.81);Muscle weakness (generalized) (M62.81)                Time: [id number] OT Time Calculation (min): 20 min Charges:  OT General Charges $OT Visit: 1 Procedure OT Evaluation $OT Eval Low Complexity: 1 Procedure G-Codes:     Wayne Benson, OTR/L [phone]  Wayne GristCharis M Doralyn Benson 08/26/2016, 12:59 PM

## 2016-08-26 NOTE — Progress Notes (Signed)
Spoke with patient about his diabetes. Has been on U-500 insulin 100 units with breakfast, 50 units with lunch, and 100 units with dinner.  He has been dealing with his mother's death last week, health issues with his brother, and helping to transport family members different places. States that he has not been taking care of himself, but he has decided that he must change his ways. [facility] Will need to get more strips [facility] [place] [facility]  Will need to follow up with his PCP there at discharge. Will need to be checking blood sugars during the day at home.   Agree with current U-500 insulin regimen of 65 units at breakfast, 35 units as lunch, and 65 units at supper. Can titrate dosage as needed. Patient seems to understand the importance of taking his insulin at regular times every day. States that he finds it easier to check blood sugars if he has to use a sliding scale for insulin dosages. May have some questions about meal planing. States that the steroids make him extremely hungry. May need alternative ideas to resolve the hunger.  Will continue to monitor blood sugars while in the hospital.   Smith MinceKendra Kollin Udell RN BSN CDE Diabetes Coordinator Pager: [phone]  8am-5pm

## 2016-08-26 NOTE — Progress Notes (Signed)
   Subjective: [name] is doing well today with no dyspnea at rest although does endorse SOB with ambulation. Feels like his appetite increased since starting steroids causing him to overeat and gain weight. Continues to note that wife is still too ill to provide the care that he needs.  Provided additional history ZO:[id number]:abdominal swelling; had what sounds like paracentesis with drainage of "10 2L's" of fluid from his abdoment. This was done due to diffuse body swelling several years ago.  Objective:  Vital signs in last 24 hours: Vitals:   08/25/16 1658 08/25/16 2053 08/26/16 0607 08/26/16 0928  BP: 140/78 (!) 144/68 125/81 (!) 118/58  Pulse: 98 83 78 84  Resp: 20 (!) 21 20 19   Temp: 98.5 F (36.9 C) 98.1 F (36.7 C) 99 F (37.2 C) 97.7 F (36.5 C)  TempSrc: Oral Oral Oral Oral  SpO2: 96% (!) 88% (!) 89% 90%  Weight:  192 lb 0.3 oz (87.1 kg)    Height:       GENERAL- Alert, co-operative, NAD CARDIAC- RRR, no murmurs, rubs or gallops RESP- Mild diffuse inspiratory crackles, normal WOB at rest, slight dyspnea with conversation ABDOMEN- Nontender and protuberant. +bs EXTREMITIES- Warm and perfused. No peripheral edema appreciated. Moves all 4 extremities without issue.  SKIN- Warm, dry, not jaundiced.   Assessment/Plan: #Acute respiratory distress with hypoxia: Secondary to medication non-compliance (did not pick up steroids after d/c), improper medical device use (only attached to humidifier), and [facility] He notes his wife remains ill and feels she is unable to provide the assistance he needs currently at home.  -Ambulated with PT - dropped to 82% on RA while ambulating however saturated 89-92% on 3L with exertion.  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 Reported he was never really educated on diabetes when he was first diagnosed. CBG better controlled today.  -Continue daily scheduled 165 units total + sliding scale -Hyperglycemia exacerbated by pred, expect improvement with taper  #Thrombocytopenia  Chronically low 2/2 liver cirrhosis. He is on SCDs only for VTE ppx.  #Cirrhosis, Esophageal varices Abdomen remains non-tender and distention improved however still present. Endorsed hx of large-volume paracentesis while he had diffuse body swelling several years ago.   Dispo: Anticipated discharge to home once environment is safer or to skilled nursing facility for temporary care and rehab.   Wayne ChapelBethany Adis Sturgill, DO Internal Medicine Resident, PGY1 Pager# [phone] 08/26/2016, 8:16 AM

## 2016-08-26 NOTE — Progress Notes (Signed)
Interval Progress Note:  Evaluated the patient around 530 pm this evening. Was comfortably eating dinner on his own without issue. Good discussion was had regarding what changes could be made to prevent further re-admissions. He reports that his oxygen tubing is not connected to the oxygen tank at home and does not know how to do this. He reports frustration over the perceived lack of education regarding [place] from the [facility] and oxygen. He notes that someone is 'coming by to look at' his air conditioning Wednesday and is hopeful it will be repaired then.  I asked for permission to discuss his case with his sister, Kenney Housemananya who is an Charity fundraiserN, who is currently recovering out-of-town from multi-level spine surgery. She is unable to travel to assist with the care of her brother due to this. She reports that pt historically has not treated his wife well and she does not spend any time in the home secondary to this. She is living with her family elsewhere and provides little to no care for her husband. Kenney Housemananya also reports that the patient is unable or refuses to care for himself for whatever reason. She reports he cannot give himself his insulin nor take his medications.   We are in a difficult situation. Patient does not qualify for SNF and his discharge options are limited secondary to [employer] insurance. This patient has several barriers to discharge tonight including: -not knowing how to use his home oxygen  -his [place] is not attached to the tank and does not know how to use this  -he does not/will not administer his own medications or insulin  -continues to have poor insight into his illness and requires additional education -no support at home to provide the patient with 24-hr assistance  -[facility] not [facility] able to make a visit tonight or early tomorrow morning to assist the patient.   We will need [facility] to coordinate specific dates/times so that discharge will be more likely to succeed.  We greatly appreciate CSW work in this case and appreciate any additional assistance.

## 2016-08-27 LAB — BASIC METABOLIC PANEL
Anion gap: 9 (ref 5–15)
BUN: 18 mg/dL (ref 6–20)
CALCIUM: 8.6 mg/dL — AB (ref 8.9–10.3)
CO2: 25 mmol/L (ref 22–32)
CREATININE: 0.66 mg/dL (ref 0.61–1.24)
Chloride: 103 mmol/L (ref 101–111)
GFR calc Af Amer: >60 mL/min (ref >60)
GFR calc non Af Amer: >60 mL/min (ref >60)
GLUCOSE: 266 mg/dL — AB (ref 65–99)
Potassium: 4.5 mmol/L (ref 3.5–5.1)
Sodium: 137 mmol/L (ref 135–145)

## 2016-08-27 LAB — CBC
HEMATOCRIT: 40.8 % (ref 39.0–52.0)
Hemoglobin: 13.3 g/dL (ref 13.0–17.0)
MCH: 30.6 pg (ref 26.0–34.0)
MCHC: 32.6 g/dL (ref 30.0–36.0)
MCV: 93.8 fL (ref 78.0–100.0)
Platelets: 39 10*3/uL — ABNORMAL LOW (ref 150–400)
RBC: 4.35 MIL/uL (ref 4.22–5.81)
RDW: 15.5 % (ref 11.5–15.5)
WBC: 7.4 10*3/uL (ref 4.0–10.5)

## 2016-08-27 LAB — GLUCOSE, CAPILLARY
Glucose-Capillary: 299 mg/dL — ABNORMAL HIGH (ref 65–99)
Glucose-Capillary: 223 mg/dL — ABNORMAL HIGH (ref 65–99)
Glucose-Capillary: 122 mg/dL — ABNORMAL HIGH (ref 65–99)
Glucose-Capillary: 90 mg/dL (ref 65–99)

## 2016-08-27 MED ORDER — PREDNISONE 20 MG PO TABS: 40.0000 mg | ORAL_TABLET | Freq: Every day | ORAL | 0 refills | Status: DC

## 2016-08-27 MED ORDER — PREDNISONE 20 MG PO TABS: ORAL_TABLET | ORAL | 0 refills | Status: DC

## 2016-08-27 MED ORDER — INSULIN REGULAR HUMAN (CONC) 500 UNIT/ML ~~LOC~~ SOPN: 65.0000 [IU] | PEN_INJECTOR | Freq: Two times a day (BID) | SUBCUTANEOUS | 2 refills | Status: DC

## 2016-08-27 MED ORDER — ALBUTEROL SULFATE HFA 108 (90 BASE) MCG/ACT IN AERS: 2.0000 | INHALATION_SPRAY | Freq: Four times a day (QID) | RESPIRATORY_TRACT | 2 refills | Status: DC | PRN

## 2016-08-27 MED ORDER — GUAIFENESIN ER 600 MG PO TB12: 600.0000 mg | ORAL_TABLET | Freq: Two times a day (BID) | ORAL | 0 refills | Status: DC

## 2016-08-27 MED ORDER — IPRATROPIUM-ALBUTEROL 0.5-2.5 (3) MG/3ML IN SOLN: 3.0000 mL | Freq: Four times a day (QID) | RESPIRATORY_TRACT | Status: DC | PRN

## 2016-08-27 MED ORDER — PREDNISONE 20 MG PO TABS
20.0000 mg | ORAL_TABLET | Freq: Every day | ORAL | Status: AC
  Administered 2016-08-28: 20 mg via ORAL
  Filled 2016-08-27: qty 1

## 2016-08-27 MED ORDER — INSULIN REGULAR HUMAN (CONC) 500 UNIT/ML ~~LOC~~ SOPN: 35.0000 [IU] | PEN_INJECTOR | Freq: Every day | SUBCUTANEOUS | 2 refills | Status: DC

## 2016-08-27 NOTE — Progress Notes (Signed)
Occupational Therapy Treatment Patient Details Name: Wayne Benson MRN: [id number] DOB: 10/26/1955 Today's Date: 08/27/2016    History of present illness [name] is a 61 year old man with cirrhosis secondary to NAFLD, esophageal varices, and insulin-dependent type 2 diabetes. He presented to the hospital with chief complaint of dyspnea.   OT comments  Pt currently with decr oxygen levels and self reports feeling that cognition is off compared to baseline at this time. "ive noticed it makes you fuzzy headed. I hear what you are saying because this is my lift and I have to make some changes"   Follow Up Recommendations  SNF    Equipment Recommendations  3 in 1 bedside commode    Recommendations for Other Services      Precautions / Restrictions Precautions Precautions: Fall;Other (comment) Precaution Comments: Watch O2 sats       Mobility Bed Mobility Overal bed mobility: Modified Independent Bed Mobility: Supine to Sit     Supine to sit: Modified independent (Device/Increase time)        Transfers Overall transfer level: Modified independent Equipment used: None                  Balance     Sitting balance-Leahy Scale: Good                                     ADL either performed or assessed with clinical judgement   ADL                                         General ADL Comments: provided education and extensive energy conservation education. Pt provided handout and states he plans to reduce water temperature and ride the cart at [employer]. pt advised on heavy smells like cleaning products at home      Vision       Perception     Praxis      Cognition Arousal/Alertness: Awake/alert Behavior During Therapy: WFL for tasks assessed/performed Overall Cognitive Status: Within Functional Limits for tasks assessed                                 General Comments: discussed fall risk due to decr O2  levels with PT observed early        Exercises     Shoulder Instructions       General Comments      Pertinent Vitals/ Pain       Pain Assessment: No/denies pain  Home Living                                          Prior Functioning/Environment              Frequency  Min 2X/week        Progress Toward Goals  OT Goals(current goals can now be found in the care plan section)  Progress towards OT goals: Progressing toward goals  Acute Rehab OT Goals Patient Stated Goal: return home and to fishing OT Goal Formulation: With patient Time For Goal Achievement: 09/09/16 Potential to Achieve Goals: Good ADL Goals Pt Will Perform Grooming: with modified independence;standing Pt  Will Transfer to Toilet: with modified independence;regular height toilet;ambulating Additional ADL Goal #1: Pt will verbalize three energy conservation strategies with Min verbcal cues  Plan Discharge plan needs to be updated    Co-evaluation                 AM-PAC PT "6 Clicks" Daily Activity     Outcome Measure   Help from another person eating meals?: None Help from another person taking care of personal grooming?: A Little Help from another person toileting, which includes using toliet, bedpan, or urinal?: A Little Help from another person bathing (including washing, rinsing, drying)?: A Little Help from another person to put on and taking off regular upper body clothing?: None Help from another person to put on and taking off regular lower body clothing?: A Little 6 Click Score: 20    End of Session    OT Visit Diagnosis: Unsteadiness on feet (R26.81);Muscle weakness (generalized) (M62.81)   Activity Tolerance Patient tolerated treatment well   Patient Left in bed;with call bell/phone within reach   Nurse Communication Mobility status;Precautions        Time: 1610-96041400-1429 OT Time Calculation (min): 29 min  Charges: OT General Charges $OT Visit:  1 Procedure OT Treatments $Self Care/Home Management : 23-37 mins   Wayne Benson, Benson   OTR/L Pager: [phone] Office: [phone] .    Wayne Benson, Wayne Benson 08/27/2016, 4:30 PM

## 2016-08-27 NOTE — Progress Notes (Signed)
Inpatient Diabetes Program Recommendations  AACE/ADA: New Consensus Statement on Inpatient Glycemic Control (2015)  Target Ranges:  Prepandial:   less than 140 mg/dL      Peak postprandial:   less than 180 mg/dL (1-2 hours)      Critically ill patients:  140 - 180 mg/dL   Results for Wayne Benson, Wayne Benson (MRN [id number]) as of 08/27/2016 12:50  Ref. Range 08/26/2016 07:58 08/26/2016 12:18 08/26/2016 16:39 08/26/2016 21:24 08/27/2016 07:52 08/27/2016 11:52  Glucose-Capillary Latest Ref Range: 65 - 99 mg/dL 409154 (H) 811152 (H) 914205 (H) 94 299 (H) 223 (H)   Review of Glycemic Control  Diabetes history: DM 2 Outpatient Diabetes medications: Humulin U-500 insulin 100 units with breakfast, 50 units with lunch, and 100 units with dinner Current orders for Inpatient glycemic control: Humulin U-500 65 units breakfast, 35 units lunch,  65 units supper  Inpatient Diabetes Program Recommendations:   Glucose trends running high still. Consider increasing U-500 dose to 70 units breakfast - 40 units lunch - 70 units supper.  Thanks,  Christena DeemShannon Kolden Dupee RN, MSN, [facility] Inpatient Diabetes Coordinator Team Pager [phone] (8a-5p)

## 2016-08-27 NOTE — Progress Notes (Signed)
Transitions of Care Pharmacy Note  Plan:  Educated on inhaler use and prednisone taper --------------------------------------------- Wayne Benson is an 61 y.o. male who presents with a chief complaint difficulty breathing. In anticipation of discharge, pharmacy has reviewed this patient's prior to admission medication history, as well as [facility].  Current medication indications, dosing, frequency, and notable side effects reviewed with patient. patient verbalized understanding of current inpatient medication regimen and is aware that the After Visit Summary when presented, will represent the most accurate medication list at discharge.   Wayne Benson did not express any concerns regarding his medications. We discussed inhaler technique for his new albuterol inhaler and drug counseling for his prednisone taper.    Assessment: Understanding of regimen: good Understanding of indications: good Potential of compliance: good Barriers to Obtaining Medications: No  Patient instructed to contact inpatient pharmacy team with further questions or concerns if needed.    Time spent preparing for discharge counseling: 10 Time spent counseling patient: 2815   Thank you for allowing pharmacy to be a part of this patient's care.  Gwyndolyn KaufmanKai Zakhia Seres Bernette Redbird(Kenny), PharmD  PGY1 Pharmacy Resident Pager: [phone] 08/27/2016 4:25 PM

## 2016-08-27 NOTE — Progress Notes (Signed)
Physical Therapy Treatment Patient Details Name: Wayne Benson MRN: [id number] DOB: [birth year] Today's Date: 08/27/2016    History of Present Illness Wayne Benson is a 61 year old man with cirrhosis secondary to NAFLD, esophageal varices, and insulin-dependent type 2 diabetes. He presented to the hospital with chief complaint of dyspnea.    PT Comments    Continuing work on functional mobility and activity tolerance;  Presents with decr functional capacity; unable to walk greater than approx 30 feet before needing a rest break due to decr O2 sats (even on 4L supplemental O2); At this point, I recommend SNF for post-acute rehab to maximize independence and safety with mobility prior to dc home; will also benefit from continuing education at SNF re: diabetes management; SW, Case Mgr, and MD notified   Follow Up Recommendations  SNF     Equipment Recommendations  None recommended by PT    Recommendations for Other Services       Precautions / Restrictions Precautions Precautions: Fall;Other (comment) Precaution Comments: Watch O2 sats    Mobility  Bed Mobility Overal bed mobility: Modified Independent Bed Mobility: Supine to Sit     Supine to sit: Modified independent (Device/Increase time)        Transfers Overall transfer level: Modified independent Equipment used: None                Ambulation/Gait Ambulation/Gait assistance: Supervision;Min guard Ambulation Distance (Feet): 30 Feet (x4; unable to wqalk greater than 30 feet without needing rest break to keep O2 sats at acceptable levels) Assistive device: Straight cane Gait Pattern/deviations: Step-through pattern     General Gait Details: Cues for use of cane and to self-monitor for activity tolerance; better awareness and practice of controlled breathing; Monitored O2 sats constantly during amb, and Wayne Benson required seated or standing rest breaks every at least 30 feet to keep O2 sats at acceptable levels;  titrated supplemental O2 up to 4 L and still sats dropped as low as 83%   Stairs         General stair comments: Plan was to practice stairs today, however pt with decr fucntional capacity and unable to keep O2 sats at acceptable levels; at this point, he does not have the functional capacity to manage steps to enter his home  Wheelchair Mobility    Modified Rankin (Stroke Patients Only)       Balance     Sitting balance-Leahy Scale: Good                                      Cognition Arousal/Alertness: Awake/alert Behavior During Therapy: WFL for tasks assessed/performed Overall Cognitive Status: Within Functional Limits for tasks assessed                                 General Comments: Per discussion with Case Mgr, Wayne Benson did not have his home O2 set up correctly at home, which also led to this admission; noted problems with DM management as well      Exercises      General Comments        Pertinent Vitals/Pain Pain Assessment: No/denies pain    Home Living                      Prior Function  PT Goals (current goals can now be found in the care plan section) Acute Rehab PT Goals PT Goal Formulation: With patient Time For Goal Achievement: 09/09/16 Potential to Achieve Goals: Good Progress towards PT goals: Progressing toward goals    Frequency    Min 3X/week      PT Plan Discharge plan needs to be updated    Co-evaluation              AM-PAC PT "6 Clicks" Daily Activity  Outcome Measure  Difficulty turning over in bed (including adjusting bedclothes, sheets and blankets)?: None Difficulty moving from lying on back to sitting on the side of the bed? : None Difficulty sitting down on and standing up from a chair with arms (e.g., wheelchair, bedside commode, etc,.)?: None Help needed moving to and from a bed to chair (including a wheelchair)?: None Help needed walking in hospital  room?: A Little Help needed climbing 3-5 steps with a railing? : A Little 6 Click Score: 22    End of Session Equipment Utilized During Treatment: Gait belt;Oxygen Activity Tolerance: Patient tolerated treatment well Patient left: in chair;with call bell/phone within reach Nurse Communication: Mobility status PT Visit Diagnosis: Other (comment) (decr functional capacity)     Time: 1610-96041133-1204 PT Time Calculation (min) (ACUTE ONLY): 31 min  Charges:  $Gait Training: 23-37 mins                    G Codes:       Van ClinesHolly Nyle Limb, PT  Acute Rehabilitation Services Pager [phone] Office [phone]    Levi AlandHolly H Aldric Wenzler 08/27/2016, 3:08 PM

## 2016-08-27 NOTE — Care Management Note (Addendum)
Case Management Note  Patient Details  Name: Anselm JunglingDuke L Alessio MRN: [id number] Date of Birth: 02-18-1956  Subjective/Objective:     CM following for progression and d/c planning.                Action/Plan: 08/27/2016 Pt for d/c to home, wife to bring oxygen for transport to home. [facility] of respiratory issues and diabetes.  08/27/2016 12n per PT patient sats 84% on 4l oxygen with ambulation. PT now recommending short term SNF for rehab to increase endurance. Resident notified and will cancel d/c order. CSW, Alvina ChouV Crawford working with pt re SNF placement the the [place] approval.   Expected Discharge Date:       08/29/2016          Expected Discharge Plan:  Home w Home Health Services  In-House Referral:  Clinical Social Work  Discharge planning Services  CM Consult  Post Acute Care Choice:  Durable Medical Equipment, Home Health Choice offered to:  Patient  DME Arranged:  N/A DME Agency:  NA  HH Arranged:  RN, PT HH Agency:  [facility]  Status of Service:  Completed, signed off  If discussed at Long Length of Stay Meetings, dates discussed:    Additional Comments:  Starlyn SkeansRoyal, Coburn Knaus U, RN 08/27/2016, 11:24 AM

## 2016-08-27 NOTE — Plan of Care (Signed)
Problem: Food- and Nutrition-Related Knowledge Deficit (NB-1.1) Goal: Nutrition education Formal process to instruct or train a patient/client in a skill or to impart knowledge to help patients/clients voluntarily manage or modify food choices and eating behavior to maintain or improve health. Outcome: Completed/Met Date Met: 08/27/16  RD consulted for nutrition education regarding diabetes.   Lab Results  Component Value Date   HGBA1C 12.0 (H) 08/06/2016    RD provided "Carbohydrate Counting for People with Diabetes" handout from the Academy of Nutrition and Dietetics. Discussed different food groups and their effects on blood sugar, emphasizing carbohydrate-containing foods. Provided list of carbohydrates and recommended serving sizes of common foods.  Discussed importance of controlled and consistent carbohydrate intake throughout the day. Provided examples of ways to balance meals/snacks and encouraged intake of high-fiber, whole grain complex carbohydrates. Teach back method used.  Expect fair compliance. Pt with many social stressors at present time (mother recently passed away, wife is disabled, brother in prison, etc) with limited capacity for education at present. Pt appears to be ready for change but difficulty focusing on education due to stressors. Pt may benefit from outpatient DM education.  Body mass index is 27.55 kg/m.   Current diet order is Carb Modified, patient is consuming approximately 100% of meals at this time. Labs and medications reviewed. No further nutrition interventions warranted at this time. RD contact information provided. If additional nutrition issues arise, please re-consult RD.  Kerman Passey MS, RD, LDN [phone] Pager  [phone] Weekend/On-Call Pager

## 2016-08-27 NOTE — Clinical Social Work Note (Signed)
Patient evaluated by PT and rehab recommended. CSW visited with patient and talked with him about discharge disposition, (full assessment to follow) and he is in agreement with SNF placement. Patient's insurance is [employer]. Calls made to the following [facility] personnel:  1. Romero BellingMary Walker [phone] and message left. 2. Abelardo DieselErica Hoskins [phone]- [phone]. Ms. Isabell JarvisHoskins advised of services needed for patient ([facility] rehab) and she informed CSW that she is in the field, but would get back with CSW and give her a number(s) to call regarding placement for patient. 3. Cory RoughenMary Beth Walker - Received call from Ms. Walker and was given 2 numbers to try for placement for patient: [phone], ext [phone] and ext [phone]. 4. Coralee NorthAlicia Driver [place] - ext. [phone] - call made and message left.  CSW will attempt contact with numbers provided on Wednesday, 5/9.  Genelle BalVanessa Viveka Wilmeth, MSW, LCSW Licensed Clinical Social Worker Clinical Social Work Department [facility] [phone]

## 2016-08-27 NOTE — Progress Notes (Signed)
Student Pharmacists Alphonsus Doyel Clovis RileyMitchell and Elmer PickerNick Caudle rounding with the IM Teaching Service/B-2/Lane Service. Requested by Pharmacy PGY1 Dr. Lavella LemonsArminger to go to bedside and provide disease specific teaching regarding a chronic medical condition (diabetes) for the patient. Discussed importance of adherence to medication regimens, lifestyle modification, self-monitoring of capillary blood glucose determinations (patient does have a device, an AccuCheck device), discussed signs and symptoms of hypoglycemia and how to manage if it declares itself. Finally, discussed importance of follow-up with his endocrinologist.  Drusilla Kannerevan Shalita Notte, PharmD Candidate, CPHS Elmer PickerNick Caudle, PharmD Candidate, CPHS

## 2016-08-27 NOTE — Progress Notes (Signed)
Interval Progress Note:  Was informed at 12:25 pm that patient desaturated to 86% on 4L via [place] while working with physical therapy this afternoon. He was symptomatic with dyspnea and weakness and PT is now recommending SNF for short-term rehabilitation. Discharge orders have been canceled and CSW has been contacted and are working with [place] on placement. Believe patient would benefit greatly from SNF placement due to his prolonged hospitalization with associated deconditioning. In addition, he requires more oxygen with exertion than his home supply can provide.   Plan: Greatly appreciate CSW and CM assistance in placement of this patient in SNF. D/c discharge orders.

## 2016-08-27 NOTE — [place] FL2 (Signed)
[place] MEDICAID FL2 LEVEL OF CARE SCREENING TOOL     IDENTIFICATION  Patient Name: Wayne Benson Birthdate: 1956-03-25 Sex: male Admission Date (Current Location): 08/23/2016  [facility] [place]   Facility and Address:  [facility] [place], [address], [place], [place] [address]      Provider Number: [id number]  Attending Physician Name and Address:  Tyson AliasVincent, Duncan Thomas, *  Relative Name and Phone Number:  Evalyn Cascoelson,Kathy - Spouse; [phone], [phone]     Current Level of Care: Hospital Recommended Level of Care: Skilled Nursing Facility Prior Approval Number:    Date Approved/Denied:   PASRR Number:  (Applied for [place] Ran[place]R - MUST LO#[id number])  Discharge Plan: SNF    Current Diagnoses: Patient Active Problem List   Diagnosis Date Noted  . Acute respiratory failure with hypoxia (HCC) 08/24/2016  . Hypoglycemia 08/23/2016  . Acute hypoxemic respiratory failure (HCC)   . ILD (interstitial lung disease) (HCC)   . ARDS (adult respiratory distress syndrome) (HCC)   . Hypoxemia   . Respiratory failure (HCC)   . Influenza A 08/06/2016  . Thrombocytopenia (HCC) 08/06/2016  . Diabetes (HCC) 01/20/2016  . Esophageal varices in cirrhosis (HCC) 01/20/2016  . Tobacco abuse 01/20/2016  . GERD (gastroesophageal reflux disease) 01/20/2016  . Liver cirrhosis secondary to NASH (HCC) 01/20/2016    Orientation RESPIRATION BLADDER Height & Weight     Self, Time, Situation, Place  Normal Continent Weight: 192 lb 0.3 oz (87.1 kg) Height:  5\' 10"  (177.8 cm)  BEHAVIORAL SYMPTOMS/MOOD NEUROLOGICAL BOWEL NUTRITION STATUS      Continent Diet (Carb modified/thin fluid consistency)  AMBULATORY STATUS COMMUNICATION OF NEEDS Skin   Supervision Verbally Other (Comment) (Excoriated right/left buttocks, Stage 1 pressure injury to right/left buttocks)                       Personal Care Assistance Level of Assistance  Bathing, Feeding,  Dressing Bathing Assistance: Limited assistance (Assistance with set-up) Feeding assistance: Limited assistance (Assistance with set-up) Dressing Assistance: Limited assistance (Assistance with set-up)     Functional Limitations Info  Sight, Hearing, Speech Sight Info: Impaired (Wears glasses for reading) Hearing Info: Adequate Speech Info: Adequate    SPECIAL CARE FACTORS FREQUENCY  PT (By licensed PT), OT (By licensed OT)     PT Frequency: Evaluated 5/7 and a minimum of 3X per week therapy recommended OT Frequency: Evaluated 5/7 and a minimum of 2X per week therapy recommended             Contractures Contractures Info: Not present    Additional Factors Info  Code Status, Allergies, Insulin Sliding Scale Code Status Info: Full Allergies Info: Duloxetine hcl   Insulin Sliding Scale Info: 0-9 Units insulin 3X per day with meals       Current Medications (08/27/2016):  This is the current hospital active medication list Current Facility-Administered Medications  Medication Dose Route Frequency Provider Last Rate Last Dose  . acetaminophen (TYLENOL) tablet 650 mg  650 mg Oral Q6H PRN Eulah PontBlum, Nina, MD   650 mg at 08/27/16 1210   Or  . acetaminophen (TYLENOL) suppository 650 mg  650 mg Rectal Q6H PRN Eulah PontBlum, Nina, MD      . chlorpheniramine-HYDROcodone (TUSSIONEX) 10-8 MG/5ML suspension 5 mL  5 mL Oral Q12H PRN Beather ArbourPatel, Rushil V, MD   5 mL at 08/27/16 0316  . feeding supplement (GLUCERNA SHAKE) (GLUCERNA SHAKE) liquid 237 mL  237 mL Oral TID  BM Fuller Plan, MD   237 mL at 08/27/16 1002  . gabapentin (NEURONTIN) capsule 900 mg  900 mg Oral QHS Heywood Iles V, MD   900 mg at 08/26/16 2207  . guaiFENesin (MUCINEX) 12 hr tablet 600 mg  600 mg Oral BID Molt, Bethany, DO   600 mg at 08/27/16 1002  . insulin aspart (novoLOG) injection 0-9 Units  0-9 Units Subcutaneous TID WC Beather Arbour, MD   3 Units at 08/27/16 1250  . insulin regular human CONCENTRATED (HUMULIN R) 500 UNIT/ML  kwikpen 35 Units  35 Units Subcutaneous QAC lunch Beather Arbour, MD   35 Units at 08/27/16 1250  . insulin regular human CONCENTRATED (HUMULIN R) 500 UNIT/ML kwikpen 65 Units  65 Units Subcutaneous BID WC Beather Arbour, MD   65 Units at 08/27/16 0853  . MEDLINE mouth rinse  15 mL Mouth Rinse BID Burns Spain, MD   15 mL at 08/27/16 1003  . nadolol (CORGARD) tablet 20 mg  20 mg Oral Daily Allena Katz, Rushil V, MD   20 mg at 08/27/16 1002  . senna-docusate (Senokot-S) tablet 1 tablet  1 tablet Oral QHS PRN Eulah Pont, MD      . simvastatin (ZOCOR) tablet 20 mg  20 mg Oral QHS Beather Arbour, MD   20 mg at 08/26/16 2207     Discharge Medications: Please see discharge summary for a list of discharge medications.  Relevant Imaging Results:  Relevant Lab Results:   Additional Information ss#[id number].    Cristobal Goldmann, LCSW

## 2016-08-27 NOTE — Discharge Instructions (Signed)
Wayne Benson, The EMS providers who brought you to the hospital correctly attached your oxygen so that it will be ready for you when you get home. You need to wear 3L of oxygen at ALL TIMES, including at rest, when you're walking around and when you're out in public.  We have made changes to your INSULIN regimen. Instead of your previous dosing, you will take HUMALOG 65 units in the morning with breakfast, 35 units at lunch and 65 units in the evening with dinner. You have received diabetic education during admission however we have a home health nurse coming to assist you with it as well. They will call you to set up a schedule.  You need to take prednisone after discharge. Please take 20 mg (1 tablet) for 2 days starting 5/9, then 10mg  (1/2 tablet) for 2 days.  You need to follow-up with your primary care provider shortly after discharge.

## 2016-08-27 NOTE — Progress Notes (Signed)
   Subjective: Feeling well today and had no acute events overnight. SOB improved and reports breathing is better while he is on oxygen.   Objective:  Vital signs in last 24 hours: Vitals:   08/26/16 0928 08/26/16 2121 08/27/16 0519 08/27/16 0928  BP: (!) 118/58 127/68 133/73 108/78  Pulse: 84 80 76 81  Resp: 19 17 20 20   Temp: 97.7 F (36.5 C) 98.6 F (37 C) 97.6 F (36.4 C) 97.8 F (36.6 C)  TempSrc: Oral   Oral  SpO2: 90% 90% 90% 90%  Weight:      Height:       GENERAL- Alert and interactive. Sitting upright in bed, just finished breakfast. In NAD.  CARDIAC- RRR, no murmurs, rubs or gallops appreciated RESP- CTA BL. Good air movement. Normal WOB at rest. Able to speak in complete sentences EXTREMITIES- Warm, well perfused. No peripheral edema appreciated.  SKIN- Dry. No cyanosis, jaundice.   Assessment/Plan: Acute on Chronic Respiratory Failure with Hypoxia: Doing well on 3L via [place] continuously. EMS re-connected his oxygen to oxygen tank and is ready for his use at home. Counseled on importance of continuous oxygen attached to tank at home and close follow-up. -Greatly appreciate pharmacy's assistance in additional medication instruction -HH RN, PT to provide the patient additional assistance at home. D/w CSW and ensured referral and HHRN awareness of d/c.  Interstitial lung disease, Hx of Silica exposure: Pred taper started and will be d/c home with remainder of brief taper. Confirmed with patient that he has prednisone at home and counseled about dose change.  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 CBG better controlled.  -Continue 65 units QAM, 35 units Qlunch and 65 units Qdinner -Hyperglycemia exacerbated by pred, expect improvement with taper  Thrombocytopenia:  Chronically low 2/2 liver cirrhosis. SCDs for VTE ppx.  Dispo: Anticipated discharge today.  Noemi ChapelBethany Carlo Lorson, DO Internal Medicine Resident, PGY1 Pager# [phone] 08/27/2016, 10:59 AM

## 2016-08-28 DIAGNOSIS — Z9189 Other specified personal risk factors, not elsewhere classified: Secondary | ICD-10-CM

## 2016-08-28 LAB — GLUCOSE, CAPILLARY
GLUCOSE-CAPILLARY: 168 mg/dL — AB (ref 65–99)
GLUCOSE-CAPILLARY: 87 mg/dL (ref 65–99)
Glucose-Capillary: 183 mg/dL — ABNORMAL HIGH (ref 65–99)
Glucose-Capillary: 255 mg/dL — ABNORMAL HIGH (ref 65–99)

## 2016-08-28 MED ORDER — INSULIN REGULAR HUMAN (CONC) 500 UNIT/ML ~~LOC~~ SOPN
40.0000 [IU] | PEN_INJECTOR | Freq: Every day | SUBCUTANEOUS | Status: DC
  Administered 2016-08-29: 40 [IU] via SUBCUTANEOUS

## 2016-08-28 MED ORDER — INSULIN REGULAR HUMAN (CONC) 500 UNIT/ML ~~LOC~~ SOPN: 40.0000 [IU] | PEN_INJECTOR | Freq: Every day | SUBCUTANEOUS | 0 refills | Status: DC

## 2016-08-28 MED ORDER — INSULIN REGULAR HUMAN (CONC) 500 UNIT/ML ~~LOC~~ SOPN: 70.0000 [IU] | PEN_INJECTOR | Freq: Two times a day (BID) | SUBCUTANEOUS | 0 refills | Status: DC

## 2016-08-28 MED ORDER — INSULIN REGULAR HUMAN (CONC) 500 UNIT/ML ~~LOC~~ SOPN
70.0000 [IU] | PEN_INJECTOR | Freq: Two times a day (BID) | SUBCUTANEOUS | Status: DC
  Administered 2016-08-28 – 2016-08-29 (×2): 70 [IU] via SUBCUTANEOUS

## 2016-08-28 MED ORDER — IPRATROPIUM-ALBUTEROL 0.5-2.5 (3) MG/3ML IN SOLN: 3.0000 mL | Freq: Four times a day (QID) | RESPIRATORY_TRACT | 0 refills | Status: DC | PRN

## 2016-08-28 NOTE — Clinical Social Work Note (Signed)
CS[facility] [phone], [phone]. [phone]) regarding patient needing ST rehab. Wayne faxed faxed  [facility] Screening Checklist and Application for Extended Care Services (that patient has to complete) to CSW. Information (10-10EC form) provided to patient and wife Wayne Benson (at the bedside) to complete so that it can be submitted with the rest of [place] [facility] [place] [facility]. CSW advised by Wayne to fax information before 11 am on Thursday, so that it can be reviewed in meeting they have each morning.   Wayne Benson, MSW, LCSW Licensed Clinical Social Worker Clinical Social Work Department [facility] [phone]

## 2016-08-28 NOTE — Progress Notes (Signed)
   Subjective: Feeling fine today. Eating and drinking well. Desaturated + became weak yesterday on 4L when working with PT. Now rec SNF placement which patient is agreeable to.   Objective:  Vital signs in last 24 hours: Vitals:   08/27/16 1802 08/27/16 2048 08/28/16 0519 08/28/16 0928  BP: 117/69 120/70 (!) 113/45 136/73  Pulse: 82 65 71 92  Resp: 20 20 20 18   Temp: 98.2 F (36.8 C) 98 F (36.7 C) 97.6 F (36.4 C) 98 F (36.7 C)  TempSrc: Oral Oral Oral Oral  SpO2: 92% 90% 90% 99%  Weight:  195 lb (88.5 kg)    Height:       GENERAL- Alert and interactive. Sitting up in bed without distress. Just finished breakfast.   CARDIAC- RRR without mgr.  RESP- CTA BL with normal WOB. On 3L via [place] and able to speak in complete sentences without dyspnea. EXTREMITIES- Warm, well perfused. No peripheral edema appreciated on BL LE. SKIN- Warm, dry and without cyanosis or jaundice.   Assessment/Plan: Acute on Chronic Respiratory Failure with Hypoxia, ILD with Hx of Silica exposure: Saturating well on 3L via [place] at rest however desaturated and was weak yesterday while working with PT. He was unable to ambulate beyond 30 feet without desaturation and needing rest. They now rec SNF placement which is ideal for this patient who requires additional support, education and continued PT following this prolonged hospitalization. Today last day of steroid taper.  -CSW working on SNF placement. Greatly appreciate their assistance.  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 CBGs consistently elevated, will increase Humalin U-500.  -Increase humulin to 70 units Qbreakfast and Qdinner, 40 units Qlunch -Hyperglycemia exacerbated by pred, expect improvement with taper  Thrombocytopenia: Chronically low 2/2  liver cirrhosis. SCDs for VTE ppx.  Dispo: Anticipated discharge when SNF bed available.  Noemi ChapelBethany Jelisa , DO Internal Medicine Resident, PGY1 Pager# [phone] 08/28/2016, 12:40 PM

## 2016-08-28 NOTE — Discharge Summary (Signed)
Name: Wayne Benson MRN: [id number] DOB: 11-May-1955 61 y.o. PCP: [facility]  Date of Admission: 08/23/2016  6:35 PM Date of Discharge: 09/02/2016 Attending Physician: Wayne Benson, *  Discharge Diagnosis: 1. Acute respiratory failure with hypoxia 2. Hypoglycemia in type 2 diabetes 3. ILD  Principal Problem:   Acute respiratory failure with hypoxia (HCC) Active Problems:   Esophageal varices in cirrhosis (HCC)   Tobacco abuse   Liver cirrhosis secondary to NASH (HCC)   Thrombocytopenia (HCC)   ILD (interstitial lung disease) (HCC)   Hypoglycemia   Discharge Medications: Allergies as of 09/02/2016      Reactions   Duloxetine Hcl Other (See Comments)      Medication List    STOP taking these medications   HUMULIN R 500 UNIT/ML injection Generic drug:  insulin regular human CONCENTRATED   predniSONE 20 MG tablet Commonly known as:  DELTASONE     TAKE these medications   acetaminophen 500 MG tablet Commonly known as:  TYLENOL Take 1,500 mg by mouth every 6 (six) hours as needed for mild pain.   albuterol 108 (90 Base) MCG/ACT inhaler Commonly known as:  PROVENTIL HFA;VENTOLIN HFA Inhale 2 puffs into the lungs every 6 (six) hours as needed for wheezing or shortness of breath.   cetirizine 10 MG tablet Commonly known as:  ZYRTEC Take 10 mg by mouth daily.   chlorpheniramine-HYDROcodone 10-8 MG/5ML Suer Commonly known as:  TUSSIONEX Take 5 mLs by mouth every 12 (twelve) hours as needed for cough.   cyanocobalamin 500 MCG tablet Take 500 mcg by mouth daily.   feeding supplement (GLUCERNA SHAKE) Liqd Take 237 mLs by mouth 3 (three) times daily between meals.   ferrous sulfate 325 (65 FE) MG tablet Take 325 mg by mouth daily with breakfast.   folic acid 1 MG tablet Commonly known as:  FOLVITE Take 1 mg by mouth daily.   gabapentin 300 MG capsule Commonly known as:  NEURONTIN Take 900 mg by mouth at bedtime.   guaiFENesin 600 MG 12  hr tablet Commonly known as:  MUCINEX Take 1 tablet (600 mg total) by mouth 2 (two) times daily.   insulin aspart 100 UNIT/ML injection Commonly known as:  novoLOG Inject 0-15 Units into the skin 3 (three) times daily with meals.   insulin aspart 100 UNIT/ML injection Commonly known as:  novoLOG Inject 0-5 Units into the skin at bedtime.   insulin aspart 100 UNIT/ML injection Commonly known as:  novoLOG Inject 15 Units into the skin 3 (three) times daily with meals.   insulin glargine 100 UNIT/ML injection Commonly known as:  LANTUS Inject 0.45 mLs (45 Units total) into the skin 2 (two) times daily.   ipratropium-albuterol 0.5-2.5 (3) MG/3ML Soln Commonly known as:  DUONEB Take 3 mLs by nebulization every 6 (six) hours as needed.   ipratropium-albuterol 0.5-2.5 (3) MG/3ML Soln Commonly known as:  DUONEB Take 3 mLs by nebulization every 6 (six) hours as needed.   nadolol 20 MG tablet Commonly known as:  CORGARD Take 20 mg by mouth daily.   ondansetron 8 MG tablet Commonly known as:  ZOFRAN Take 8 mg by mouth every 8 (eight) hours as needed for nausea or vomiting.   pantoprazole 40 MG tablet Commonly known as:  PROTONIX Take 40 mg by mouth 2 (two) times daily.   simvastatin 20 MG tablet Commonly known as:  ZOCOR Take 20 mg by mouth at bedtime.   zolpidem 5 MG tablet Commonly known as:  AMBIEN Take 5 mg by mouth at bedtime as needed for sleep.      Disposition and follow-up:   [name] was discharged from [facility] in stable condition.  At the hospital follow up visit please address:  1.  Acute Respiratory Failure: Required 4 L of oxygen via [place] continuously at time of discharge. Will need to re-assess his oxygen requirement as his functional status improves. He was tapered off of prednisone by time of discharge. If he were to develop SOB, could consider resuming these. He would benefit from pulmonology referral after discharge.   Type 2  Diabetes: The patient will require continued diabetic education. He was recently transitioned to LANTUS 45 UNITS BID + Novolog 15 units TID WC + moderate sliding scale. Please adjust Lantus as needed based on CBGs. Goal ~160-180 for now.   2.  Labs / imaging needed at time of follow-up: CBGs  3.  Pending labs/ test needing follow-up: none.  Follow-up Appointments: Follow-up Information    [facility] Schedule an appointment as soon as possible for a visit in 1 week(s).   Why:  Please make an appointment with [facility] within 1 week for hospital follow-up.  Contact information: [address] [facility] [place] [address] [phone]          Hospital Course by problem list: Principal Problem:   Acute respiratory failure with hypoxia [facility]) Active Problems:   Esophageal varices in cirrhosis (HCC)   Tobacco abuse   Liver cirrhosis secondary to NASH (HCC)   Thrombocytopenia (HCC)   ILD (interstitial lung disease) (HCC)   Hypoglycemia   1. Acute Respiratory Failure with Hypoxia, ILD [name] was discharged 08/22/16 after admission 4/17 for respiratory failure secondary to influenza pneumonia with superimposed bacterial infection, also complicated by DKA. At time of discharge he was saturating well on 1L via [place] continuously. [facility] which was set up before patient arrived at home. Unfortunately patient had used his [place] only attached to the humidifier and did not pick up his prednisone prescription after discharge. He presented with SOB which quickly resolved with 3L via O2 and prednisone. He was tapered of prednisone at time of discharge. While working with physical therapy, he became weak and desaturated to mid-80's while on 4L via [place]. They recommended SNF placement for rehabilitation, further education and close medical management.   2. Insulin Dependent Type 2 Diabetes with Hypoglycemia. The patient has been prescribed Humalin U-500 TID  for some time in the out patient setting. He was discharged home with his same home dose which he reported taking. He presented with hypoglycemia secondary to incorrect dosing of insulin at home. Usually he takes his own insulin and is prescribed 100 units with breakfast and dinner and 50 units at lunch. His wife gave him 100 units at lunch and CBG was 47 on admission. This quickly resolved and his CBGs were controlled during admission. He was transitioned to Lantus 45 units BID + Novolog 15 units TID WC + sliding scale with good control. He may still require additional insulin dose titration.   Discharge Vitals:   BP 136/73   Pulse 92   Temp 98 F (36.7 C) (Oral)   Resp 18   Ht 5\' 10"  (1.778 m)   Wt 195 lb (88.5 kg)   SpO2 99%   BMI 27.98 kg/m   Pertinent Labs, Studies, and Procedures:  CXR 5/4: Small BL pleural effusions. Chronic changes suggesting ILD.  Discharge Instructions: [name], you are being discharged to a skilled nursing facillity where you will receive rehabilitation to regain strength and independence. You will also be educated on your chronic medical conditions including diabetes and lung disease. You will need to follow-up with your primary care doctor after discharge.   SignedNoemi Chapel: Lasean Gorniak, DO 09/02/2016, 2:25 PM   Pager: [phone]

## 2016-08-29 LAB — GLUCOSE, CAPILLARY
GLUCOSE-CAPILLARY: 265 mg/dL — AB (ref 65–99)
GLUCOSE-CAPILLARY: 43 mg/dL — AB (ref 65–99)
GLUCOSE-CAPILLARY: 49 mg/dL — AB (ref 65–99)
GLUCOSE-CAPILLARY: 62 mg/dL — AB (ref 65–99)
Glucose-Capillary: 101 mg/dL — ABNORMAL HIGH (ref 65–99)
Glucose-Capillary: 131 mg/dL — ABNORMAL HIGH (ref 65–99)
Glucose-Capillary: 131 mg/dL — ABNORMAL HIGH (ref 65–99)
Glucose-Capillary: 157 mg/dL — ABNORMAL HIGH (ref 65–99)
Glucose-Capillary: 160 mg/dL — ABNORMAL HIGH (ref 65–99)
Glucose-Capillary: 165 mg/dL — ABNORMAL HIGH (ref 65–99)
Glucose-Capillary: 22 mg/dL — CL (ref 65–99)
Glucose-Capillary: 22 mg/dL — CL (ref 65–99)
Glucose-Capillary: 334 mg/dL — ABNORMAL HIGH (ref 65–99)
Glucose-Capillary: 71 mg/dL (ref 65–99)
Glucose-Capillary: 81 mg/dL (ref 65–99)

## 2016-08-29 LAB — HEPATITIS C ANTIBODY: HCV Ab: <0.1 s/co ratio (ref 0.0–0.9)

## 2016-08-29 MED ORDER — GLUCAGON HCL RDNA (DIAGNOSTIC) 1 MG IJ SOLR
INTRAMUSCULAR | Status: AC
  Filled 2016-08-29: qty 1

## 2016-08-29 MED ORDER — DEXTROSE 5 % IV SOLN: INTRAVENOUS | Status: DC

## 2016-08-29 MED ORDER — DEXTROSE 50 % IV SOLN
INTRAVENOUS | Status: AC
  Administered 2016-08-29: 50 mL
  Filled 2016-08-29: qty 50

## 2016-08-29 MED ORDER — INSULIN REGULAR HUMAN (CONC) 500 UNIT/ML ~~LOC~~ SOPN
15.0000 [IU] | PEN_INJECTOR | Freq: Three times a day (TID) | SUBCUTANEOUS | Status: DC
  Administered 2016-08-29: 15 [IU] via SUBCUTANEOUS

## 2016-08-29 MED ORDER — DEXTROSE 5 % IV SOLN
INTRAVENOUS | Status: DC
  Administered 2016-08-29: 22:00:00 via INTRAVENOUS

## 2016-08-29 MED ORDER — DEXTROSE 5 % IV SOLN
INTRAVENOUS | Status: DC
  Administered 2016-08-30: via INTRAVENOUS

## 2016-08-29 MED ORDER — GLUCOSE 40 % PO GEL
ORAL | Status: AC
  Administered 2016-08-29: 37.5 g
  Filled 2016-08-29: qty 1

## 2016-08-29 MED ORDER — INSULIN GLARGINE 100 UNIT/ML ~~LOC~~ SOLN
35.0000 [IU] | Freq: Two times a day (BID) | SUBCUTANEOUS | Status: DC
  Administered 2016-08-29: 35 [IU] via SUBCUTANEOUS
  Filled 2016-08-29 (×2): qty 0.35

## 2016-08-29 NOTE — Clinical Social Work Note (Signed)
CSW working on ST rehab placement for patient and clinicals for [facility] on Wednesday, 5/9. CSW will be contacted by [facility]. Update provided to MD.  Genelle BalVanessa Armend Hochstatter, MSW, LCSW Licensed Clinical Social Worker Clinical Social Work Department [facility] [phone]

## 2016-08-29 NOTE — Progress Notes (Signed)
Hypoglycemic Event  CBG: 22  Treatment: D50 IV 50 mL   Symptoms: Pale, Sweaty and Shaky  Follow-up CBG: Time:1944 CBG Result:101  Possible Reasons for Event: Inadequate meal intake and Medication regimen: 70 units of lantus at 0833 and 35 units of lantus at 1944  Comments/MD notified: Paged MD. Given verbal orders to hold lantus this evening and CBG monitoring Q1 until CBG maintained above 100.    Wayne Benson A Wayne Benson

## 2016-08-29 NOTE — Progress Notes (Signed)
PT Cancellation Note  Patient Details Name: Wayne Benson MRN: [id number] DOB: 1955/08/29   Cancelled Treatment:    Reason Eval/Treat Not Completed: Medical issues which prohibited therapy.  Had cannula out of his nose, pulse ox 85% and will let him recover given his very low readings with PT.  Will check back in a short time.   Ivar DrapeRuth E Virginie Josten 08/29/2016, 10:53 AM   Samul Dadauth Martese Vanatta, PT MS Acute Rehab Dept. Number: [facility] [phone] and [facility] [phone]

## 2016-08-29 NOTE — Progress Notes (Addendum)
Received a call that the patient was unresponsive and found to have CBG 22. He was immediately given an amp of D50 and returned to baseline mental status. CBG on recheck was 101. We saw him in the room and he was eating a sandwich and glucerna shake and described feeling diaphoretic but was alert and oriented. The long acting insulin that he received at 3:15 should peak at 9:15 so we will monitor his sugar closely this evening with CBG checks hourly. We will hold evening insulin for now.    ADDENDUM 08/29/2016  9:13 PM:  Paged again by RN for CBG 44. Reviewing her chart, he received both glargine 35 units around 3pm followed by humalin 15 units around 5 pm. Peak effect of glargine is 2-4 hours which when compounded with humalin's time to peak put us into the evening. As his CBGs have failed to improve over the last 2 hours into our shift, we will order for a short trial of D5 and monitor CBGs more frequently. I spoke with RN Manson Allanech Alisha who acknowledged our plan.  ADDENDUM 08/29/2016  10:05 PM:  I reassessed the patient at bedside. He is resting comfortably but awakens when I speak to him and feels better overall. He is oriented to place and year and can spell WORLD backwards as well as count backwards from 100. CBG resulted 81 amidst our efforts, so I spoke with RN Kami who agreed to recheck in 30 minutes to see if CBG is holding or dropping again.   ADDENDUM 08/29/2016  10:34 PM:  We were called again that CBG is now 62. He is still responding to questions albeit weaker. We will give another amp now and double his rate to D5W to 100cc/hr. We will recheck CBG in 30 minutes. We appreciate the effort and support put forth by both Lannette DonathKami and Helmut Musterlicia, and given his current clinical course, we want to ensure the availability of resources should he decompensate. We will transfer him to stepdown for tonight.  ADDENDUM 08/30/2016 4:15 AM:  We have been monitoring CBGs every 30 minutes and they have been greater than  170 x 3. His dose of regular insulin has worn off by now but glargine is still in effect. Will discontinue the dextrose drip at this time but continue CBG monitoring every 30 minutes for the next two hours. I have a low threshold to restart the dextrose drip if his insulin drifts lower than 100.

## 2016-08-29 NOTE — Progress Notes (Signed)
Hypoglycemic Event  CBG: 22  Treatment: D50 IV 50 mL  Symptoms: Pale and Sweaty  Follow-up CBG: Time: 1944 CBG Result:101  Possible Reasons for Event: Change in activity  Comments/MD notified:Paged teaching service, and we notified Dr. Ellis ParentsNew orders received to hold patients lantus this evening.      Monico HoarKellie L Demetre [place]

## 2016-08-29 NOTE — Progress Notes (Signed)
   08/29/16 1200  PT Visit Information  Last PT Received On 08/29/16  Assistance Needed +1  History of Present Illness [name] is a 61 year old man with cirrhosis secondary to NAFLD, esophageal varices, and insulin-dependent type 2 diabetes. He presented to the hospital with chief complaint of dyspnea.  Subjective Data  Subjective I cant believe how bad I feel  Patient Stated Goal get home and breathe better  Precautions  Precautions Fall  Precaution Comments ck O2 sats  Restrictions  Weight Bearing Restrictions No  Pain Assessment  Pain Assessment No/denies pain  Cognition  Arousal/Alertness Awake/alert  Behavior During Therapy WFL for tasks assessed/performed  Overall Cognitive Status Within Functional Limits for tasks assessed  General Comments has signficant drops in sats with all gait  Bed Mobility  Overal bed mobility Modified Independent  Bed Mobility Supine to Sit  General bed mobility comments uses hand rail and wearing O2  Transfers  Overall transfer level Modified independent  Equipment used None  Ambulation/Gait  Ambulation/Gait assistance Supervision;Min guard  Ambulation Distance (Feet) 80 Feet  Assistive device Rolling walker (2 wheeled)  Gait Pattern/deviations Step-through pattern;Narrow base of support;Trunk flexed;Decreased stride length  General Gait Details reminders for pacing and had to increase to 3 L O2 due to dropped O2 sats  Gait velocity reduced  Gait velocity interpretation Below normal speed for age/gender  General stair comments deferred due to short gait dropping O2 sat to 81%  Balance  Overall balance assessment Needs assistance  Sitting-balance support Feet supported  Sitting balance-Leahy Scale Good  Standing balance-Leahy Scale Fair  PT - End of Session  Equipment Utilized During Treatment Gait belt;Oxygen  Activity Tolerance Patient tolerated treatment well  Patient left in chair;with call bell/phone within reach  Nurse Communication  Mobility status  PT - Assessment/Plan  PT Plan Current plan remains appropriate  PT Visit Diagnosis Other (comment) (decreased functional capacity)  PT Frequency (ACUTE ONLY) Min 3X/week  Recommendations for Other Services OT consult  Follow Up Recommendations SNF  PT equipment None recommended by PT  AM-PAC PT "6 Clicks" Daily Activity Outcome Measure  Difficulty turning over in bed (including adjusting bedclothes, sheets and blankets)? 4  Difficulty moving from lying on back to sitting on the side of the bed?  4  Difficulty sitting down on and standing up from a chair with arms (e.g., wheelchair, bedside commode, etc,.)? 4  Help needed moving to and from a bed to chair (including a wheelchair)? 4  Help needed walking in hospital room? 3  Help needed climbing 3-5 steps with a railing?  3  6 Click Score 22  Mobility G Code  CJ  PT Goal Progression  Progress towards PT goals Progressing toward goals  PT Time Calculation  PT Start Time (ACUTE ONLY) 1112  PT Stop Time (ACUTE ONLY) 1149  PT Time Calculation (min) (ACUTE ONLY) 37 min  PT G-Codes **NOT FOR INPATIENT CLASS**  Functional Assessment Tool Used AM-PAC 6 Clicks Basic Mobility  PT General Charges  $$ ACUTE PT VISIT 1 Procedure  PT Treatments  $Gait Training 8-22 mins  $Therapeutic Activity 8-22 mins  Wayne Benson, PT MS Acute Rehab Dept. Number: [facility] [id number] and [facility] [phone]

## 2016-08-29 NOTE — Progress Notes (Signed)
Internal Medicine Attending:   I saw and examined the patient. I reviewed the resident's note and I agree with the resident's findings and plan as documented in the resident's note.  61 year old man admitted with acute on chronic hypoxic respiratory failure likely due to interstitial lung disease. He is doing well today, dry nasal membranes due to on humidified supplemental oxygen. We got respiratory therapy to provide a water bottle to his oxygen line. He is [place] [facility] [place] [facility] which is taking more time than usual.

## 2016-08-29 NOTE — Progress Notes (Signed)
Hypoglycemic Event  CBG: 62  Treatment: D50 IV 50 mL  Symptoms: Pale and Sweaty  Follow-up CBG: Time:2253 CBG Result:165  Possible Reasons for Event: Medication regimen: Insulin  Comments/MD notified:Page to Dr. Obie DredgeBlum with call back, Plan to transfer pt to stepdown    Wayne Benson A Tea Collums

## 2016-08-29 NOTE — Progress Notes (Signed)
Hypoglycemic Event  CBG: 49  Treatment: 1 tube instant glucose  Symptoms: Pale, Sweaty and Shaky  Follow-up CBG: Time:2113 CBG Result:43   Possible Reasons for Event: Medication regimen: Insulin dosage change  Comments/MD notified:Dr. Obie DredgeBlum  Hypoglycemic Event  CBG: 43  Treatment: D50 IV 50 mL  Symptoms: Pale and Sweaty  Follow-up CBG: Time: 2126  CBG Result:149  Possible Reasons for Event: Medication regimen: Insulin  Comments/MD notified: Dr. Obie DredgeBlum Monitor CBGs every 30 mins    Wayne Benson Wayne Benson Wayne Benson

## 2016-08-29 NOTE — Progress Notes (Signed)
Inpatient Diabetes Program Recommendations  AACE/ADA: New Consensus Statement on Inpatient Glycemic Control (2015)  Target Ranges:  Prepandial:   less than 140 mg/dL      Peak postprandial:   less than 180 mg/dL (1-2 hours)      Critically ill patients:  140 - 180 mg/dL   Results for Anselm JunglingELSON, Brook L (MRN [id number]) as of 08/29/2016 13:24  Ref. Range 08/29/2016 08:03 08/29/2016 12:27  Glucose-Capillary Latest Ref Range: 65 - 99 mg/dL 409265 (H) 811334 (H)   Review of Glycemic Control  Inpatient Diabetes Program Recommendations:     Glucose spiked significantly at lunch after Nepro shake. Patient eating 100% of meal consider d/cing the Nepro shake.  Thanks,  Christena DeemShannon Cosima Prentiss RN, MSN, [facility] Inpatient Diabetes Coordinator Team Pager [phone] (8a-5p)

## 2016-08-29 NOTE — Progress Notes (Signed)
   Subjective: Seen and evaluated at bedside. No complaints presently other than nasal irritation from oxygen. Reports coughing up mucous. Still dyspneic with ambulation but well at rest.  Objective:  Vital signs in last 24 hours: Vitals:   08/28/16 0928 08/28/16 1724 08/28/16 2043 08/29/16 0408  BP: 136/73 (!) 135/56 134/82 124/71  Pulse: 92 85 87 78  Resp: 18 18 18 17   Temp: 98 F (36.7 C) 98 F (36.7 C) 97.8 F (36.6 C) 98.3 F (36.8 C)  TempSrc: Oral Oral Oral Oral  SpO2: 99% 99% 90% 90%  Weight:      Height:       GENERAL- Alert and interactive. Sitting up in bed without distress. Just finished breakfast.   CARDIAC- RRR without mgr.  RESP- CTA BL with normal WOB. On 3L via [employer] and able to speak in complete sentences without dyspnea. EXTREMITIES- Warm, well perfused. No peripheral edema appreciated on BL LE. SKIN- Warm, dry and without cyanosis or jaundice.   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 Comfortable at rest on 2L via [place] however still endorses DOE. Feels like chest congestion is improving and reports coughing up mucous. Complains of some nasal irritation from [place]. Working with CSW on SNF placement.  -Tussionex + Duonebs PRN -Mucinex -CSW working on placement, appreciate assistance.  -2L via [place] currently. Continue working with PT on deconditioning and improving exertional tolerance.   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 Believe patient would benefit from basal insulin however currently on U-500. Discussing with pharmacy possibility of transition.  -Increased yesterday: humulin to 70 units Qbreakfast and Qdinner, 40 units Qlunch -Hyperglycemia exacerbated by prednisone, expect improvement with discontinuation.   Thrombocytopenia: 2/2 liver cirrhosis. SCDs for  VTE ppx.  Dispo: Anticipated discharge whenever SNF bed available.  Wayne ChapelBethany Jadon Harbaugh, DO Internal Medicine Resident, PGY1 Pager# [phone] 08/29/2016, 7:52 AM

## 2016-08-30 ENCOUNTER — Encounter (HOSPITAL_COMMUNITY): Payer: Self-pay

## 2016-08-30 LAB — BASIC METABOLIC PANEL
ANION GAP: 9 (ref 5–15)
BUN: 14 mg/dL (ref 6–20)
CHLORIDE: 100 mmol/L — AB (ref 101–111)
CO2: 28 mmol/L (ref 22–32)
Calcium: 8.8 mg/dL — ABNORMAL LOW (ref 8.9–10.3)
Creatinine, Ser: 0.67 mg/dL (ref 0.61–1.24)
Glucose, Bld: 217 mg/dL — ABNORMAL HIGH (ref 65–99)
POTASSIUM: 4.3 mmol/L (ref 3.5–5.1)
SODIUM: 137 mmol/L (ref 135–145)

## 2016-08-30 LAB — GLUCOSE, CAPILLARY
GLUCOSE-CAPILLARY: 144 mg/dL — AB (ref 65–99)
GLUCOSE-CAPILLARY: 143 mg/dL — AB (ref 65–99)
GLUCOSE-CAPILLARY: 211 mg/dL — AB (ref 65–99)
Glucose-Capillary: 143 mg/dL — ABNORMAL HIGH (ref 65–99)
Glucose-Capillary: 145 mg/dL — ABNORMAL HIGH (ref 65–99)
Glucose-Capillary: 159 mg/dL — ABNORMAL HIGH (ref 65–99)
Glucose-Capillary: 162 mg/dL — ABNORMAL HIGH (ref 65–99)
Glucose-Capillary: 171 mg/dL — ABNORMAL HIGH (ref 65–99)
Glucose-Capillary: 192 mg/dL — ABNORMAL HIGH (ref 65–99)
Glucose-Capillary: 194 mg/dL — ABNORMAL HIGH (ref 65–99)
Glucose-Capillary: 196 mg/dL — ABNORMAL HIGH (ref 65–99)
Glucose-Capillary: 338 mg/dL — ABNORMAL HIGH (ref 65–99)
Glucose-Capillary: 338 mg/dL — ABNORMAL HIGH (ref 65–99)
Glucose-Capillary: 353 mg/dL — ABNORMAL HIGH (ref 65–99)

## 2016-08-30 LAB — MRSA PCR SCREENING: MRSA BY PCR: NEGATIVE

## 2016-08-30 MED ORDER — LIVING WELL WITH DIABETES BOOK
Freq: Once | Status: AC
Start: 2016-08-30 — End: 2016-08-30
  Administered 2016-08-30: 14:00:00
  Filled 2016-08-30: qty 1

## 2016-08-30 MED ORDER — INSULIN ASPART 100 UNIT/ML ~~LOC~~ SOLN: 0.0000 [IU] | Freq: Three times a day (TID) | SUBCUTANEOUS | Status: DC

## 2016-08-30 MED ORDER — INSULIN ASPART 100 UNIT/ML ~~LOC~~ SOLN
10.0000 [IU] | Freq: Three times a day (TID) | SUBCUTANEOUS | Status: DC
  Administered 2016-08-30 – 2016-09-02 (×8): 10 [IU] via SUBCUTANEOUS

## 2016-08-30 MED ORDER — INSULIN ASPART 100 UNIT/ML ~~LOC~~ SOLN
5.0000 [IU] | Freq: Three times a day (TID) | SUBCUTANEOUS | Status: DC
  Administered 2016-08-30: 5 [IU] via SUBCUTANEOUS

## 2016-08-30 MED ORDER — INSULIN GLARGINE 100 UNIT/ML ~~LOC~~ SOLN
35.0000 [IU] | Freq: Two times a day (BID) | SUBCUTANEOUS | Status: DC
  Administered 2016-08-30 – 2016-08-31 (×3): 35 [IU] via SUBCUTANEOUS
  Filled 2016-08-30 (×3): qty 0.35

## 2016-08-30 MED ORDER — INSULIN ASPART 100 UNIT/ML ~~LOC~~ SOLN
0.0000 [IU] | Freq: Three times a day (TID) | SUBCUTANEOUS | Status: DC
  Administered 2016-08-30: 11 [IU] via SUBCUTANEOUS
  Administered 2016-08-31: 8 [IU] via SUBCUTANEOUS
  Administered 2016-08-31: 3 [IU] via SUBCUTANEOUS
  Administered 2016-08-31 – 2016-09-01 (×2): 8 [IU] via SUBCUTANEOUS
  Administered 2016-09-01: 5 [IU] via SUBCUTANEOUS
  Administered 2016-09-01: 3 [IU] via SUBCUTANEOUS
  Administered 2016-09-02: 5 [IU] via SUBCUTANEOUS
  Administered 2016-09-02: 8 [IU] via SUBCUTANEOUS

## 2016-08-30 MED ORDER — INSULIN ASPART 100 UNIT/ML ~~LOC~~ SOLN
0.0000 [IU] | Freq: Every day | SUBCUTANEOUS | Status: DC
Start: 2016-08-30 — End: 2016-09-02
  Administered 2016-09-01: 2 [IU] via SUBCUTANEOUS

## 2016-08-30 NOTE — Clinical Social Work Placement (Addendum)
   CLINICAL SOCIAL WORK PLACEMENT  NOTE  Date:  08/30/2016  Patient Details  Name: Wayne Benson MRN: [id number] Date of Birth: [birth year]  Clinical Social Work is seeking post-discharge placement for this patient at the Skilled  Nursing Facility level of care (*CSW will initial, date and re-position this form in  chart as items are completed):  No   Patient/family provided with [facility] Clinical Social Work Department's list of facilities offering this level of care within the geographic area requested by the patient (or if unable, by the patient's family).  No   Patient/family informed of their freedom to choose among providers that offer the needed level of care, that participate in Medicare, Medicaid or managed care program needed by the patient, have an available bed and are willing to accept the patient.  No   Patient/family informed of [facility]'s ownership interest in [facility] and [facility], as well as of the fact that they are under no obligation to receive care at these facilities.  PASRR submitted to EDS on 08/27/16     PASRR number received on 08/28/16     Existing PASRR number confirmed on       FL2 transmitted to all facilities in geographic area requested by pt/family on 08/30/16 - See 5/11 note regarding VA facilities.  FL2 transmitted to all facilities within larger geographic area on  See 5/11 note re: VA facilities.     Patient informed that his/her managed care company has contracts with or will negotiate with certain facilities, including the following:            Patient/family informed of bed offers received.  Patient chooses bed at [facility]     Physician recommends and patient chooses bed at      [facility] on 09/[facility]/16.  Patient to be transferred to facility by [facility]     Patient family notified on 09/02/16 of transfer.  Name of family member notified:  Patient and Olegario MessierKathy, spouse      PHYSICIAN       Additional Comment:    _______________________________________________ Cristobal Goldmannrawford, Wayne Bradley, LCSW 08/30/2016, 7:18 PM

## 2016-08-30 NOTE — Clinical Social Work Note (Signed)
CSW received voice mail from [place] [facility] advising [facility]W that patient has a 32-day contract for short-term rehab and was given name of 4 facilities to contact:  [facility] [place] [phone]). Call made to facility and per admissions director Wayne Benson, no VA beds open and she currently has a 10 person waiting list.  2. [facility] [place]: [phone]): Talked with Wayne Benson, admissions director and she requested clinicals be sent. Clinicals faxed [phone]).  3. [facility] [place]: [phone]): Admissions person gone for the day. CSW left a detailed message and clinicals faxed [phone]).  4[facility] [place]: [phone]): Talked with Wayne Benson, admissions director and she requested clinicals be sent. Clinicals faxed [phone]).  CSW will follow-up with facilities next week to determine if any of them can accepyt patient.  Wayne Benson, MSW, LCSW Licensed Clinical Social Worker Clinical Social Work Department [facility] [phone]

## 2016-08-30 NOTE — Clinical Social Work Note (Signed)
Clinical Social Work Assessment  Patient Details  Name: Wayne Benson MRN: [id number] Date of Birth: 10/25/1955  Date of referral:  08/26/16               Reason for consult:  Facility Placement                Permission sought to share information with:  Family Supports Permission granted to share information::  Yes, Verbal Permission Granted  Name::     Gayla DossCathy Craney  Agency::     Relationship::  Wife  Contact Information:  [phone] (home) and [phone] (mobile)  Housing/Transportation Living arrangements for the past 2 months:  Apartment Source of Information:  Patient, Spouse Patient Interpreter Needed:  None Criminal Activity/Legal Involvement Pertinent to Current Situation/Hospitalization:  No - Comment as needed Significant Relationships:    Lives with:  Spouse Do you feel safe going back to the place where you live?  No (Patient and wife agree that patient needs rehab before coming home) Need for family participation in patient care:  Yes (Comment)  Care giving concerns:  CSW talked with patient on 5/8 and with patient and wife on 08/28/16 regarding discharge disposition and both agree that patient needs rehab before returning home.  Social Worker assessment / plan:  On 5/9 CSW talked with patient and wife Lynden AngCathy regarding discharge disposition to a VA contracted SNF. Wife advised of process that had taken place and [place] hear from [place] regarding [place]'s [place] contracted [place]. Mrs. Delton Seeelson reported that she has her own health issues, is sole caretaker for her mother (sister deceased) and cannot also care for her husband at this time. Wife explained that her mom lives in a house and they live in a mobile home on the property.    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  Patient/Family's Understanding of and Emotional Response to Diagnosis, Current Treatment, and Prognosis:  Not discussed.  Emotional Assessment Appearance:  Appears older than stated age Attitude/Demeanor/Rapport:  Other (Appropriate) Affect (typically observed):  Appropriate, Pleasant Orientation:  Oriented to Place, Oriented to  Time, Oriented to Self, Oriented to Situation Alcohol / Substance use:  Tobacco Use, Alcohol Use, Illicit Drugs (Patient reported that he quit smoking, does not drink or use illicit drugs) Psych involvement (Current and /or in the community):  No (Comment)  Discharge Needs  Concerns to be addressed:  Discharge Planning Concerns Readmission within the last 30 days:  Yes Current discharge risk:  None Barriers to Discharge:  VA SNF Placement  Cristobal Goldmannrawford, Khali Albanese Bradley, LCSW 08/30/2016, 7:10 PM

## 2016-08-30 NOTE — Clinical Social Work Note (Addendum)
Call made to [facility] [phone], ext. [phone]) and message left regarding patient and [facility] on 08/28/16.  Genelle BalVanessa Thelmer Legler, MSW, LCSW Licensed Clinical Social Worker Clinical Social Work Department [facility] [phone]

## 2016-08-30 NOTE — Clinical Social Work Note (Signed)
[name]'s PASRR number: [id number] [id number], effective 5/9 - 09/27/16.   [name], MSW, LCSW Licensed Clinical Social Worker Clinical Social Work Department [facility] [phone]

## 2016-08-30 NOTE — Progress Notes (Signed)
   Subjective: Seen and evaluated at bedside. Was transferred to SDU for observation overnight as he became hypoglycemic. Reports he felt weak, sweaty and had never felt that way before. Feels back to normal now. Denied any SOB.   Objective:  Vital signs in last 24 hours: Vitals:   08/30/16 0700 08/30/16 0731 08/30/16 0733 08/30/16 1000  BP: 113/66  122/84 100/71  Pulse: 84  77 85  Resp: 20  (!) 27 20  Temp:   98 F (36.7 C)   TempSrc:  Oral Oral   SpO2: 92%  92% 94%  Weight:      Height:       GENERAL- Alert and interactive. In bed and in no distress.  CARDIAC- RRR without mgr.  RESP- CTA BL with normal WOB. Speaking in complete sentences without dyspnea. EXTREMITIES- Warm, well perfused. No peripheral edema appreciated on BL LE. SKIN- Warm, dry and without cyanosis or jaundice.   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 Saturating well on O2 via [place]. No SOB at rest however still weak and deconditioned. Working with CSW on SNF placement.  -Tussionex + Duonebs PRN -Mucinex -CSW working on placement, appreciate assistance.  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 Became hypoglycemic yesterday evening however improved with D5 and D50. Was transferred to SDU for closer observation overnight. CBGs now in 200's and he will be transferred back to regular floor today.  -Lantus 35 units BID -Novolog 5 units TID WC -Sensitive sliding-scale  Thrombocytopenia, Cirrhosis: 2/2 liver cirrhosis. SCDs for VTE ppx.  Dispo: Anticipated discharge whenever SNF bed available.  Noemi ChapelBethany Kalifa Cadden, DO Internal Medicine Resident, PGY1 Pager# [phone] 08/30/2016, 7:57 AM

## 2016-08-30 NOTE — Progress Notes (Signed)
Inpatient Diabetes Program Recommendations  AACE/ADA: New Consensus Statement on Inpatient Glycemic Control (2015)  Target Ranges:  Prepandial:   less than 140 mg/dL      Peak postprandial:   less than 180 mg/dL (1-2 hours)      Critically ill patients:  140 - 180 mg/dL   Spoke with patient about diabetes and home regimen for diabetes control. Patient reports [facility]. Patient reports that he mostly takes his Humulin U-500 insulin once a day (Supper time dose because he likes to sleep in but then runs errands around lunch time). Patient reports that He was changed to U-500 10 months ago and was on Lantus and Novolog SSI regimen before that. He said his doctor wanted him to follow up in a year with no rechecks in between. Patient stated that he didn't think he needed to check glucose levels because he was no longer on a sliding scale but on a set dose on insulin. Patient reports that he had an A1c at 8% before. Discussed A1C results (12% on 08/06/16 ). Discussed glucose and A1C goals. Discussed importance of checking CBGs and maintaining good CBG control to prevent long-term and short-term complications and to determine if insulin dosing is needed. Discussed impact of nutrition, exercise, stress, sickness, and medications on diabetes control. Patient reports noting trends before and after he walks significantly reduce from 200-300 range down to 100's. Discussed carbohydrates, carbohydrate goals per day and meal, along with portion sizes. Patient reports loosing weight. Patient also reports that when he got the FLU he was unable to even get administer his own insulin so that why his wife gave him doses he usually did not take. Encouraged patient to check glucose 4 times per day (before meals and at bedtime). Patient verbalized understanding of information discussed and he states that he has no further questions at this time related to diabetes.  Spoke to patient  about watching the DM videos with his wife and looking over the following materials that was given: Diabetes Meal Planning guide Hypo/Hyperglycemia handout with s/s and treatment Your Diabetes Game Plan booklet about exercise and diet  Will Follow trends closely and follow up with patient with education while here.   Thanks,  Christena DeemShannon Maribell Demeo RN, MSN, [facility] Inpatient Diabetes Coordinator Team Pager [phone] (8a-5p)

## 2016-08-30 NOTE — Progress Notes (Signed)
Pt transported to 4W via bed without incident. Report given to [facility]. Called wife to update her on pt transfer of care.

## 2016-08-31 LAB — GLUCOSE, CAPILLARY
GLUCOSE-CAPILLARY: 262 mg/dL — AB (ref 65–99)
GLUCOSE-CAPILLARY: 163 mg/dL — AB (ref 65–99)
Glucose-Capillary: 251 mg/dL — ABNORMAL HIGH (ref 65–99)
Glucose-Capillary: 154 mg/dL — ABNORMAL HIGH (ref 65–99)

## 2016-08-31 MED ORDER — INSULIN GLARGINE 100 UNIT/ML ~~LOC~~ SOLN
40.0000 [IU] | Freq: Two times a day (BID) | SUBCUTANEOUS | Status: DC
  Administered 2016-08-31 – 2016-09-02 (×4): 40 [IU] via SUBCUTANEOUS
  Filled 2016-08-31 (×4): qty 0.4

## 2016-08-31 MED ORDER — PHENOL 1.4 % MT LIQD: 1.0000 | OROMUCOSAL | Status: DC | PRN

## 2016-08-31 MED ORDER — INSULIN GLARGINE 100 UNIT/ML ~~LOC~~ SOLN: 45.0000 [IU] | Freq: Two times a day (BID) | SUBCUTANEOUS | Status: DC

## 2016-08-31 NOTE — Progress Notes (Signed)
   Subjective: Seen and evaluated at bedside. Breathing stable although notes someone bumped-up his oxygen overnight. Reports sore throat from coughing. Bringing up phlegm. No fevers or chills. Eating and drinking well.   Objective:  Vital signs in last 24 hours: Vitals:   08/30/16 1200 08/30/16 1459 08/30/16 2023 08/31/16 0529  BP: 127/80 122/74 131/85 140/67  Pulse: 76 81 79 69  Resp: (!) 23 (!) 24 (!) 22 (!) 21  Temp:  99.3 F (37.4 C) 98.5 F (36.9 C) 97.7 F (36.5 C)  TempSrc:  Oral Oral Oral  SpO2: 92% 94% 93% 93%  Weight:      Height:  5\' 10"  (1.778 m)     GENERAL- Alert and interactive. Resting comfortably in bed without distress.  CARDIAC- RRR without mgr.  RESP- Faint scattered crackles however with good air movement throughout. On 4L via [place]. Able to speak in complete sentences.  EXTREMITIES- Warm, well perfused. No peripheral edema appreciated on BL LE. SKIN- Warm, dry. No cyanosis or jaundice.   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 Patient appears comfortable on 4L during evaluation. Lungs with good air movement but with some faint scattered crackles. Likely atelectasis in this patient with prolonged hospitalization however will continue to monitor. No fever.  -Supplemental O2 to keep O2 sats 88-92% -Encouraged patient to eat all meals in recliner and ambulate as tolerated -Continue incentive spirometry -Tussionex + Duonebs PRN -Mucinex -CSW working on placement, appreciate assistance.  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 So far requiring minimal sliding scale. Appears there is an issue with EMR and CBGs - discussed with RN that pts CBG was 250 at 8am.  -CBG QID WC + HS -Lantus 35 units  BID -Novolog 10 units TID WC  Thrombocytopenia, Cirrhosis: SCDs for VTE ppx.  Dispo: Anticipated discharge whenever SNF bed available.  Noemi ChapelBethany Ahriyah Vannest, DO Internal Medicine Resident, PGY1 Pager# [phone] 08/31/2016, 10:08 AM

## 2016-09-01 LAB — GLUCOSE, CAPILLARY
GLUCOSE-CAPILLARY: 269 mg/dL — AB (ref 65–99)
GLUCOSE-CAPILLARY: 243 mg/dL — AB (ref 65–99)
Glucose-Capillary: 227 mg/dL — ABNORMAL HIGH (ref 65–99)
Glucose-Capillary: 172 mg/dL — ABNORMAL HIGH (ref 65–99)

## 2016-09-01 MED ORDER — IPRATROPIUM-ALBUTEROL 0.5-2.5 (3) MG/3ML IN SOLN
3.0000 mL | Freq: Two times a day (BID) | RESPIRATORY_TRACT | Status: DC
  Administered 2016-09-02: 3 mL via RESPIRATORY_TRACT
  Filled 2016-09-01: qty 3

## 2016-09-01 NOTE — Progress Notes (Signed)
   Subjective: He is resting comfortably this morning. He is coughing without much sputum production. His rate of supplemental oxygen is decreased this morning. He got out of bed but says only infrequently due to not much available help. He denies any fevers, chills, or chest pains.  Objective:  Vital signs in last 24 hours: Vitals:   08/31/16 0529 08/31/16 1334 08/31/16 2007 09/01/16 0415  BP: 140/67 122/67 133/85 136/78  Pulse: 69 77 88 87  Resp: (!) 21 20 19 19   Temp: 97.7 F (36.5 C) 97.6 F (36.4 C) 98.2 F (36.8 C) 98.1 F (36.7 C)  TempSrc: Oral Oral Oral Oral  SpO2: 93% 95% 95% 95%  Weight:      Height:       GENERAL- Alert and interactive. Resting comfortably in bed without distress CARDIAC- RRR without mgr RESP- Faint wheezes L>R, good air movement, normal WOB on 3L via [place]. EXTREMITIES- Warm, well perfused. No peripheral edema SKIN- Warm, dry. 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 Coughing continues to be his main respiratory complaint rather than dyspnea. With few wheezes I will schedule some intermittent SABA/SAMA nebulizer treatments and see how much they impact his symptoms. -Supplemental O2 to keep O2 sats 88-92% -Encouraged patient to eat all meals in recliner and ambulate as tolerated -Continue incentive spirometry -Tussionex + Duonebs  -Mucinex -CSW working on placement, appreciate assistance.  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 He was started on a higher dose of basal insulin last night so we will see his requirements today and adjust accordingly. -CBG QID WC + HS -Lantus 40 units BID -Novolog 10 units TID WC   Dispo: Anticipated discharge whenever SNF bed available.  Fuller Planhristopher W Fallen Crisostomo, MD PGY-II Internal Medicine Resident Pager#  [phone] 09/01/2016, 11:47 AM

## 2016-09-02 LAB — GLUCOSE, CAPILLARY
GLUCOSE-CAPILLARY: 203 mg/dL — AB (ref 65–99)
GLUCOSE-CAPILLARY: 213 mg/dL — AB (ref 65–99)
Glucose-Capillary: 270 mg/dL — ABNORMAL HIGH (ref 65–99)

## 2016-09-02 MED ORDER — IPRATROPIUM-ALBUTEROL 0.5-2.5 (3) MG/3ML IN SOLN: 3.0000 mL | Freq: Four times a day (QID) | RESPIRATORY_TRACT | 0 refills | Status: AC | PRN

## 2016-09-02 MED ORDER — INSULIN ASPART 100 UNIT/ML ~~LOC~~ SOLN: 15.0000 [IU] | Freq: Three times a day (TID) | SUBCUTANEOUS | 11 refills | Status: DC

## 2016-09-02 MED ORDER — INSULIN GLARGINE 100 UNIT/ML ~~LOC~~ SOLN
45.0000 [IU] | Freq: Two times a day (BID) | SUBCUTANEOUS | Status: DC
  Filled 2016-09-02: qty 0.45

## 2016-09-02 MED ORDER — INSULIN ASPART 100 UNIT/ML ~~LOC~~ SOLN
15.0000 [IU] | Freq: Three times a day (TID) | SUBCUTANEOUS | Status: DC
  Administered 2016-09-02: 15 [IU] via SUBCUTANEOUS

## 2016-09-02 MED ORDER — INSULIN ASPART 100 UNIT/ML ~~LOC~~ SOLN: 0.0000 [IU] | Freq: Every day | SUBCUTANEOUS | 11 refills | Status: DC

## 2016-09-02 MED ORDER — INSULIN GLARGINE 100 UNIT/ML ~~LOC~~ SOLN: 45.0000 [IU] | Freq: Two times a day (BID) | SUBCUTANEOUS | 11 refills | Status: DC

## 2016-09-02 MED ORDER — INSULIN ASPART 100 UNIT/ML ~~LOC~~ SOLN: 0.0000 [IU] | Freq: Three times a day (TID) | SUBCUTANEOUS | 11 refills | Status: DC

## 2016-09-02 NOTE — Clinical Social Work Note (Signed)
Pt has selected a bed at [facility] [place]. Pt is satisfied with facility, as his brother has been there in the past. CSW will follow up with facility.   CooperstownBridget Viaan Knippenberg, [place] [phone]

## 2016-09-02 NOTE — Clinical Social Work Note (Signed)
Clinical Social Worker facilitated patient discharge including contacting patient family and facility to confirm patient discharge plans.  Clinical information faxed to facility and family agreeable with plan.  CSW arranged ambulance transport via [facility] to [facility] [place] .  RN to call [phone] for report prior to discharge.  Clinical Social Worker will sign off for now as social work intervention is no longer needed. Please consult us again if new need arises.  HolsteinBridget Teriana Danker, [place] [phone]

## 2016-09-02 NOTE — Progress Notes (Addendum)
   Subjective:  [name] was seen and evaluated this morning at bedside. Pt reports he feels SOB when he goes from laying flat to sitting upright and feels a little SOB at rest while upright. Eating and drinking OK. No PND.   Objective:  Vital signs in last 24 hours: Vitals:   09/01/16 1438 09/01/16 2147 09/02/16 0514 09/02/16 0935  BP: 133/73 125/68 (!) 153/91   Pulse: 90 78 79   Resp: 18 18 18    Temp: 98.4 F (36.9 C) 98.1 F (36.7 C) 97.8 F (36.6 C)   TempSrc: Oral Oral Oral   SpO2: 95% 94% 94% 94%  Weight:      Height:       GENERAL- Alert and interactive. Sitting upright in bed eating breakfast without distress. Appears a little fatigued.  CARDIAC- RRR without mgr RESP- CTA BL. Normal WOB on 4L O2.  EXTREMITIES- Warm, well perfused. No peripheral edema SKIN- Warm, dry. 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 Now on 4L and saturating mid 90's with minimal dyspnea. Was started on scheduled duonebs yesterday due to wheezing and lungs clear today. -Supplemental O2 to keep sats 88-92% -Scheduled duonebs, mucinex and Tussionex PRN.  -Pt to continue working with PT. Encouraged ambulation about room and to eat all meals in recliner if possible.  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 Required 136 units total yesterday. Will increase Mealtime to 15 units TID WC and also increase lantus to 45 units BID.  -CBG QID WC + HS -Lantus 45 units BID -Novolog 15 units TID WC -Carb mod diet -Gabapentin 900mg  QHS  Dispo: Anticipated discharge whenever SNF bed available.  Wayne ChapelBethany Jerris Fleer, DO PGY-I Internal Medicine Resident Pager# [phone] 09/02/2016, 10:19 AM

## 2016-09-02 NOTE — Progress Notes (Signed)
Physical Therapy Treatment Patient Details Name: Wayne Benson MRN: [id number] DOB: [birth year] Today's Date: 09/02/2016    History of Present Illness Wayne Benson is a 61 year old man with cirrhosis secondary to NAFLD, esophageal varices, and insulin-dependent type 2 diabetes. He presented to the hospital with chief complaint of dyspnea.    PT Comments    Pt admitted with above diagnosis. Pt currently with functional limitations due to balance and endurance deficits. Pt was able to ambulate in halls with supervision with cues for pursed lip breathing.  Needed one seated rest break.  Desats with activity.  Pt will benefit from skilled PT to increase their independence and safety with mobility to allow discharge to the venue listed below.     Follow Up Recommendations  SNF     Equipment Recommendations  None recommended by PT    Recommendations for Other Services OT consult     Precautions / Restrictions Precautions Precautions: Fall Precaution Comments: check O2 sats Restrictions Weight Bearing Restrictions: No    Mobility  Bed Mobility Overal bed mobility: Modified Independent Bed Mobility: Supine to Sit     Supine to sit: Modified independent (Device/Increase time)     General bed mobility comments: uses hand rail and wearing O2  Transfers Overall transfer level: Independent Equipment used: None   Sit to Stand: Independent         General transfer comment: No issues getting up from bed or chair.   Ambulation/Gait Ambulation/Gait assistance: Supervision Ambulation Distance (Feet): 150 Feet (75 feet x 2) Assistive device: None Gait Pattern/deviations: Step-through pattern;Decreased stride length Gait velocity: reduced Gait velocity interpretation: Below normal speed for age/gender General Gait Details: Pt on 4L initially.  Had to increase to 5L O2 due to dropped O2 sats in low 80's.  Stayed in high 80's to 90's on 5L with activity. Had to have one seated rest  break which was about 10 minutes with pursed lip breathing.    Stairs         General stair comments: deferred due to dropping O2 sat to 81%  Wheelchair Mobility    Modified Rankin (Stroke Patients Only)       Balance Overall balance assessment: Needs assistance Sitting-balance support: Feet supported;No upper extremity supported Sitting balance-Leahy Scale: Good     Standing balance support: No upper extremity supported;During functional activity Standing balance-Leahy Scale: Fair Standing balance comment: can stand statically without support.  Was able to clean his bottom without assist as he was on toilet on arrival.                             Cognition Arousal/Alertness: Awake/alert Behavior During Therapy: WFL for tasks assessed/performed Overall Cognitive Status: Within Functional Limits for tasks assessed Area of Impairment: Memory                     Memory: Decreased short-term memory   Safety/Judgement: Decreased awareness of deficits     General Comments: has signficant drops in sats with all gait      Exercises      General Comments General comments (skin integrity, edema, etc.): O2 on 4L at rest 90-92%.  O2 on 4L 81-88%.  Had to incr O2 to 5L and sats 84-94%.  Reviewed incentive spirometer.      Pertinent Vitals/Pain Pain Assessment: No/denies pain    Home Living  Prior Function            PT Goals (current goals can now be found in the care plan section) Progress towards PT goals: Progressing toward goals    Frequency    Min 3X/week      PT Plan Current plan remains appropriate    Co-evaluation              AM-PAC PT "6 Clicks" Daily Activity  Outcome Measure  Difficulty turning over in bed (including adjusting bedclothes, sheets and blankets)?: None Difficulty moving from lying on back to sitting on the side of the bed? : None Difficulty sitting down on and standing up  from a chair with arms (e.g., wheelchair, bedside commode, etc,.)?: None Help needed moving to and from a bed to chair (including a wheelchair)?: None Help needed walking in hospital room?: A Little Help needed climbing 3-5 steps with a railing? : A Little 6 Click Score: 22    End of Session Equipment Utilized During Treatment: Gait belt;Oxygen Activity Tolerance: Patient tolerated treatment well Patient left: in chair;with call bell/phone within reach Nurse Communication: Mobility status PT Visit Diagnosis: Other (comment) (decreased functional capacity)     Time: [id number] PT Time Calculation (min) (ACUTE ONLY): 41 min  Charges:  $Gait Training: 23-37 mins $Self Care/Home Management: 8-22                    G Codes:       Wayne Benson,PT Acute Rehabilitation [phone] (pager)    Wayne Benson 09/02/2016, 1:14 PM

## 2016-09-02 NOTE — Progress Notes (Signed)
Inpatient Diabetes Program Recommendations  AACE/ADA: New Consensus Statement on Inpatient Glycemic Control (2015)  Target Ranges:  Prepandial:   less than 140 mg/dL      Peak postprandial:   less than 180 mg/dL (1-2 hours)      Critically ill patients:  140 - 180 mg/dL   Results for Anselm JunglingELSON, Whit L (MRN [id number]) as of 09/02/2016 11:17  Ref. Range 09/01/2016 07:36 09/01/2016 12:13 09/01/2016 17:34 09/01/2016 19:36 09/01/2016 21:46 09/02/2016 07:39  Glucose-Capillary Latest Ref Range: 65 - 99 mg/dL 308227 (H) 657172 (H) 846203 (H) 269 (H) 243 (H) 270 (H)   Review of Glycemic Control  Agree with Current regimen adjustments. Lantus increased to 45 units BID and Novolog Meal Coverage increased to 15 units tid. Patient also on Novolog Moderate + Novolog HS scale. Will watch trends. Will also touch base with patient today.   Thanks,  Christena DeemShannon Karleen Seebeck RN, MSN, [facility] Inpatient Diabetes Coordinator Team Pager [phone] (8a-5p)

## 2016-09-02 NOTE — NC FL2 (Signed)
[place] MEDICAID FL2 LEVEL OF CARE SCREENING TOOL     IDENTIFICATION  Patient Name: Wayne JunglingDuke L Mohler Birthdate: Oct 13, 1955 Sex: male Admission Date (Current Location): 08/23/2016  [facility] [place]   Facility and Address:  [facility], [address], [place], [place] [address]      Provider Number: [id number]  Attending Physician Name and Address:  Tyson AliasVincent, Duncan Thomas, *  Relative Name and Phone Number:  Evalyn Cascoelson,Kathy - Spouse; [phone], [phone]     Current Level of Care: Hospital Recommended Level of Care: Skilled Nursing Facility Prior Approval Number:    Date Approved/Denied:   PASRR Number: [id number] E Discharge Plan: SNF    Current Diagnoses: Patient Active Problem List   Diagnosis Date Noted  . Acute respiratory failure with hypoxia (HCC) 08/24/2016  . Hypoglycemia 08/23/2016  . Acute hypoxemic respiratory failure (HCC)   . ILD (interstitial lung disease) (HCC)   . ARDS (adult respiratory distress syndrome) (HCC)   . Hypoxemia   . Respiratory failure (HCC)   . Influenza A 08/06/2016  . Thrombocytopenia (HCC) 08/06/2016  . Diabetes (HCC) 01/20/2016  . Esophageal varices in cirrhosis (HCC) 01/20/2016  . Tobacco abuse 01/20/2016  . GERD (gastroesophageal reflux disease) 01/20/2016  . Liver cirrhosis secondary to NASH (HCC) 01/20/2016    Orientation RESPIRATION BLADDER Height & Weight     Self, Time, Situation, Place  Normal Continent Weight: 190 lb 1.6 oz (86.2 kg) Height:  5\' 10"  (177.8 cm)  BEHAVIORAL SYMPTOMS/MOOD NEUROLOGICAL BOWEL NUTRITION STATUS      Continent Diet (Carb modified/thin fluid consistency)  AMBULATORY STATUS COMMUNICATION OF NEEDS Skin   Supervision Verbally Other (Comment) (Excoriated right/left buttocks, Stage 1 pressure injury to right/left buttocks)                       Personal Care Assistance Level of Assistance  Bathing, Feeding, Dressing Bathing Assistance: Limited  assistance (Assistance with set-up) Feeding assistance: Limited assistance (Assistance with set-up) Dressing Assistance: Limited assistance (Assistance with set-up)     Functional Limitations Info  Sight, Hearing, Speech Sight Info: Impaired (Wears glasses for reading) Hearing Info: Adequate Speech Info: Adequate    SPECIAL CARE FACTORS FREQUENCY  PT (By licensed PT), OT (By licensed OT)     PT Frequency: Evaluated 5/7 and a minimum of 3X per week therapy recommended OT Frequency: Evaluated 5/7 and a minimum of 2X per week therapy recommended             Contractures Contractures Info: Not present    Additional Factors Info  Code Status, Allergies, Insulin Sliding Scale Code Status Info: Full Allergies Info: Duloxetine hcl   Insulin Sliding Scale Info: 0-9 Units insulin 3X per day with meals       Current Medications (09/02/2016):  This is the current hospital active medication list Current Facility-Administered Medications  Medication Dose Route Frequency Provider Last Rate Last Dose  . acetaminophen (TYLENOL) tablet 650 mg  650 mg Oral Q6H PRN Eulah PontBlum, Nina, MD   650 mg at 09/02/16 0455   Or  . acetaminophen (TYLENOL) suppository 650 mg  650 mg Rectal Q6H PRN Eulah PontBlum, Nina, MD      . chlorpheniramine-HYDROcodone (TUSSIONEX) 10-8 MG/5ML suspension 5 mL  5 mL Oral Q12H PRN Beather ArbourPatel, Rushil V, MD   5 mL at 09/01/16 1233  . feeding supplement (GLUCERNA SHAKE) (GLUCERNA SHAKE) liquid 237 mL  237 mL Oral TID BM Rice, Jamesetta Orleanshristopher W, MD  237 mL at 09/02/16 0953  . gabapentin (NEURONTIN) capsule 900 mg  900 mg Oral QHS Heywood Iles V, MD   900 mg at 09/01/16 2210  . guaiFENesin (MUCINEX) 12 hr tablet 600 mg  600 mg Oral BID Molt, Bethany, DO   600 mg at 09/02/16 0951  . insulin aspart (novoLOG) injection 0-15 Units  0-15 Units Subcutaneous TID WC Molt, Bethany, DO   8 Units at 09/02/16 0847  . insulin aspart (novoLOG) injection 0-5 Units  0-5 Units Subcutaneous QHS Molt, Bethany, DO   2  Units at 09/01/16 2211  . insulin aspart (novoLOG) injection 10 Units  10 Units Subcutaneous TID WC Molt, Bethany, DO   10 Units at 09/02/16 0848  . insulin glargine (LANTUS) injection 40 Units  40 Units Subcutaneous BID Molt, Bethany, DO   40 Units at 09/02/16 0950  . ipratropium-albuterol (DUONEB) 0.5-2.5 (3) MG/3ML nebulizer solution 3 mL  3 mL Nebulization BID Fuller Plan, MD   3 mL at 09/02/16 0935  . nadolol (CORGARD) tablet 20 mg  20 mg Oral Daily Beather Arbour, MD   20 mg at 09/02/16 0950  . phenol (CHLORASEPTIC) mouth spray 1 spray  1 spray Mouth/Throat PRN Molt, Bethany, DO      . senna-docusate (Senokot-S) tablet 1 tablet  1 tablet Oral QHS PRN Eulah Pont, MD      . simvastatin (ZOCOR) tablet 20 mg  20 mg Oral QHS Beather Arbour, MD   20 mg at 09/01/16 2211     Discharge Medications: Please see discharge summary for a list of discharge medications.  Relevant Imaging Results:  Relevant Lab Results:   Additional Information ss#[id number].    Alric Geise A Zong Mcquarrie, LCSW

## 2016-09-02 NOTE — Progress Notes (Signed)
Pt is on O2 at 4L per nasal cannula. Mild SOB with activity/ exertion. Tolerated a full shower today. Transfer[place] [facility] [place] [facility], report was given to Pacific Mutualichole Black.

## 2016-09-03 ENCOUNTER — Inpatient Hospital Stay: Payer: No Typology Code available for payment source | Admitting: Adult Health

## 2016-09-17 ENCOUNTER — Telehealth: Payer: Self-pay | Admitting: Pulmonary Disease

## 2016-09-17 NOTE — Telephone Encounter (Signed)
Spoke with patient Sister-Tonya, requesting sooner appt than 10/10/2016 (with JN)  Pt [facility] 08/23/16 and d/c'd home 09/02/16.  Pt was discharged to [facility] in [place], [place].  Requesting sooner appt with JN to discuss biopsy. appt rescheduled to 09/27/16 with JN.  Nothing further needed.

## 2016-09-23 ENCOUNTER — Telehealth: Payer: Self-pay | Admitting: Pulmonary Disease

## 2016-09-23 NOTE — Telephone Encounter (Signed)
Spoke about appt. He was not aware of appt with Dr. Jamison NeighborNestor scheduled for Friday June 8th at 3pm [facility]. Advised pt of this appt. He verbalized understanding. Nothing further needed at time of call.

## 2016-09-27 ENCOUNTER — Ambulatory Visit: Payer: No Typology Code available for payment source | Admitting: Pulmonary Disease

## 2016-10-10 ENCOUNTER — Ambulatory Visit: Payer: No Typology Code available for payment source | Admitting: Pulmonary Disease

## 2016-10-16 ENCOUNTER — Ambulatory Visit (INDEPENDENT_AMBULATORY_CARE_PROVIDER_SITE_OTHER): Payer: Non-veteran care | Admitting: Pulmonary Disease

## 2016-10-16 ENCOUNTER — Other Ambulatory Visit (INDEPENDENT_AMBULATORY_CARE_PROVIDER_SITE_OTHER): Payer: Non-veteran care

## 2016-10-16 ENCOUNTER — Encounter: Payer: Self-pay | Admitting: Pulmonary Disease

## 2016-10-16 VITALS — BP 108/60 | HR 73 | Ht 70.0 in | Wt 193.2 lb

## 2016-10-16 DIAGNOSIS — J9601 Acute respiratory failure with hypoxia: Secondary | ICD-10-CM

## 2016-10-16 DIAGNOSIS — F17200 Nicotine dependence, unspecified, uncomplicated: Secondary | ICD-10-CM

## 2016-10-16 DIAGNOSIS — J849 Interstitial pulmonary disease, unspecified: Secondary | ICD-10-CM

## 2016-10-16 LAB — C-REACTIVE PROTEIN: CRP: 1.2 mg/dL (ref 0.5–20.0)

## 2016-10-16 LAB — SEDIMENTATION RATE: SED RATE: 21 mm/h — AB (ref 0–20)

## 2016-10-16 NOTE — Progress Notes (Signed)
Subjective:    Patient ID: Wayne Benson, male    DOB: 1956/01/13, 61 y.o.   MRN: [id number]  C.C.:  Follow-up for ILD, Acute Hypoxic Respiratory Failure & Tobacco Use Disorder.   HPI ILD: New diagnosis and seen on CT imaging. Patient's limited autoimmune workup was negative. 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 He reports his dyspnea is improving. He reports mild coughing producing a yellow-green mucus.   Acute Hypoxic Respiratory Failure: Patient hospitalized in May with influenza A pneumonia. Did require noninvasive positive pressure ventilation briefly. As per my review of his electronic medical record, the patient required 4 L/m at rest and 5 L/m with exertion as documented by physical therapist on 5/14. Since his last discharge from hospital patient was prescribed oxygen at 2-3 L/m with exertion. He was not using oxygen upon arrival today. Patient had no oxygen requirement during his brief walk test today.   Tobacco Use Disorder: Patient was smoking cigarettes prior to hospitalization in May. He continues to abstain from tobacco.   Review of Systems He reports no chest pain or pressure. No fever or chills. No abdominal pain but does have occasional nausea.   Allergies  Allergen Reactions  . Duloxetine Hcl Other (See Comments)    Current Outpatient Prescriptions on File Prior to Visit  Medication Sig Dispense Refill  . acetaminophen (TYLENOL) 500 MG tablet Take 1,500 mg by mouth every 6 (six) hours as needed for mild pain.    . chlorpheniramine-HYDROcodone (TUSSIONEX) 10-8 MG/5ML SUER Take 5 mLs by mouth every 12 (twelve) hours as needed for cough. 140 mL 0  . feeding supplement, GLUCERNA SHAKE, (GLUCERNA SHAKE) LIQD Take 237 mLs by mouth 3 (three) times daily between meals. 16 Can 2  . ferrous sulfate 325 (65 FE) MG tablet Take 325 mg by mouth daily with breakfast.    . folic acid (FOLVITE) 1 MG tablet Take 1 mg by mouth daily.    Marland Kitchen gabapentin (NEURONTIN) 300 MG capsule Take 900 mg by  mouth at bedtime.    . insulin aspart (NOVOLOG) 100 UNIT/ML injection Inject 0-15 Units into the skin 3 (three) times daily with meals. 10 mL 11  . insulin aspart (NOVOLOG) 100 UNIT/ML injection Inject 0-5 Units into the skin at bedtime. 10 mL 11  . insulin aspart (NOVOLOG) 100 UNIT/ML injection Inject 15 Units into the skin 3 (three) times daily with meals. 10 mL 11  . insulin glargine (LANTUS) 100 UNIT/ML injection Inject 0.45 mLs (45 Units total) into the skin 2 (two) times daily. 10 mL 11  . nadolol (CORGARD) 20 MG tablet Take 20 mg by mouth daily.    . ondansetron (ZOFRAN) 8 MG tablet Take 8 mg by mouth every 8 (eight) hours as needed for nausea or vomiting.    . pantoprazole (PROTONIX) 40 MG tablet Take 40 mg by mouth 2 (two) times daily.    Marland Kitchen zolpidem (AMBIEN) 5 MG tablet Take 5 mg by mouth at bedtime as needed for sleep.    Marland Kitchen albuterol (PROVENTIL HFA;VENTOLIN HFA) 108 (90 Base) MCG/ACT inhaler Inhale 2 puffs into the lungs every 6 (six) hours as needed for wheezing or shortness of breath. (Patient not taking: Reported on 10/16/2016) 1 Inhaler 2  . cetirizine (ZYRTEC) 10 MG tablet Take 10 mg by mouth daily.    . cyanocobalamin 500 MCG tablet Take 500 mcg by mouth daily.    Marland Kitchen guaiFENesin (MUCINEX) 600 MG 12 hr tablet Take 1 tablet (600 mg total) by mouth  2 (two) times daily. (Patient not taking: Reported on 10/16/2016) 14 tablet 0  . ipratropium-albuterol (DUONEB) 0.5-2.5 (3) MG/3ML SOLN Take 3 mLs by nebulization every 6 (six) hours as needed. (Patient not taking: Reported on 10/16/2016) 360 mL 0  . ipratropium-albuterol (DUONEB) 0.5-2.5 (3) MG/3ML SOLN Take 3 mLs by nebulization every 6 (six) hours as needed. (Patient not taking: Reported on 10/16/2016) 360 mL 0  . simvastatin (ZOCOR) 20 MG tablet Take 20 mg by mouth at bedtime.     No current facility-administered medications on file prior to visit.     Past Medical History:  Diagnosis Date  . Anemia   . Anxiety   . Bipolar disorder  (HCC)   . Bronchial asthma    "get it just about q yr" (08/06/2016)  . Depression   . Diabetes mellitus without complication (HCC)   . DVT (deep venous thrombosis) (HCC)   . Esophageal varices (HCC)   . GERD (gastroesophageal reflux disease)   . History of blood transfusion    "related to cirrhosis"  . Hypercholesteremia   . Liver cirrhosis secondary to NASH (HCC)    hx/notes 08/06/2016  . Non-alcoholic cirrhosis (HCC)   . Peripheral neuropathy   . Schizophrenia (HCC)   . Thrombocytopenia (HCC)     Past Surgical History:  Procedure Laterality Date  . CARDIAC CATHETERIZATION     "@ the [place]":  Marland Kitchen CATARACT EXTRACTION W/ INTRAOCULAR LENS IMPLANT Right   . ESOPHAGOGASTRODUODENOSCOPY (EGD) WITH PROPOFOL Left 01/21/2016   Procedure: ESOPHAGOGASTRODUODENOSCOPY (EGD) WITH PROPOFOL;  Surgeon: Willis Modena, MD;  Location: [facility] ENDOSCOPY;  Service: Endoscopy;  Laterality: Left;  . FINGER SURGERY Right 1990s?   "thumb; sliced it w/a blade"  . IVC FILTER PLACEMENT ([facility] HX)    . US GUIDED PARACENTESIS ([facility] HX)      Family History  Problem Relation Age of Onset  . Congestive Heart Failure Mother   . Neuropathy Mother   . Heart disease Sister   . Diabetes Brother   . Diabetes Brother   . Heart disease Brother   . Neuropathy Brother   . Lung disease Neg Hx   . Rheumatologic disease Neg Hx     Social History   Social History  . Marital status: Married    Spouse name: N/A  . Number of children: N/A  . Years of education: N/A   Social History Main Topics  . Smoking status: Former Smoker    Packs/day: 0.25    Years: 5.00    Types: Cigarettes    Quit date: 09/04/2016  . Smokeless tobacco: Never Used  . Alcohol use No  . Drug use: No  . Sexual activity: No   Other Topics Concern  . None   Social History Narrative   [facility] (10/16/16):   Originally from [facility] and lived in all his life. Previously worked with Vertell Limber and other Art therapist of pain with  exposure to chemicals. 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 He was in [place] as well. Has a dog at home. No bird exposure. Does have mold in his home currently but reports it is a minimal amount in one of his bathrooms around the floor and bedroom behind the TV. No hot tub exposure. He has exposure to silica powder.       Objective:   Physical Exam BP 108/60 (BP Location: Left Arm,  Cuff Size: Normal)   Pulse 73   Ht 5\' 10"  (1.778 m)   Wt 193 lb 3.2 oz (87.6 kg)   SpO2 97%   BMI 27.72 kg/m  General:  Awake. Alert. No acute distress. Caucasian male. Integument:  Warm & dry. No rash on exposed skin. No bruising on exposed skin. Extremities:  No cyanosis or clubbing.  HEENT:  Moist mucus membranes. No oral ulcers. No scleral injection or icterus. Minimal nasal turbinate swelling Cardiovascular:  Regular rate. No edema. No appreciable JVD.  Pulmonary:  Good aeration & clear to auscultation bilaterally. Symmetric chest wall expansion. No accessory muscle use on room air. Abdomen: Soft. Normal bowel sounds. Nondistended. Grossly nontender. Musculoskeletal:  Normal bulk and tone. No joint deformity or effusion appreciated.  6MWT 10/16/16:  Walked 3 laps / Baseline Sat 100% on RA / Nadir Sat 97% on RA (walked at a brisk pace)  IMAGING CTA CHEST 08/17/16 (personally reviewed by me):  Cranial caudal progression of subpleural opacities and reticulation highly suggestive of interstitial lung disease. In the upper lung zones there does appear to be some peripheral sparing. Questionable honeycombing changes. No obvious traction bronchiectasis. No pathologic mediastinal adenopathy. No pulmonary embolism. Questionable small left pleural effusion without obvious pleural thickening. No pericardial effusion.  CARDIAC TTE (08/17/16):  LV mildly dilated with normal wall thickness & EF 50-55%. No  regional wall motion abnormalities. Normal diastolic function. LA moderately dilated & RA normal in size. RV normal in size and function. No aortic stenosis or regurgitation. Aortic root normal in size. Trivial mitral regurgitation without stenosis. Trivial pulmonic regurgitation without stenosis. Mild tricuspid regurgitation. No pericardial effusion.  LABS 08/17/16 ANA:  Negative Anti-Jo1:  <0.2 Anti-CCP:  3 RF:  <10 SSA:  <0.2 SSB:  <0.2 SCL-70: 0.2    Assessment & Plan:  61 y.o. male previously hospitalized with acute hypoxic respiratory failure secondary to influenza A pneumonia. Chest imaging consistent with interstitial lung disease on CT scan by my review. Limited autoimmune workup was negative previously.Patient has no oxygen requirement with ambulation today which is reassuring for continued recovery. We will need to perform further chest imaging and workup to ensure that he does not have underlying COPD or residual fibrotic changes. I instructed the patient contact my office if he had any new breathing problems or questions before next appointment.  1. ILD:  Checking high-resolution CT chest without contrast with both prone and supine imaging. Also checking serumESR, CRP, myositis panel & hypersensitivity pneumonitis panel. 2. Acute hypoxic respiratory failure: Checking 6 minute walk test with oxygen titration on her before next appointment. 3. Tobacco use disorder:Encouraged patient to continue to abstain. 4. Follow-up: Return to clinic in 2 months or sooner if needed.  Donna ChristenJennings E. Jamison NeighborNestor, M.D. [facility] Pager:  [phone] After 3pm or if no response, call [phone] 3:04 PM 10/16/16

## 2016-10-16 NOTE — Patient Instructions (Addendum)
   We will check a longer walk test before we discontinue your home oxygen.  Call me if you have any new breathing problems or questions before your next appointment.   TESTS ORDERED: 1. Full PFT at next appointment 2. 6MWT with oxygen titration on or before next appointment  3. HRCT Chest w/o with prone & supine imaging 4. Serum ESR, CRP, Myositis Panel & Hypersensitivity Pneumonitis Panel.

## 2016-10-22 ENCOUNTER — Ambulatory Visit (INDEPENDENT_AMBULATORY_CARE_PROVIDER_SITE_OTHER)
Admission: RE | Admit: 2016-10-22 | Discharge: 2016-10-22 | Disposition: A | Discharge status: Home / Self Care | Payer: No Typology Code available for payment source | Source: Ambulatory Visit | Attending: Pulmonary Disease | Admitting: Pulmonary Disease

## 2016-10-22 DIAGNOSIS — J849 Interstitial pulmonary disease, unspecified: Secondary | ICD-10-CM

## 2016-10-22 LAB — HYPERSENSITIVITY PNEUMONITIS
A. FUMIGATUS #1 ABS: NEGATIVE
A. PULLULANS ABS: NEGATIVE
Micropolyspora faeni, IgG: NEGATIVE
Pigeon Serum Abs: NEGATIVE
THERMOACT. SACCHARII: NEGATIVE
THERMOACTINOMYCES VULGARIS IGG: NEGATIVE

## 2016-10-22 LAB — MYOSITIS PANEL III
EJ*: NEGATIVE
JO-1 (WB): NEGATIVE
KU: NEGATIVE
MI-2 ANTIBODIES: NEGATIVE
OJ*: NEGATIVE
PL-12: NEGATIVE
PL-7: NEGATIVE
PM-Scl 100*: NEGATIVE
PM-Scl 75*: NEGATIVE
RNP: 5.8 EU/ml
RO-52*: NEGATIVE
Signal Recognition Particle*: NEGATIVE

## 2016-10-31 ENCOUNTER — Telehealth: Payer: Self-pay | Admitting: Pulmonary Disease

## 2016-10-31 NOTE — Telephone Encounter (Signed)
Patient returning phone call. [phone].

## 2016-10-31 NOTE — Telephone Encounter (Signed)
Attempted to contact patient today on his home phone, his cell phone, and his wife's cell phone. I was unsuccessful. No surgical biopsy is necessary at this time. As long as the patient feels his symptoms are stable and his breathing is improving we will review his CT scan at his follow-up appointment. Please communicate this to the patient. Please have him contact our office if he experiences any new breathing problems and/or let me know if he has any further questions. Thank you.

## 2016-10-31 NOTE — Telephone Encounter (Signed)
Patient had no oxygen requirement during his in-office walk test at last appointment. See my note. I would try to get his done ASAP to see if he truly has any need. I do not see where he has had any biopsy done nor do I have a result yet.  J.

## 2016-10-31 NOTE — Telephone Encounter (Signed)
lmomtcb x1 

## 2016-10-31 NOTE — Telephone Encounter (Signed)
JN  Please Advise-  Spoke with pt and informed him of your message. He stated he actually was looking for the results of his CT scan to determine if he needs a biopsy.   FYI to nurse: Pt agreed to move up his walk and this has been changed.

## 2016-10-31 NOTE — Telephone Encounter (Signed)
JN  Please Advise-  Pt called in wanting results of his biopsy. [facility] also stated [facility]. I did not see where we were the ones who qualified patient, he believes it was the hospital. He states he needs his oxygen and does not know what to do. He states he does not know where they are getting there information from as to where he would not need it. He states he is on 3L with exertion.

## 2016-11-01 ENCOUNTER — Ambulatory Visit: Payer: No Typology Code available for payment source

## 2016-11-01 NOTE — Telephone Encounter (Signed)
Called and spoke with pts wife and she stated that the pt is outside somewhere.  She will have him call our office back.

## 2016-11-01 NOTE — Telephone Encounter (Signed)
Pt has a 6MW test scheduled for 11/05/16 for O2 qualification.  Pt is requesting CT results.  Please advise Dr Jamison NeighborNestor. Thanks.

## 2016-11-01 NOTE — Telephone Encounter (Signed)
Patient is returning phone call. [phone]

## 2016-11-01 NOTE — Telephone Encounter (Signed)
[phone] Patient is returning phone call.

## 2016-11-01 NOTE — Telephone Encounter (Signed)
Per Dr Jamison NeighborNestor:  Paula LibraJN states that he attempted to contact the patient 10/31/16. CT results did not show anything abnormal.  No biopsy is needed at this time.  Will wait on PFT and OV in August to further diagnose.  Keep appt for upcoming 6MW to qualify O2  Pt aware or results and rec's per JN.   Will send back to Dr Jamison NeighborNestor to ensure this is documented in CT results notes.

## 2016-11-01 NOTE — Telephone Encounter (Signed)
lmtcb x2 for pt. 

## 2016-11-05 ENCOUNTER — Ambulatory Visit: Payer: No Typology Code available for payment source

## 2016-11-14 ENCOUNTER — Ambulatory Visit (INDEPENDENT_AMBULATORY_CARE_PROVIDER_SITE_OTHER): Payer: No Typology Code available for payment source | Admitting: *Deleted

## 2016-11-14 DIAGNOSIS — J849 Interstitial pulmonary disease, unspecified: Secondary | ICD-10-CM

## 2016-11-14 NOTE — Progress Notes (Signed)
SIX MIN WALK 11/14/2016 10/16/2016  Medications no medications taken today.  -  Supplimental Oxygen during Test? (L/min) No -  Laps 7 -  Partial Lap (in Meters) 20 -  Baseline BP (sitting) 122/68 -  Baseline Heartrate 74 -  Baseline Dyspnea (Borg Scale) 3 -  Baseline Fatigue (Borg Scale) 6 -  Baseline SPO2 100 -  BP (sitting) 100/58 -  Heartrate 84 -  Dyspnea (Borg Scale) 3 -  Fatigue (Borg Scale) 3 -  SPO2 96 -  BP (sitting) 102/62 -  Heartrate 79 -  SPO2 97 -  Stopped or Paused before Six Minutes No -  Interpretation Angina;Leg pain -  Distance Completed 356 -  Tech Comments: test prefromed with forehead head. pt walked at moderate pace with no desats. pt had complaints of chest discomfort due to Esophageal Varices and Leg pain. pt states leg pain is normal with this amount of exertion.  pt's lowest spo2 during walk was 94 % HR 86. Pt completed all three laps successfully. Walked at a fast pace with steady gait. Pt stated, "I feel great, the best I have felt in a long time."

## 2016-12-18 ENCOUNTER — Ambulatory Visit: Payer: No Typology Code available for payment source

## 2016-12-18 ENCOUNTER — Ambulatory Visit: Payer: No Typology Code available for payment source | Admitting: Pulmonary Disease

## 2017-01-29 ENCOUNTER — Ambulatory Visit (INDEPENDENT_AMBULATORY_CARE_PROVIDER_SITE_OTHER): Payer: No Typology Code available for payment source | Admitting: Pulmonary Disease

## 2017-01-29 ENCOUNTER — Encounter: Payer: Self-pay | Admitting: Pulmonary Disease

## 2017-01-29 VITALS — BP 96/72 | HR 77 | Ht 70.0 in | Wt 174.2 lb

## 2017-01-29 DIAGNOSIS — Z23 Encounter for immunization: Secondary | ICD-10-CM

## 2017-01-29 DIAGNOSIS — J849 Interstitial pulmonary disease, unspecified: Secondary | ICD-10-CM

## 2017-01-29 DIAGNOSIS — F17200 Nicotine dependence, unspecified, uncomplicated: Secondary | ICD-10-CM | POA: Diagnosis not present

## 2017-01-29 NOTE — Progress Notes (Signed)
Subjective:    Patient ID: Wayne Benson, male    DOB: [birth year], 61 y.o.   MRN: [id number]  C.C.:  Follow-up for ILD & Tobacco Use Disorder.   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 Serum workup negative. He reports no new breathing problems. He reports continued intermittent coughing & wheezing.   Tobacco use disorder: Patient was smoking cigarettes prior to hospitalization in May. At last appointment he was continuing to abstain. He does smoke a small pipe tobacco cigar occasionally.   Review of Systems Denies any sinus congestion or drainage. No frank chest pain. He does have some chest discomfort. No abdominal pain or nausea recently. He does have early morning nausea and emesis at times.   Allergies  Allergen Reactions  . Duloxetine Hcl Other (See Comments)    Current Outpatient Prescriptions on File Prior to Visit  Medication Sig Dispense Refill  . acetaminophen (TYLENOL) 500 MG tablet Take 1,500 mg by mouth every 6 (six) hours as needed for mild pain.    Marland Kitchen albuterol (PROVENTIL HFA;VENTOLIN HFA) 108 (90 Base) MCG/ACT inhaler Inhale 2 puffs into the lungs every 6 (six) hours as needed for wheezing or shortness of breath. 1 Inhaler 2  . cetirizine (ZYRTEC) 10 MG tablet Take 10 mg by mouth daily.    . cyanocobalamin 500 MCG tablet Take 500 mcg by mouth daily.    . feeding supplement, GLUCERNA SHAKE, (GLUCERNA SHAKE) LIQD Take 237 mLs by mouth 3 (three) times daily between meals. 16 Can 2  . ferrous sulfate 325 (65 FE) MG tablet Take 325 mg by mouth daily with breakfast.    . folic acid (FOLVITE) 1 MG tablet Take 1 mg by mouth daily.    Marland Kitchen gabapentin (NEURONTIN) 300 MG capsule Take 900 mg by mouth at bedtime.    Marland Kitchen guaiFENesin (MUCINEX) 600 MG 12 hr tablet Take 1 tablet (600 mg total) by mouth 2 (two) times daily. 14 tablet 0  . insulin aspart (NOVOLOG) 100 UNIT/ML injection Inject 0-15 Units into the skin 3 (three) times  daily with meals. 10 mL 11  . insulin aspart (NOVOLOG) 100 UNIT/ML injection Inject 0-5 Units into the skin at bedtime. 10 mL 11  . insulin aspart (NOVOLOG) 100 UNIT/ML injection Inject 15 Units into the skin 3 (three) times daily with meals. 10 mL 11  . insulin glargine (LANTUS) 100 UNIT/ML injection Inject 0.45 mLs (45 Units total) into the skin 2 (two) times daily. 10 mL 11  . ipratropium-albuterol (DUONEB) 0.5-2.5 (3) MG/3ML SOLN Take 3 mLs by nebulization every 6 (six) hours as needed. 360 mL 0  . ipratropium-albuterol (DUONEB) 0.5-2.5 (3) MG/3ML SOLN Take 3 mLs by nebulization every 6 (six) hours as needed. 360 mL 0  . nadolol (CORGARD) 20 MG tablet Take 20 mg by mouth daily.    . ondansetron (ZOFRAN) 8 MG tablet Take 8 mg by mouth every 8 (eight) hours as needed for nausea or vomiting.    . pantoprazole (PROTONIX) 40 MG tablet Take 40 mg by mouth 2 (two) times daily.    Marland Kitchen zolpidem (AMBIEN) 5 MG tablet Take 5 mg by mouth at bedtime as needed for sleep.    . chlorpheniramine-HYDROcodone (TUSSIONEX) 10-8 MG/5ML SUER Take 5 mLs by mouth every 12 (twelve) hours as needed for cough. (Patient not taking: Reported on 01/29/2017) 140 mL 0  . simvastatin (ZOCOR) 20 MG tablet Take 20 mg by mouth at bedtime.    . TRAZODONE HCL  PO Take 25 mg by mouth at bedtime.     No current facility-administered medications on file prior to visit.     Past Medical History:  Diagnosis Date  . Anemia   . Anxiety   . Bipolar disorder ([place])   . Bronchial asthma    "get it just about q yr" (08/06/2016)  . Depression   . Diabetes mellitus without complication ([place])   . DVT (deep venous thrombosis) ([place])   . Esophageal varices ([place])   . GERD (gastroesophageal reflux disease)   . History of blood transfusion    "related to cirrhosis"  . Hypercholesteremia   . Liver cirrhosis secondary to NASH ([place])    hx/notes 08/06/2016  . 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 Peripheral neuropathy   . Schizophrenia ([place])   .  Thrombocytopenia ([place])     Past Surgical History:  Procedure Laterality Date  . CARDIAC CATHETERIZATION     "@ the [place]":  Marland Kitchen CATARACT EXTRACTION W/ INTRAOCULAR LENS IMPLANT Right   . ESOPHAGOGASTRODUODENOSCOPY (EGD) WITH PROPOFOL Left 01/21/2016   Procedure: ESOPHAGOGASTRODUODENOSCOPY (EGD) WITH PROPOFOL;  Surgeon: Arta Silence, MD;  Location: [facility] ENDOSCOPY;  Service: Endoscopy;  Laterality: Left;  . FINGER SURGERY Right 3329J?   "thumb; sliced it w/a blade"  . IVC FILTER PLACEMENT ([facility] HX)    . US GUIDED PARACENTESIS ([place] HX)      Family History  Problem Relation Age of Onset  . Congestive Heart Failure Mother   . Neuropathy Mother   . Heart disease Sister   . Diabetes Brother   . Diabetes Brother   . Heart disease Brother   . Neuropathy Brother   . Lung disease Neg Hx   . Rheumatologic disease Neg Hx     Social History   Social History  . Marital status: Married    Spouse name: N/A  . Number of children: N/A  . Years of education: N/A   Social History Main Topics  . Smoking status: Current Some Day Smoker    Packs/day: 0.25    Years: 11.00    Types: Cigarettes, Cigars    Start date: 06/11/2005  . Smokeless tobacco: Never Used     Comment: Still smoking pipe tobacco cigars intermittently  . Alcohol use No  . Drug use: No  . Sexual activity: No   Other Topics Concern  . None   Social History Narrative   [name] (10/16/16):   Originally from [facility] and lived in all his life. 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 He was in [place] as well. Has a dog at home. No bird exposure. Does have mold in his home currently but reports it is a minimal amount in one of his bathrooms around the floor and bedroom behind the TV. No hot tub exposure. He has  exposure to silica powder.       Objective:   Physical Exam BP 96/72 (BP Location: Right Arm, Patient Position: Sitting, Cuff Size: Normal)   Pulse 77   Ht '5\' 10"'  (1.778 m)   Wt 174 lb 3.2 oz (79 kg)   SpO2 98%   BMI 25.00 kg/m   General:  Awake. Alert. Caucasian male..  Integument:  Warm. Dry. No rash. Extremities:  No cyanosis or clubbing.  HEENT: No scleral icterus.  No scleral injection. Moist mucous membranes. Poor dentition. Cardiovascular:  Regular rate. No edema.  Regular rhythm.  Pulmonary:  Clear bilaterally with auscultation. Normal work of breathing on room air. Abdomen: Soft. Normal bowel sounds.  Mildly protuberant. Musculoskeletal:  Normal bulk and tone. No joint deformity or effusion appreciated. Neurological:  Cranial nerves 2-12 grossly in tact. No meningismus. Moving all 4 extremities equally.   6MWT 11/14/16:  Walked 356 meters / Baseline Sat 100% on RA / Nadir Sat 96% on RA 10/16/16:  Walked 3 laps / Baseline Sat 100% on RA / Nadir Sat 97% on RA (walked at a brisk pace)  IMAGING HRCT CHEST W/O 10/22/16 (personally reviewed by me):  No parenchymal mass or opacity appreciated. No pleural effusion or thickening. No pericardial effusion. Predominantly subpleural reticulation with some evidence of traction bronchiectasis. No honeycombing changes. No evidence of the cranial caudal gradient. Findings seem to be more consistent with postinflammatory fibrosis. No pathologic mediastinal adenopathy.  CTA CHEST 08/17/16 (personally reviewed by me):  Cranial caudal progression of subpleural opacities and reticulation highly suggestive of interstitial lung disease. In the upper lung zones there does appear to be some peripheral sparing. Questionable honeycombing changes. No obvious traction bronchiectasis. No pathologic mediastinal adenopathy. No pulmonary embolism. Questionable small left pleural effusion without obvious pleural thickening. No pericardial effusion.  CARDIAC TTE  (08/17/16):  LV mildly dilated with normal wall thickness & EF 50-55%. No regional wall motion abnormalities. Normal diastolic function. LA moderately dilated & RA normal in size. RV normal in size and function. No aortic stenosis or regurgitation. Aortic root normal in size. Trivial mitral regurgitation without stenosis. Trivial pulmonic regurgitation without stenosis. Mild tricuspid regurgitation. No pericardial effusion.  LABS 10/16/16 RNP: 5.8 Myositis antibody panel: Negative Hypersensitivity pneumonitis panel: Negative ESR: 21 CRP: 0.2  08/17/16 ANA:  Negative Anti-Jo1:  <0.2 Anti-CCP:  3 RF:  <10 SSA:  <0.2 SSB:  <0.2 SCL-70: 0.2    Assessment & Plan:  61 y.o.  male with previous hospitalization for influenza A pneumonia. Reviewing patient's high-resolution CT scan does show a postinflammatory fibrotic picture. His previous 6 minute walk test showed resolution of his acute hypoxic respiratory failure. We did discuss his ongoing tobacco use at length and I encouraged the patient to abstain from any and all tobacco use. I instructed him to contact my office if he had any new breathing problems or questions before his next appointment.  1. ILD: Likely postinflammatory from influenza A pneumonia. Deferring further workup at this time. Checking full pulmonary function testing. 2. Tobacco use disorder: Patient counseled for over 3 minutes on the need for complete tobacco cessation. 3. Health maintenance:  Status post Pneumonia Vaccine April 2018. Administering Flu Vaccine today.  4. Follow-up: Return to clinic in  3 months or sooner if needed.  Sonia Baller Ashok Cordia, M.D. [facility] Pager:  [phone] After 3pm or if no response, call [phone] 2:53 PM 01/29/17

## 2017-01-29 NOTE — Patient Instructions (Addendum)
   Contact my office if you have any new breathing problems or questions before your next appointment.  We will see you back in 3 months or sooner if needed.  TESTS ORDERED: 1. FULL PFTS on or before next appointment

## 2017-01-29 NOTE — Addendum Note (Signed)
Addended by: Pamalee Leyden on: 01/29/2017 03:08 PM   Modules accepted: Orders

## 2017-05-02 ENCOUNTER — Ambulatory Visit: Payer: No Typology Code available for payment source | Admitting: Adult Health

## 2019-05-13 IMAGING — CT CT CHEST HIGH RESOLUTION W/O CM
2 of 6 series · 14 of 36 positions shown, 17 images · non-contrast
Comparison: 08/17/2016 chest CT angiogram. 08/23/2016 chest
radiograph.

CLINICAL DATA: Chronic dyspnea. Evaluate for interstitial lung
disease.

EXAM:
CT CHEST WITHOUT CONTRAST
TECHNIQUE: Multidetector CT imaging of the chest was performed following the
standard protocol without intravenous contrast. High resolution
imaging of the lungs, as well as inspiratory and expiratory imaging,
was performed.

[Series 6: high resolution · axial · 0.69mm/px · z∈[-378,-134]mm · 11 of 136 slices shown, 14 images]
[im 7/136  mediastinal]
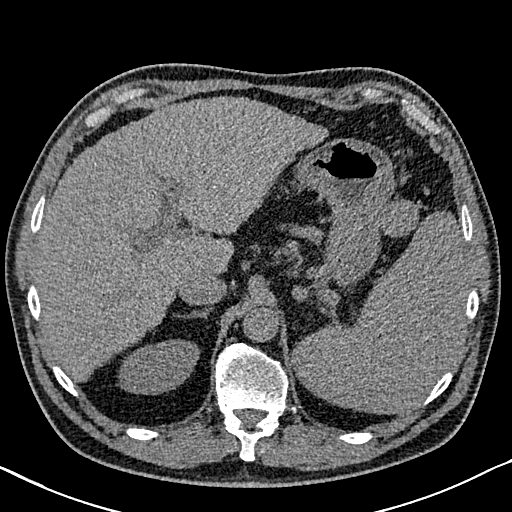
[im 7/136  lung]
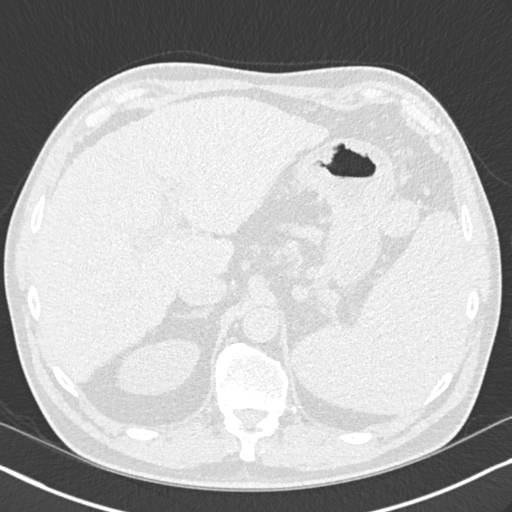
[im 21/136  lung]
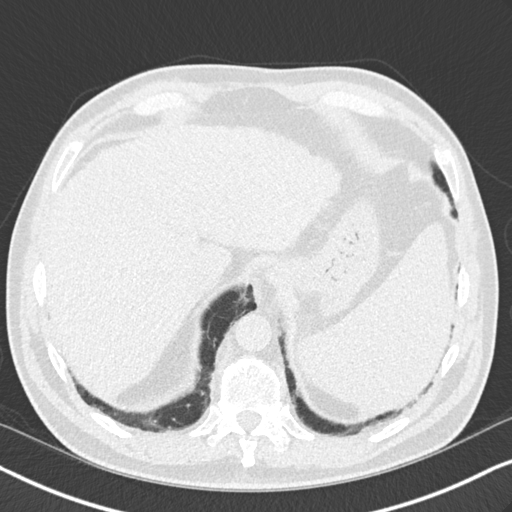
[im 34/136  lung]
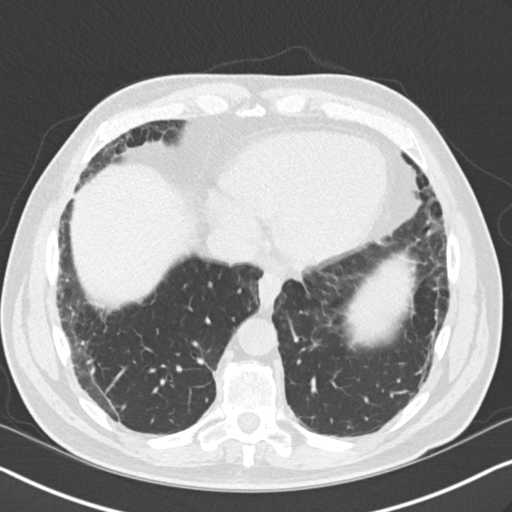
[im 48/136  lung]
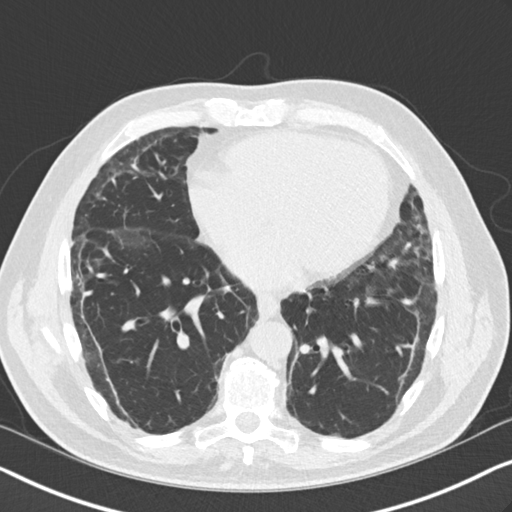
[im 55/136  mediastinal]
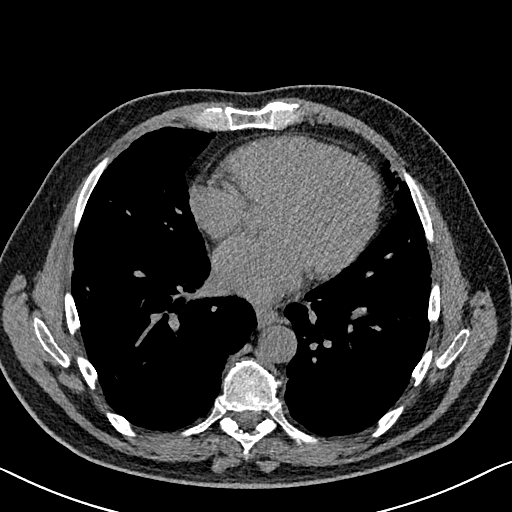
[im 55/136  lung]
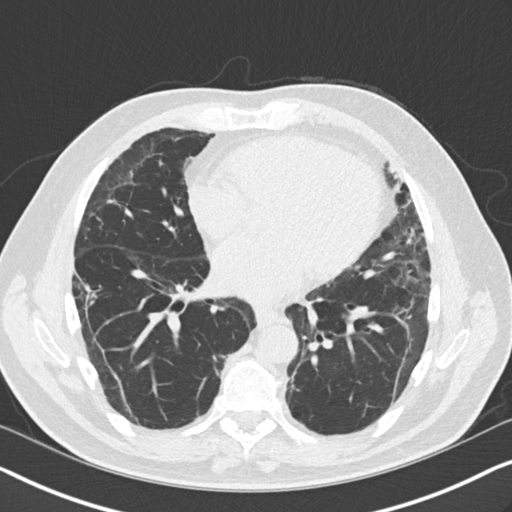
[im 68/136  lung]
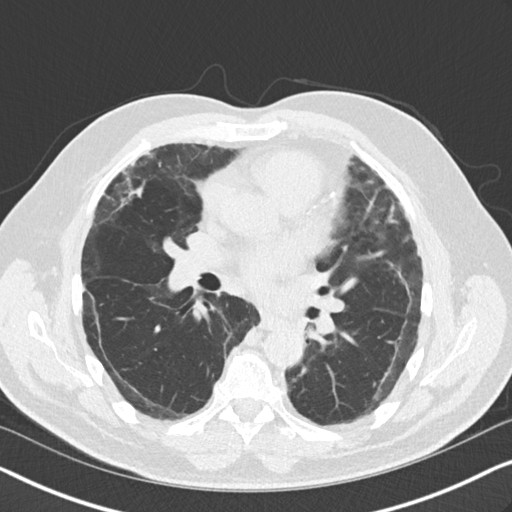
[im 82/136  lung]
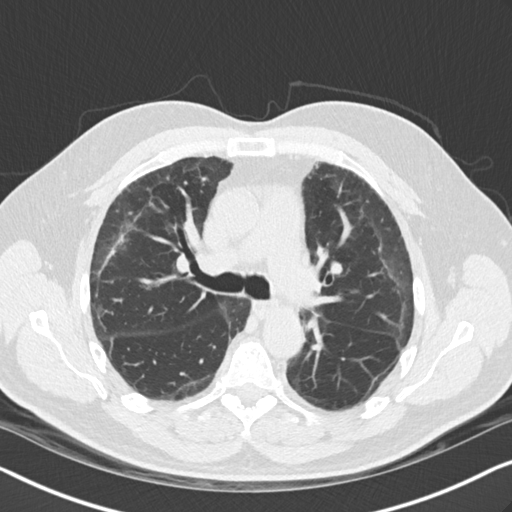
[im 88/136  lung]
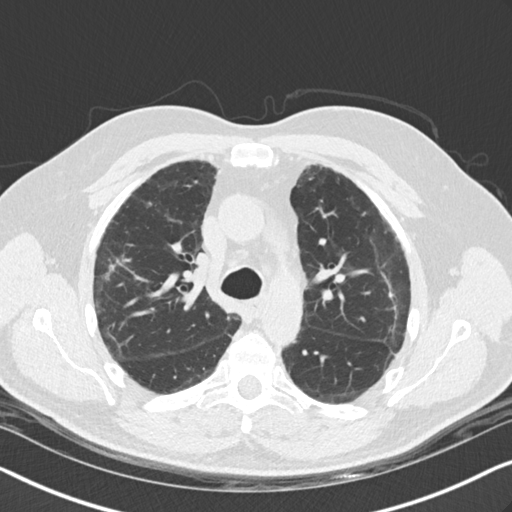
[im 102/136  mediastinal]
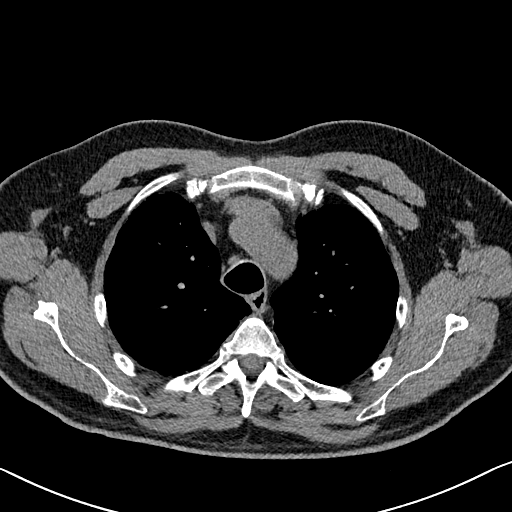
[im 102/136  lung]
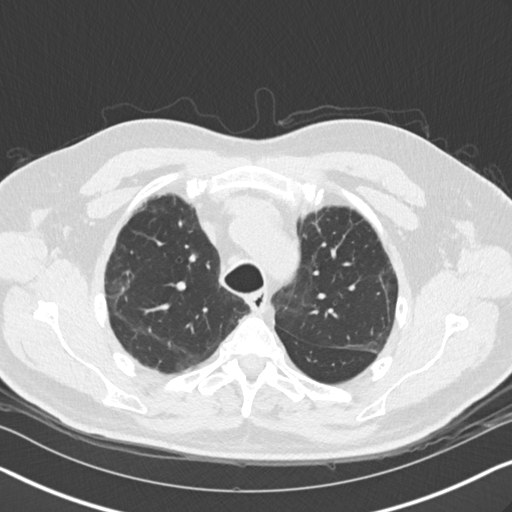
[im 115/136  lung]
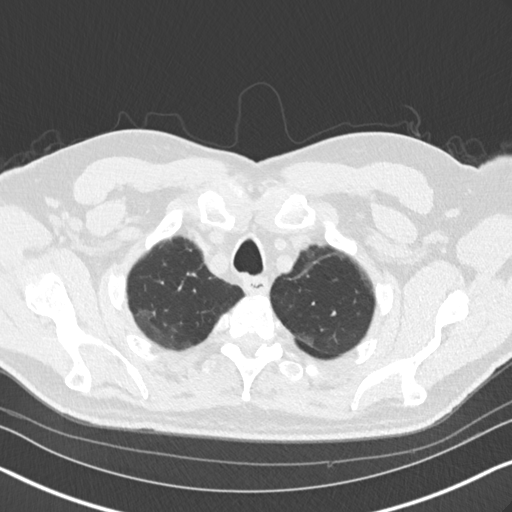
[im 129/136  lung]
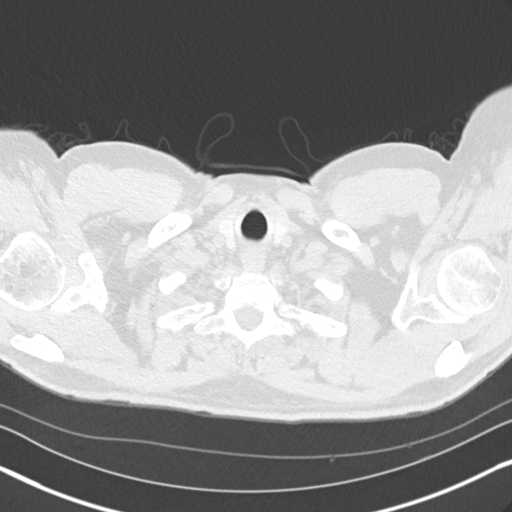

[Series 9: coronal · coronal · 0.59mm/px · 3 of 151 slices shown]
[im 31/151  lung]
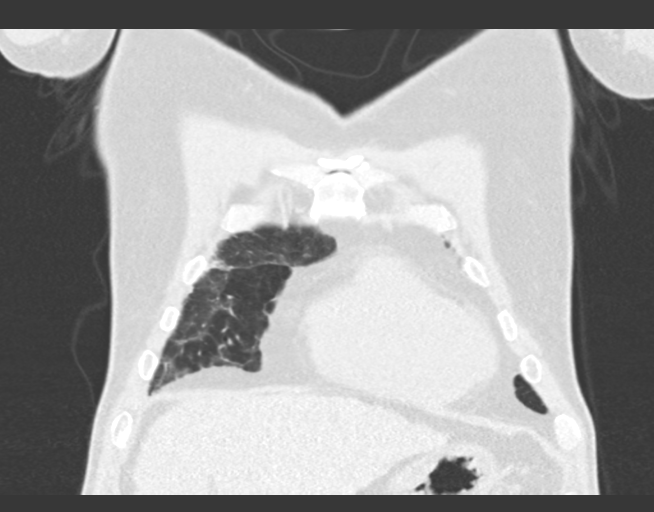
[im 61/151  lung]
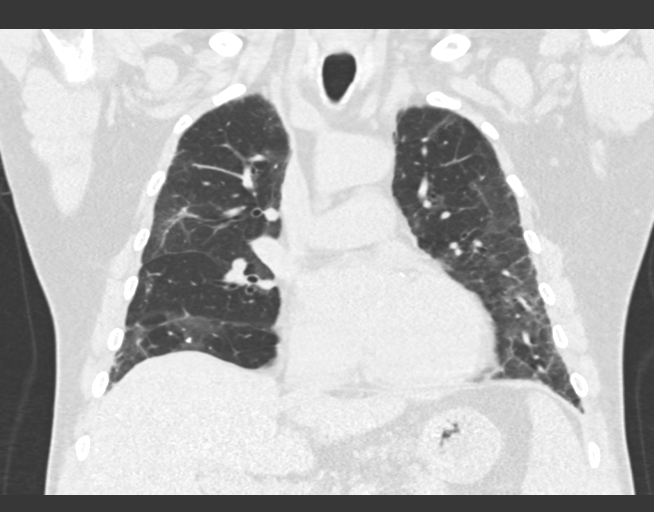
[im 91/151  lung]
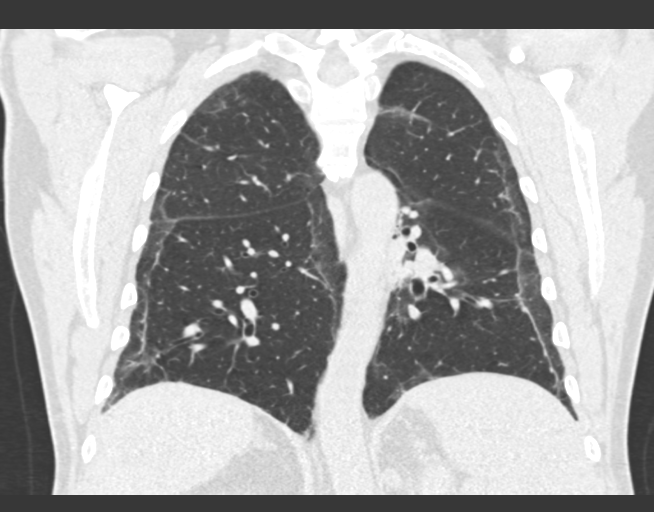

[14 of 36 positions shown; findings below may reference images not displayed]

FINDINGS: Cardiovascular: Normal heart size. No significant pericardial
fluid/thickening. Left anterior descending and left circumflex
coronary atherosclerosis. Great vessels are normal in course and
caliber.

Mediastinum/Nodes: No discrete thyroid nodules. Unremarkable
esophagus. No pathologically enlarged axillary, mediastinal or gross
hilar lymph nodes, noting limited sensitivity for the detection of
hilar adenopathy on this noncontrast study.

Lungs/Pleura: No pneumothorax. No pleural effusion. No acute
consolidative airspace disease, lung masses or significant pulmonary
nodules. The previously visualized extensive perilobular
consolidation throughout both lungs on the 08/17/2016 chest CT study
has resolved, with residual patchy subpleural reticulation,
ground-glass attenuation, perilobular lines, mild traction
bronchiolectasis and mild architectural distortion throughout both
lungs. No frank honeycombing. No significant air trapping on the
expiration sequence.

Upper abdomen: Diffusely irregular liver surface compatible with
cirrhosis, unchanged. No upper abdominal ascites. Stable mild
splenomegaly, partially visualized.

Musculoskeletal: No aggressive appearing focal osseous lesions.
Moderate thoracic spondylosis.
IMPRESSION: 1. Interstitial lung disease characterized by patchy subpleural
reticulation, ground-glass attenuation, perilobular lines, mild
traction bronchiolectasis and mild architectural distortion
throughout both lungs at the site of the now resolved previously
visualized extensive perilobular consolidation on the 08/17/2016
chest CT study. Findings are compatible with postinflammatory
fibrosis. Findings are not compatible with usual interstitial
pneumonia (UIP).
2. Two-vessel coronary atherosclerosis.
3. Cirrhosis.  Splenomegaly.

## 2020-02-15 ENCOUNTER — Encounter (HOSPITAL_COMMUNITY): Payer: Self-pay

## 2020-02-15 ENCOUNTER — Emergency Department (HOSPITAL_COMMUNITY): Payer: No Typology Code available for payment source

## 2020-02-15 ENCOUNTER — Other Ambulatory Visit: Payer: Self-pay

## 2020-02-15 ENCOUNTER — Inpatient Hospital Stay (HOSPITAL_COMMUNITY)
Admission: EM | Admit: 2020-02-15 | Discharge: 2020-02-19 | DRG: 638 | Disposition: A | Discharge status: Other Acute Inpatient Hospital | Payer: No Typology Code available for payment source | Attending: Internal Medicine | Admitting: Internal Medicine

## 2020-02-15 DIAGNOSIS — R7881 Bacteremia: Secondary | ICD-10-CM | POA: Diagnosis present

## 2020-02-15 DIAGNOSIS — K7581 Nonalcoholic steatohepatitis (NASH): Secondary | ICD-10-CM | POA: Diagnosis present

## 2020-02-15 DIAGNOSIS — D649 Anemia, unspecified: Secondary | ICD-10-CM | POA: Diagnosis present

## 2020-02-15 DIAGNOSIS — E1142 Type 2 diabetes mellitus with diabetic polyneuropathy: Secondary | ICD-10-CM | POA: Diagnosis present

## 2020-02-15 DIAGNOSIS — R269 Unspecified abnormalities of gait and mobility: Secondary | ICD-10-CM | POA: Diagnosis present

## 2020-02-15 DIAGNOSIS — Z888 Allergy status to other drugs, medicaments and biological substances status: Secondary | ICD-10-CM

## 2020-02-15 DIAGNOSIS — E7800 Pure hypercholesterolemia, unspecified: Secondary | ICD-10-CM | POA: Diagnosis present

## 2020-02-15 DIAGNOSIS — R45851 Suicidal ideations: Secondary | ICD-10-CM | POA: Diagnosis present

## 2020-02-15 DIAGNOSIS — E1110 Type 2 diabetes mellitus with ketoacidosis without coma: Secondary | ICD-10-CM | POA: Diagnosis present

## 2020-02-15 DIAGNOSIS — K219 Gastro-esophageal reflux disease without esophagitis: Secondary | ICD-10-CM | POA: Diagnosis present

## 2020-02-15 DIAGNOSIS — Z9114 Patient's other noncompliance with medication regimen: Secondary | ICD-10-CM

## 2020-02-15 DIAGNOSIS — F209 Schizophrenia, unspecified: Secondary | ICD-10-CM | POA: Diagnosis present

## 2020-02-15 DIAGNOSIS — Z9181 History of falling: Secondary | ICD-10-CM

## 2020-02-15 DIAGNOSIS — Z681 Body mass index (BMI) 19 or less, adult: Secondary | ICD-10-CM

## 2020-02-15 DIAGNOSIS — E440 Moderate protein-calorie malnutrition: Secondary | ICD-10-CM | POA: Diagnosis present

## 2020-02-15 DIAGNOSIS — N390 Urinary tract infection, site not specified: Secondary | ICD-10-CM | POA: Diagnosis present

## 2020-02-15 DIAGNOSIS — B9562 Methicillin resistant Staphylococcus aureus infection as the cause of diseases classified elsewhere: Secondary | ICD-10-CM | POA: Diagnosis present

## 2020-02-15 DIAGNOSIS — K7460 Unspecified cirrhosis of liver: Secondary | ICD-10-CM | POA: Diagnosis present

## 2020-02-15 DIAGNOSIS — F319 Bipolar disorder, unspecified: Secondary | ICD-10-CM | POA: Diagnosis present

## 2020-02-15 DIAGNOSIS — K529 Noninfective gastroenteritis and colitis, unspecified: Secondary | ICD-10-CM | POA: Diagnosis present

## 2020-02-15 DIAGNOSIS — F17210 Nicotine dependence, cigarettes, uncomplicated: Secondary | ICD-10-CM | POA: Diagnosis present

## 2020-02-15 DIAGNOSIS — R64 Cachexia: Secondary | ICD-10-CM | POA: Diagnosis present

## 2020-02-15 DIAGNOSIS — Z794 Long term (current) use of insulin: Secondary | ICD-10-CM

## 2020-02-15 DIAGNOSIS — T50916A Underdosing of multiple unspecified drugs, medicaments and biological substances, initial encounter: Secondary | ICD-10-CM | POA: Diagnosis present

## 2020-02-15 DIAGNOSIS — Z833 Family history of diabetes mellitus: Secondary | ICD-10-CM

## 2020-02-15 DIAGNOSIS — Z20822 Contact with and (suspected) exposure to covid-19: Secondary | ICD-10-CM | POA: Diagnosis present

## 2020-02-15 DIAGNOSIS — T43206A Underdosing of unspecified antidepressants, initial encounter: Secondary | ICD-10-CM | POA: Diagnosis present

## 2020-02-15 DIAGNOSIS — T383X6A Underdosing of insulin and oral hypoglycemic [antidiabetic] drugs, initial encounter: Secondary | ICD-10-CM | POA: Diagnosis present

## 2020-02-15 DIAGNOSIS — F4310 Post-traumatic stress disorder, unspecified: Secondary | ICD-10-CM | POA: Diagnosis present

## 2020-02-15 DIAGNOSIS — R8281 Pyuria: Secondary | ICD-10-CM | POA: Diagnosis present

## 2020-02-15 DIAGNOSIS — Z91128 Patient's intentional underdosing of medication regimen for other reason: Secondary | ICD-10-CM

## 2020-02-15 DIAGNOSIS — R54 Age-related physical debility: Secondary | ICD-10-CM | POA: Diagnosis present

## 2020-02-15 DIAGNOSIS — Z79899 Other long term (current) drug therapy: Secondary | ICD-10-CM

## 2020-02-15 LAB — BASIC METABOLIC PANEL WITH GFR
Anion gap: 13 (ref 5–15)
Anion gap: 11 (ref 5–15)
Anion gap: 10 (ref 5–15)
BUN: 33 mg/dL — ABNORMAL HIGH (ref 8–23)
BUN: 31 mg/dL — ABNORMAL HIGH (ref 8–23)
BUN: 29 mg/dL — ABNORMAL HIGH (ref 8–23)
CO2: 22 mmol/L (ref 22–32)
CO2: 23 mmol/L (ref 22–32)
CO2: 25 mmol/L (ref 22–32)
Calcium: 8.7 mg/dL — ABNORMAL LOW (ref 8.9–10.3)
Calcium: 8.4 mg/dL — ABNORMAL LOW (ref 8.9–10.3)
Calcium: 8.7 mg/dL — ABNORMAL LOW (ref 8.9–10.3)
Chloride: 100 mmol/L (ref 98–111)
Chloride: 101 mmol/L (ref 98–111)
Chloride: 103 mmol/L (ref 98–111)
Creatinine, Ser: 1.07 mg/dL (ref 0.61–1.24)
Creatinine, Ser: 0.98 mg/dL (ref 0.61–1.24)
Creatinine, Ser: 0.81 mg/dL (ref 0.61–1.24)
GFR, Estimated: >60 mL/min
GFR, Estimated: >60 mL/min
GFR, Estimated: >60 mL/min
Glucose, Bld: 375 mg/dL — ABNORMAL HIGH (ref 70–99)
Glucose, Bld: 232 mg/dL — ABNORMAL HIGH (ref 70–99)
Glucose, Bld: 191 mg/dL — ABNORMAL HIGH (ref 70–99)
Potassium: 4.2 mmol/L (ref 3.5–5.1)
Potassium: 3.6 mmol/L (ref 3.5–5.1)
Potassium: 3.7 mmol/L (ref 3.5–5.1)
Sodium: 135 mmol/L (ref 135–145)
Sodium: 135 mmol/L (ref 135–145)
Sodium: 138 mmol/L (ref 135–145)

## 2020-02-15 LAB — CBC WITH DIFFERENTIAL/PLATELET
Abs Immature Granulocytes: 0.03 10*3/uL (ref 0.00–0.07)
Basophils Absolute: 0 10*3/uL (ref 0.0–0.1)
Basophils Relative: 0 %
Eosinophils Absolute: 0 10*3/uL (ref 0.0–0.5)
Eosinophils Relative: 0 %
HCT: 37.4 % — ABNORMAL LOW (ref 39.0–52.0)
Hemoglobin: 12.9 g/dL — ABNORMAL LOW (ref 13.0–17.0)
Immature Granulocytes: 0 %
Lymphocytes Relative: 12 %
Lymphs Abs: 1.2 10*3/uL (ref 0.7–4.0)
MCH: 29.9 pg (ref 26.0–34.0)
MCHC: 34.5 g/dL (ref 30.0–36.0)
MCV: 86.8 fL (ref 80.0–100.0)
Monocytes Absolute: 0.6 10*3/uL (ref 0.1–1.0)
Monocytes Relative: 6 %
Neutro Abs: 8.1 10*3/uL — ABNORMAL HIGH (ref 1.7–7.7)
Neutrophils Relative %: 82 %
Platelets: 105 10*3/uL — ABNORMAL LOW (ref 150–400)
RBC: 4.31 MIL/uL (ref 4.22–5.81)
RDW: 13.1 % (ref 11.5–15.5)
WBC: 10.1 10*3/uL (ref 4.0–10.5)
nRBC: 0 % (ref 0.0–0.2)

## 2020-02-15 LAB — CBG MONITORING, ED
Glucose-Capillary: 523 mg/dL (ref 70–99)
Glucose-Capillary: 488 mg/dL — ABNORMAL HIGH (ref 70–99)
Glucose-Capillary: 433 mg/dL — ABNORMAL HIGH (ref 70–99)
Glucose-Capillary: 344 mg/dL — ABNORMAL HIGH (ref 70–99)
Glucose-Capillary: 249 mg/dL — ABNORMAL HIGH (ref 70–99)
Glucose-Capillary: 239 mg/dL — ABNORMAL HIGH (ref 70–99)
Glucose-Capillary: 225 mg/dL — ABNORMAL HIGH (ref 70–99)
Glucose-Capillary: 177 mg/dL — ABNORMAL HIGH (ref 70–99)
Glucose-Capillary: 197 mg/dL — ABNORMAL HIGH (ref 70–99)
Glucose-Capillary: 199 mg/dL — ABNORMAL HIGH (ref 70–99)
Glucose-Capillary: 179 mg/dL — ABNORMAL HIGH (ref 70–99)
Glucose-Capillary: 227 mg/dL — ABNORMAL HIGH (ref 70–99)
Glucose-Capillary: 362 mg/dL — ABNORMAL HIGH (ref 70–99)

## 2020-02-15 LAB — URINALYSIS, ROUTINE W REFLEX MICROSCOPIC
Bilirubin Urine: NEGATIVE
Glucose, UA: >=500 mg/dL — AB
Ketones, ur: 5 mg/dL — AB
Nitrite: NEGATIVE
Protein, ur: NEGATIVE mg/dL
Specific Gravity, Urine: 1.022 (ref 1.005–1.030)
WBC, UA: >50 WBC/hpf — ABNORMAL HIGH (ref 0–5)
pH: 7 (ref 5.0–8.0)

## 2020-02-15 LAB — COMPREHENSIVE METABOLIC PANEL
ALT: 15 U/L (ref 0–44)
AST: 13 U/L — ABNORMAL LOW (ref 15–41)
Albumin: 3.3 g/dL — ABNORMAL LOW (ref 3.5–5.0)
Alkaline Phosphatase: 116 U/L (ref 38–126)
Anion gap: 17 — ABNORMAL HIGH (ref 5–15)
BUN: 32 mg/dL — ABNORMAL HIGH (ref 8–23)
CO2: 21 mmol/L — ABNORMAL LOW (ref 22–32)
Calcium: 9.2 mg/dL (ref 8.9–10.3)
Chloride: 94 mmol/L — ABNORMAL LOW (ref 98–111)
Creatinine, Ser: 1.17 mg/dL (ref 0.61–1.24)
GFR, Estimated: >60 mL/min (ref >60)
Glucose, Bld: 533 mg/dL (ref 70–99)
Potassium: 4.2 mmol/L (ref 3.5–5.1)
Sodium: 132 mmol/L — ABNORMAL LOW (ref 135–145)
Total Bilirubin: 2 mg/dL — ABNORMAL HIGH (ref 0.3–1.2)
Total Protein: 7.1 g/dL (ref 6.5–8.1)

## 2020-02-15 LAB — BLOOD GAS, VENOUS
Acid-base deficit: 0.4 mmol/L (ref 0.0–2.0)
Bicarbonate: 24.2 mmol/L (ref 20.0–28.0)
O2 Saturation: 71.6 %
Patient temperature: 98.6
pCO2, Ven: 41.5 mmHg — ABNORMAL LOW (ref 44.0–60.0)
pH, Ven: 7.383 (ref 7.250–7.430)
pO2, Ven: 39.5 mmHg (ref 32.0–45.0)

## 2020-02-15 LAB — RESPIRATORY PANEL BY RT PCR (FLU A&B, COVID)
Influenza A by PCR: NEGATIVE
Influenza B by PCR: NEGATIVE
SARS Coronavirus 2 by RT PCR: NEGATIVE

## 2020-02-15 LAB — BETA-HYDROXYBUTYRIC ACID
Beta-Hydroxybutyric Acid: 3.12 mmol/L — ABNORMAL HIGH (ref 0.05–0.27)
Beta-Hydroxybutyric Acid: 1.69 mmol/L — ABNORMAL HIGH (ref 0.05–0.27)
Beta-Hydroxybutyric Acid: 0.24 mmol/L (ref 0.05–0.27)

## 2020-02-15 LAB — LACTIC ACID, PLASMA
Lactic Acid, Venous: 2.1 mmol/L (ref 0.5–1.9)
Lactic Acid, Venous: 3.4 mmol/L (ref 0.5–1.9)

## 2020-02-15 LAB — GLUCOSE, CAPILLARY
Glucose-Capillary: 299 mg/dL — ABNORMAL HIGH (ref 70–99)
Glucose-Capillary: 449 mg/dL — ABNORMAL HIGH (ref 70–99)
Glucose-Capillary: 458 mg/dL — ABNORMAL HIGH (ref 70–99)

## 2020-02-15 LAB — HIV ANTIBODY (ROUTINE TESTING W REFLEX): HIV Screen 4th Generation wRfx: NONREACTIVE

## 2020-02-15 MED ORDER — INSULIN GLARGINE 100 UNIT/ML ~~LOC~~ SOLN
40.0000 [IU] | Freq: Two times a day (BID) | SUBCUTANEOUS | Status: DC
  Administered 2020-02-15 – 2020-02-16 (×3): 40 [IU] via SUBCUTANEOUS
  Filled 2020-02-15 (×4): qty 0.4

## 2020-02-15 MED ORDER — LACTATED RINGERS IV BOLUS
1000.0000 mL | Freq: Once | INTRAVENOUS | Status: AC
  Administered 2020-02-15: 1000 mL via INTRAVENOUS

## 2020-02-15 MED ORDER — GABAPENTIN 300 MG PO CAPS
900.0000 mg | ORAL_CAPSULE | Freq: Every day | ORAL | Status: DC
  Administered 2020-02-15 – 2020-02-18 (×4): 900 mg via ORAL
  Filled 2020-02-15 (×4): qty 3

## 2020-02-15 MED ORDER — DEXTROSE 50 % IV SOLN: 0.0000 mL | INTRAVENOUS | Status: DC | PRN

## 2020-02-15 MED ORDER — INSULIN ASPART 100 UNIT/ML ~~LOC~~ SOLN
0.0000 [IU] | Freq: Three times a day (TID) | SUBCUTANEOUS | Status: DC
  Administered 2020-02-15 – 2020-02-16 (×2): 8 [IU] via SUBCUTANEOUS
  Administered 2020-02-16: 11 [IU] via SUBCUTANEOUS
  Administered 2020-02-16 – 2020-02-17 (×2): 8 [IU] via SUBCUTANEOUS
  Administered 2020-02-17 – 2020-02-18 (×4): 2 [IU] via SUBCUTANEOUS
  Administered 2020-02-19 (×2): 3 [IU] via SUBCUTANEOUS
  Filled 2020-02-15: qty 0.15

## 2020-02-15 MED ORDER — POTASSIUM CHLORIDE 10 MEQ/100ML IV SOLN
10.0000 meq | INTRAVENOUS | Status: AC
  Administered 2020-02-15 (×2): 10 meq via INTRAVENOUS
  Filled 2020-02-15 (×3): qty 100

## 2020-02-15 MED ORDER — ENOXAPARIN SODIUM 40 MG/0.4ML ~~LOC~~ SOLN
40.0000 mg | SUBCUTANEOUS | Status: DC
  Administered 2020-02-15 – 2020-02-19 (×5): 40 mg via SUBCUTANEOUS
  Filled 2020-02-15 (×5): qty 0.4

## 2020-02-15 MED ORDER — INSULIN REGULAR(HUMAN) IN NACL 100-0.9 UT/100ML-% IV SOLN
INTRAVENOUS | Status: DC
  Administered 2020-02-15: 13 [IU]/h via INTRAVENOUS
  Filled 2020-02-15: qty 100

## 2020-02-15 MED ORDER — LACTATED RINGERS IV SOLN: INTRAVENOUS | Status: DC

## 2020-02-15 MED ORDER — INSULIN ASPART 100 UNIT/ML ~~LOC~~ SOLN: 10.0000 [IU] | Freq: Once | SUBCUTANEOUS | Status: DC

## 2020-02-15 MED ORDER — SODIUM CHLORIDE 0.9 % IV BOLUS
500.0000 mL | Freq: Once | INTRAVENOUS | Status: AC
  Administered 2020-02-15: 500 mL via INTRAVENOUS

## 2020-02-15 MED ORDER — DEXTROSE IN LACTATED RINGERS 5 % IV SOLN: INTRAVENOUS | Status: DC

## 2020-02-15 MED ORDER — INSULIN ASPART 100 UNIT/ML ~~LOC~~ SOLN
8.0000 [IU] | Freq: Once | SUBCUTANEOUS | Status: AC
  Administered 2020-02-15: 8 [IU] via SUBCUTANEOUS

## 2020-02-15 NOTE — ED Notes (Signed)
344 POCT CBG Recheck. [name], NT

## 2020-02-15 NOTE — ED Triage Notes (Signed)
Patient arrived via [facility] with complaints of fatigue over the last 6 days. Patient is non complaint with meds. Blood sugar with EMS was 565. 500cc bolus given prior to arrival.

## 2020-02-15 NOTE — Progress Notes (Signed)
Inpatient Diabetes Program Recommendations  AACE/ADA: New Consensus Statement on Inpatient Glycemic Control (2015)  Target Ranges:  Prepandial:   less than 140 mg/dL      Peak postprandial:   less than 180 mg/dL (1-2 hours)      Critically ill patients:  140 - 180 mg/dL   Lab Results  Component Value Date   GLUCAP 225 (H) 02/15/2020   HGBA1C 12.0 (H) 08/06/2016    Review of Glycemic Control  Diabetes history: DM2 Outpatient Diabetes medications: Lantus 40 units bid Current orders for Inpatient glycemic control: IV insulin  Inpatient Diabetes Program Recommendations:    Noted pending A1c Consider for transition when patient meets criteria: -Lantus 40 units bid -Add Novolog 5-10 units tid meal coverage when eating 50% meals -Novolog moderate correction 0-15 tid + hs 0-5 units when eating  Secure chat sent to Dr. Ella Jubilee.  Thank you, Wayne Benson. Wayne Desire, RN, MSN, CDE  Diabetes Coordinator Inpatient Glycemic Control Team Team Pager [phone] (8am-5pm) 02/15/2020 9:26 AM

## 2020-02-15 NOTE — ED Notes (Signed)
Pt transferred to hospital bed, repositioned self. Pt given another blanket. Call light within reach.

## 2020-02-15 NOTE — ED Provider Notes (Signed)
[facility]   CSN: [id number] Arrival date & time: 02/15/20  0106     History Chief Complaint  Patient presents with  . Hyperglycemia    Wayne Benson is a 64 y.o. male with a history of DM, cirrhosis secondary to NASH, anemia, schizophrenia, GERD, hypercholesterolemia, & thrombocytopenia who presents to the ED via EMS with concern for abnormal blood sugars at home.  Patient states he has been feeling poorly over the past 4 to 5 days with fatigue, polydipsia, and decreased appetite, he has had minimal to no p.o. intake, has not been utilizing his insulin.  No alleviating or aggravating factors.  He also mentions his chronic shortness of breath which occurs intermittently and is unchanged.  He denies fever, chills, nausea, vomiting, abdominal pain, chest pain, cough, dysuria, syncope, melena, or hematochezia.  He states he has diarrhea which is chronic.   HPI     Past Medical History:  Diagnosis Date  . Anemia   . Anxiety   . Bipolar disorder (HCC)   . Bronchial asthma    "get it just about q yr" (08/06/2016)  . Depression   . Diabetes mellitus without complication (HCC)   . DVT (deep venous thrombosis) (HCC)   . Esophageal varices (HCC)   . GERD (gastroesophageal reflux disease)   . History of blood transfusion    "related to cirrhosis"  . Hypercholesteremia   . Liver cirrhosis secondary to NASH (HCC)    hx/notes 08/06/2016  . Non-alcoholic cirrhosis (HCC)   . Peripheral neuropathy   . Schizophrenia (HCC)   . Thrombocytopenia [facility])     Patient Active Problem List   Diagnosis Date Noted  . Hypoglycemia 08/23/2016  . ILD (interstitial lung disease) (HCC)   . Hypoxemia   . Thrombocytopenia (HCC) 08/06/2016  . Diabetes (HCC) 01/20/2016  . Esophageal varices in cirrhosis (HCC) 01/20/2016  . Tobacco use disorder 01/20/2016  . GERD (gastroesophageal reflux disease) 01/20/2016  . Liver cirrhosis secondary to NASH (HCC)  01/20/2016    Past Surgical History:  Procedure Laterality Date  . CARDIAC CATHETERIZATION     "@ the [place]":  Marland Kitchen CATARACT EXTRACTION W/ INTRAOCULAR LENS IMPLANT Right   . ESOPHAGOGASTRODUODENOSCOPY (EGD) WITH PROPOFOL Left 01/21/2016   Procedure: ESOPHAGOGASTRODUODENOSCOPY (EGD) WITH PROPOFOL;  Surgeon: Willis Modena, MD;  Location: [facility] ENDOSCOPY;  Service: Endoscopy;  Laterality: Left;  . FINGER SURGERY Right 1990s?   "thumb; sliced it w/a blade"  . IVC FILTER PLACEMENT ([facility] HX)    . US GUIDED PARACENTESIS ([facility] HX)         Family History  Problem Relation Age of Onset  . Congestive Heart Failure Mother   . Neuropathy Mother   . Heart disease Sister   . Diabetes Brother   . Diabetes Brother   . Heart disease Brother   . Neuropathy Brother   . Lung disease Neg Hx   . Rheumatologic disease Neg Hx     Social History   Tobacco Use  . Smoking status: Current Some Day Smoker    Packs/day: 0.25    Years: 11.00    Pack years: 2.75    Types: Cigarettes, Cigars    Start date: 06/11/2005  . Smokeless tobacco: Never Used  . Tobacco comment: Still smoking pipe tobacco cigars intermittently  Substance Use Topics  . Alcohol use: No  . Drug use: No    Home Medications Prior to Admission medications   Medication Sig Start Date End Date Taking?  Authorizing Provider  acetaminophen (TYLENOL) 500 MG tablet Take 1,500 mg by mouth every 6 (six) hours as needed for mild pain.    [provider]  albuterol (PROVENTIL HFA;VENTOLIN HFA) 108 (90 Base) MCG/ACT inhaler Inhale 2 puffs into the lungs every 6 (six) hours as needed for wheezing or shortness of breath. 08/27/16   Molt, Bethany, DO  cetirizine (ZYRTEC) 10 MG tablet Take 10 mg by mouth daily.    [provider]  chlorpheniramine-HYDROcodone (TUSSIONEX) 10-8 MG/5ML SUER Take 5 mLs by mouth every 12 (twelve) hours as needed for cough. Patient not taking: Reported on 01/29/2017 08/21/16   Molt, Bethany, DO  cyanocobalamin  500 MCG tablet Take 500 mcg by mouth daily.    [provider]  feeding supplement, GLUCERNA SHAKE, (GLUCERNA SHAKE) LIQD Take 237 mLs by mouth 3 (three) times daily between meals. 08/21/16   Molt, Bethany, DO  ferrous sulfate 325 (65 FE) MG tablet Take 325 mg by mouth daily with breakfast.    [provider]  folic acid (FOLVITE) 1 MG tablet Take 1 mg by mouth daily.    [provider]  gabapentin (NEURONTIN) 300 MG capsule Take 900 mg by mouth at bedtime.    [provider]  guaiFENesin (MUCINEX) 600 MG 12 hr tablet Take 1 tablet (600 mg total) by mouth 2 (two) times daily. 08/27/16   Molt, Bethany, DO  insulin aspart (NOVOLOG) 100 UNIT/ML injection Inject 0-15 Units into the skin 3 (three) times daily with meals. 09/02/16   Molt, Bethany, DO  insulin aspart (NOVOLOG) 100 UNIT/ML injection Inject 0-5 Units into the skin at bedtime. 09/02/16   Molt, Bethany, DO  insulin aspart (NOVOLOG) 100 UNIT/ML injection Inject 15 Units into the skin 3 (three) times daily with meals. 09/02/16   Molt, Bethany, DO  insulin glargine (LANTUS) 100 UNIT/ML injection Inject 0.45 mLs (45 Units total) into the skin 2 (two) times daily. 09/02/16   Molt, Bethany, DO  ipratropium-albuterol (DUONEB) 0.5-2.5 (3) MG/3ML SOLN Take 3 mLs by nebulization every 6 (six) hours as needed. 08/28/16   Molt, Bethany, DO  ipratropium-albuterol (DUONEB) 0.5-2.5 (3) MG/3ML SOLN Take 3 mLs by nebulization every 6 (six) hours as needed. 09/02/16   Molt, Bethany, DO  nadolol (CORGARD) 20 MG tablet Take 20 mg by mouth daily.    [provider]  ondansetron (ZOFRAN) 8 MG tablet Take 8 mg by mouth every 8 (eight) hours as needed for nausea or vomiting.    [provider]  pantoprazole (PROTONIX) 40 MG tablet Take 40 mg by mouth 2 (two) times daily.    [provider]  simvastatin (ZOCOR) 20 MG tablet Take 20 mg by mouth at bedtime.    [provider]  TRAZODONE HCL PO Take 25 mg by  mouth at bedtime.    [provider]  zolpidem (AMBIEN) 5 MG tablet Take 5 mg by mouth at bedtime as needed for sleep.    [provider]    Allergies    Duloxetine hcl  Review of Systems   Review of Systems  Constitutional: Positive for appetite change and fatigue. Negative for chills and fever.  Respiratory: Positive for shortness of breath (Chronic unchanged.).   Cardiovascular: Negative for chest pain.  Gastrointestinal: Positive for diarrhea (Chronic unchanged.). Negative for abdominal pain, blood in stool, constipation, nausea and vomiting.  Endocrine: Positive for polydipsia. Negative for polyuria.  Genitourinary: Negative for dysuria, hematuria, scrotal swelling and testicular pain.  Neurological: Negative for syncope.  All other systems reviewed and are negative.   Physical Exam Updated Vital Signs BP (!) 106/45 (BP Location: Left Arm)   Pulse 90   Temp 98.7 F (37.1 C) (Oral)   Resp 16   SpO2 99%   Physical Exam Vitals and nursing note reviewed.  Constitutional:      General: He is not in acute distress.    Appearance: He is well-developed. He is not toxic-appearing.  HENT:     Head: Normocephalic and atraumatic.     Mouth/Throat:     Mouth: Mucous membranes are dry.  Eyes:     General:        Right eye: No discharge.        Left eye: No discharge.     Conjunctiva/sclera: Conjunctivae normal.  Cardiovascular:     Rate and Rhythm: Normal rate and regular rhythm.  Pulmonary:     Effort: Pulmonary effort is normal. No respiratory distress.     Breath sounds: Normal breath sounds. No wheezing, rhonchi or rales.  Abdominal:     General: There is no distension.     Palpations: Abdomen is soft.     Tenderness: There is no abdominal tenderness. There is no guarding or rebound.  Musculoskeletal:     Cervical back: Neck supple.     Right lower leg: No edema.     Left lower leg: No edema.  Skin:    General: Skin is warm and dry.     Findings:  No rash.  Neurological:     Mental Status: He is alert.     Comments: Clear speech.   Psychiatric:        Behavior: Behavior normal.     ED Results / Procedures / Treatments   Labs (all labs ordered are listed, but only abnormal results are displayed) Labs Reviewed  CBC WITH DIFFERENTIAL/PLATELET - Abnormal; Notable for the following components:      Result Value   Hemoglobin 12.9 (*)    HCT 37.4 (*)    Platelets 105 (*)    Neutro Abs 8.1 (*)    All other components within normal limits  COMPREHENSIVE METABOLIC PANEL - Abnormal; Notable for the following components:   Sodium 132 (*)    Chloride 94 (*)    CO2 21 (*)    Glucose, Bld 533 (*)    BUN 32 (*)    Albumin 3.3 (*)    AST 13 (*)    Total Bilirubin 2.0 (*)    Anion gap 17 (*)    All other components within normal limits  URINALYSIS, ROUTINE W REFLEX MICROSCOPIC - Abnormal; Notable for the following components:   APPearance HAZY (*)    Glucose, UA >=500 (*)    Hgb urine dipstick SMALL (*)    Ketones, ur 5 (*)    Leukocytes,Ua MODERATE (*)    WBC, UA >50 (*)    Bacteria, UA RARE (*)    All other components within normal limits  LACTIC ACID, PLASMA - Abnormal; Notable for the following components:   Lactic Acid, Venous 2.1 (*)    All other components within normal limits  BETA-HYDROXYBUTYRIC ACID - Abnormal; Notable for the following components:   Beta-Hydroxybutyric Acid 3.12 (*)    All other components within normal limits  BLOOD GAS, VENOUS - Abnormal; Notable for the following components:   pCO2, Ven 41.5 (*)    All other components within normal limits  CBG MONITORING, ED - Abnormal; Notable for the following  components:   Glucose-Capillary 523 (*)    All other components within normal limits  CBG MONITORING, ED - Abnormal; Notable for the following components:   Glucose-Capillary 488 (*)    All other components within normal limits  CBG MONITORING, ED - Abnormal; Notable for the following components:    Glucose-Capillary 433 (*)    All other components within normal limits  RESPIRATORY PANEL BY RT PCR (FLU A&B, COVID)  URINE CULTURE  LACTIC ACID, PLASMA  HIV ANTIBODY (ROUTINE TESTING W REFLEX)  BASIC METABOLIC PANEL  BASIC METABOLIC PANEL  BASIC METABOLIC PANEL  BASIC METABOLIC PANEL  BASIC METABOLIC PANEL  BETA-HYDROXYBUTYRIC ACID  BETA-HYDROXYBUTYRIC ACID  BETA-HYDROXYBUTYRIC ACID  HEMOGLOBIN A1C    EKG EKG Interpretation  Date/Time:  Tuesday February 15 2020 01:35:06 EDT Ventricular Rate:  90 PR Interval:    QRS Duration: 92 QT Interval:  381 QTC Calculation: 467 R Axis:   81 Text Interpretation: Sinus rhythm Multiple ventricular premature complexes Biatrial enlargement Borderline right axis deviation Borderline repolarization abnormality Baseline wander in lead(s) I II aVR aVF V4 Confirmed by Devoria Albe ([id number]) on 02/15/2020 2:00:59 AM   Radiology DG Chest Portable 1 View  Result Date: 02/15/2020 CLINICAL DATA:  Dyspnea.  Hyperglycemia. EXAM: PORTABLE CHEST 1 VIEW COMPARISON:  Radiograph 08/23/2016. High-resolution chest CT 10/22/2016 FINDINGS: Patient is rotated. The heart is normal in size. Mild aortic tortuosity. Previous subpleural opacities prior chest CT have resolved. No acute airspace disease. Multiple skin folds project over the left chest. No pulmonary edema. No pleural effusion. No acute osseous abnormalities are seen. IMPRESSION: 1. No acute abnormality. 2. Multiple skin folds project over the left chest. Electronically Signed   By: Narda Rutherford M.D.   On: 02/15/2020 01:49    Procedures .Critical Care Performed by: Cherly Anderson, PA-C Authorized by: Cherly Anderson, PA-C      CRITICAL CARE Performed by: Harvie Heck   Total critical care time: 35 minutes  Critical care time was exclusive of separately billable procedures and treating other patients.  Critical care was necessary to treat or prevent imminent or  life-threatening deterioration.  Critical care was time spent personally by me on the following activities: development of treatment plan with patient and/or surrogate as well as nursing, discussions with consultants, evaluation of patient's response to treatment, examination of patient, obtaining history from patient or surrogate, ordering and performing treatments and interventions, ordering and review of laboratory studies, ordering and review of radiographic studies, pulse oximetry and re-evaluation of patient's condition.   (including critical care time)  Medications Ordered in ED Medications  insulin regular, human (MYXREDLIN) 100 units/ 100 mL infusion (13 Units/hr Intravenous New Bag/Given 02/15/20 0401)  lactated ringers infusion ( Intravenous New Bag/Given 02/15/20 0356)  dextrose 5 % in lactated ringers infusion (0 mLs Intravenous Hold 02/15/20 0326)  dextrose 50 % solution 0-50 mL (has no administration in time range)  potassium chloride 10 mEq in 100 mL IVPB (0 mEq Intravenous Stopped 02/15/20 0457)  enoxaparin (LOVENOX) injection 40 mg (has no administration in time range)  sodium chloride 0.9 % bolus 500 mL (0 mLs Intravenous Stopped 02/15/20 0235)  lactated ringers bolus 1,000 mL (1,000 mLs Intravenous New Bag/Given (Non-Interop) 02/15/20 0510)    ED Course  I have reviewed the triage vital signs and the nursing notes.  Pertinent labs & imaging results that were available during my care of the patient were reviewed by me and considered in my medical decision making (see chart for details).  MDM Rules/Calculators/A&P                          Patient presents to the ED with complaints of fatigue and concern for abnormal blood sugar.  Patient is nontoxic, vitals without significant abnormality, BP mildly soft however appears fairly similar to prior.  On exam patient is nontoxic, resting comfortably, his mucous membranes are dry, exam is otherwise benign.  Additional  history obtained:  Additional history obtained from nursing note review and chart review. Previous records obtained and reviewed  Last echo cardiogram on record is from April 2018 with an EF of 50 to 55%.  EKG: Sinus rhythm, no STEMI. Lab Tests:  I Ordered, reviewed, and interpreted labs, which included:  CBG: 523  CBC: Mild anemia similar to prior ranges CMP: Mild changes concerning for DKA Lactic acid: Mildly elevated Urinalysis: Pyuria - no urinary sxs or leukocytosis, pending culture. Ketonuria & glucosuria.   Imaging Studies ordered:  I ordered imaging studies which included CXR, I independently visualized and interpreted imaging which showed no acute abnormality  Mild DKA. Started insulin per endotool.  Patient would prefer to stay in the hosplital.  04:29: CONSULT: [facility] Dr[facility] Julian ReilGardner- accepts admission.   Findings and plan of care discussed with supervising physician Dr. Lynelle DoctorKnapp who has evaluated patient as shared visit & is in agreement.   Portions of this note were generated with Scientist, clinical (histocompatibility and immunogenetics)Dragon dictation software. Dictation errors may occur despite best attempts at proofreading.  Final Clinical Impression(s) / ED Diagnoses Final diagnoses:  Diabetic ketoacidosis without coma associated with type 2 diabetes mellitus [facility](HCC)    Rx / DC Orders ED Discharge Orders    None       Cherly Andersonetrucelli, Kayia Billinger R, PA-C 02/15/20 0527    Devoria AlbeKnapp, Iva, MD 02/15/20 [phone]

## 2020-02-15 NOTE — Progress Notes (Signed)
PROGRESS NOTE    Wayne Benson  NOB:[id number] DOB: 1956-01-08 DOA: 02/15/2020 [facility]CP: [facility]    Brief Narrative:  Wayne Benson was admitted to the hospital with a working diagnosis of diabetes ketoacidosis.  64 year old male with past medical history for type 2 diabetes mellitus, nonalcoholic steatohepatitis with cirrhosis.  Patient developed progressive fatigue for the last 6 days, he has been not taking his home insulin and his p.o. intake has been very poor.  Denied abdominal pain, nausea, vomiting or diarrhea.  On his initial physical examination blood pressure 101/43, heart rate 87, respiratory 20, oxygen saturation 98%, he had dry mucous membranes, lungs clear to auscultation bilaterally, heart S1-S2, present rhythmic, soft abdomen, no lower extremity edema. Sodium 132, potassium 4.2, chloride 93, bicarb 21, glucose 533, BUN 32, creatinine 1.17, white count 10.1, hemoglobin 12.9, hematocrit 37.4, platelets 105.  SARS COVID-19 negative.  Urinalysis more than 500 glucose, specific gravity 1.022, more than 50 white cells. Chest x-ray with hyperinflation, no infiltrates, faint scarring right upper lobe EKG 90 bpm, normal axis, normal intervals, sinus rhythm with positive PVCs, no ST segment or T wave changes.   Assessment & Plan:   Principal Problem:   DKA (diabetic ketoacidosis) (HCC) Active Problems:   Liver cirrhosis secondary to NASH (HCC)   Pyuria   1. Diabetes ketoacidosis in the setting of insulin dependent type 2 DM. Patient was placed on insulin drip per protocol with good toleration, his anion gap closes and he was transitioned to sq insulin  40 units bid glargine and insulin sliding scale, diet has been advanced with good toleration, will continue to calculate insulin requirements.  Diabetes decompensation due to poor compliance.  Resume gabapentin for diabetic neuropathy.   Consult PT and OT, out of bed to chair tid with meals..   2.  Cirrhosis. No  clinical signs of decompensated disease.   3. Pyuria. No urinary symptoms, continue to hold on antibiotic therapy    Patient continue to be at high risk for worsening hyperglycemia.   Status is: Inpatient  Remains inpatient appropriate because:IV treatments appropriate due to intensity of illness or inability to take PO   Dispo: The patient is from: Home              Anticipated d/c is to: Home              Anticipated d/c date is: 1 day              Patient currently is not medically stable to d/c.    DVT prophylaxis: Enoxaparin   Code Status:   full  Family Communication:  No family at the bedside      Subjective: Patient is feeling better but not yet back to baseline, no nausea or vomiting, he has been transitioned to sq insulin with good toleration.   Objective: Vitals:   02/15/20 1444 02/15/20 1600 02/15/20 1632 02/15/20 1655  BP: 116/61 (!) 119/96 120/66   Pulse: 88 86 87   Resp: 18  19   Temp:   98.2 F (36.8 C)   TempSrc:   Oral   SpO2: 94% 99% 100%   Height:    5\' 10"  (1.778 m)    Intake/Output Summary (Last 24 hours) at 02/15/2020 1657 Last data filed at 02/15/2020 1604 Gross per 24 hour  Intake 3026.66 ml  Output --  Net 3026.66 ml   There were no vitals filed for this visit.  Examination:   General: Not in pain  or dyspnea, deconditioned  Neurology: Awake and alert, non focal  E ENT: no pallor, no icterus, oral mucosa moist Cardiovascular: No JVD. S1-S2 present, rhythmic, no gallops, rubs, or murmurs. No lower extremity edema. Pulmonary: positive breath sounds bilaterally,with no wheezing, rhonchi or rales. Gastrointestinal. Abdomen soft and non tender Skin. No rashes Musculoskeletal: no joint deformities     Data Reviewed: I have personally reviewed following labs and imaging studies  CBC: Recent Labs  Lab 02/15/20 0133  WBC 10.1  NEUTROABS 8.1*  HGB 12.9*  HCT 37.4*  MCV 86.8  PLT 105*   Basic Metabolic Panel: Recent Labs   Lab 02/15/20 0133 02/15/20 0537 02/15/20 0833 02/15/20 1307  NA 132* 135 135 138  K 4.2 4.2 3.6 3.7  CL 94* 100 101 103  CO2 21* 22 23 25   GLUCOSE 533* 375* 232* 191*  BUN 32* 33* 31* 29*  CREATININE 1.17 1.07 0.98 0.81  CALCIUM 9.2 8.7* 8.4* 8.7*   GFR: CrCl cannot be calculated (Unknown ideal weight.). Liver Function Tests: Recent Labs  Lab 02/15/20 0133  AST 13*  ALT 15  ALKPHOS 116  BILITOT 2.0*  PROT 7.1  ALBUMIN 3.3*   No results for input(s): LIPASE, AMYLASE in the last 168 hours. No results for input(s): AMMONIA in the last 168 hours. Coagulation Profile: No results for input(s): INR, PROTIME in the last 168 hours. Cardiac Enzymes: No results for input(s): CKTOTAL, CKMB, CKMBINDEX, TROPONINI in the last 168 hours. BNP (last 3 results) No results for input(s): PROBNP in the last 8760 hours. HbA1C: No results for input(s): HGBA1C in the last 72 hours. CBG: Recent Labs  Lab 02/15/20 1120 02/15/20 1220 02/15/20 1314 02/15/20 1356 02/15/20 1600  GLUCAP 197* 199* 179* 227* 362*   Lipid Profile: No results for input(s): CHOL, HDL, LDLCALC, TRIG, CHOLHDL, LDLDIRECT in the last 72 hours. Thyroid Function Tests: No results for input(s): TSH, T4TOTAL, FREET4, T3FREE, THYROIDAB in the last 72 hours. Anemia Panel: No results for input(s): VITAMINB12, FOLATE, FERRITIN, TIBC, IRON, RETICCTPCT in the last 72 hours.    Radiology Studies: I have reviewed all of the imaging during this hospital visit personally     Scheduled Meds: . enoxaparin (LOVENOX) injection  40 mg Subcutaneous Q24H  . insulin aspart  0-15 Units Subcutaneous TID WC  . insulin glargine  40 Units Subcutaneous BID   Continuous Infusions: . dextrose 5% lactated ringers Stopped (02/15/20 1604)  . insulin Stopped (02/15/20 1604)     LOS: 0 days        Mikella Linsley 02/17/20, MD

## 2020-02-15 NOTE — H&P (Signed)
History and Physical    Wayne JunglingDuke L Cwik ZOX:[id number] DOB: April 07, 1956 DOA: 02/15/2020  [facility]  [place] coming from: [place]  I have personally briefly reviewed [facility]  Chief Complaint: Fatigue  HPI: Wayne JunglingDuke L Benson is a 64 y.o. male with medical history significant of DM2, NASH cirrhosis.  Pt admits to not taking any home insulin for the past 3-4 days.  Pt presents to ED with c/o fatigue.  Progressively worsening over the past 6 days.  Context of not taking home insulin.  Nothing makes better or worse.  Associated with high BGLs at home.  Poor PO intake.  No fever, chills, N/V, chest pain, cough, dysuria.  Has chronic diarrhea, and chronic SOB.   ED Course: Pt with DKA, BHB of 3.1, AG 17.  Also pyuria with mod LE, > 50 WBC, but rare bacteria and neg nitrites.   Review of Systems: As per HPI, otherwise all review of systems negative.  Past Medical History:  Diagnosis Date  . Anemia   . Anxiety   . Bipolar disorder (HCC)   . Bronchial asthma    "get it just about q yr" (08/06/2016)  . Depression   . Diabetes mellitus without complication (HCC)   . DVT (deep venous thrombosis) (HCC)   . Esophageal varices (HCC)   . GERD (gastroesophageal reflux disease)   . History of blood transfusion    "related to cirrhosis"  . Hypercholesteremia   . Liver cirrhosis secondary to NASH (HCC)    hx/notes 08/06/2016  . Non-alcoholic cirrhosis (HCC)   . Peripheral neuropathy   . Schizophrenia (HCC)   . Thrombocytopenia (HCC)     Past Surgical History:  Procedure Laterality Date  . CARDIAC [facility]     [facility]  Mar[facility] Kitchen. CATARACT EXTRACTION W/ INTRAOCULAR LENS IMPLANT Right   . ESOPHAGOGASTRODUODENOSCOPY (EGD) WITH PROPOFOL Left 01/21/2016   Procedure: ESOPHAGOGASTRODUODENOSCOPY (EGD) WITH PROPOFOL;  Surgeon: Willis ModenaWilliam Outlaw, MD;  [facility];  Service: Endoscopy;  Laterality: Left;  . FINGER SURGERY Right  1990s?   "thumb; sliced it w/a blade"  . IVC FILTER PLACEMENT ([facility] HX)    . US GUIDED PARACENTESIS ([facility] HX)       reports that he has been smoking cigarettes and cigars. He started smoking about 14 years ago. He has a 2.75 pack-year smoking history. He has never used smokeless tobacco. He reports that he does not drink alcohol and does not use drugs.  Allergies  Allergen Reactions  . Duloxetine     Other reaction(s): Other (See Comments) Hallucinations Hallucinations   . Pregabalin     Other reaction(s): Other (See Comments) hallucinations hallucinations   . Trazodone Hives, Nausea Only and Nausea And Vomiting    Family History  Problem Relation Age of Onset  . Congestive Heart Failure Mother   . Neuropathy Mother   . Heart disease Sister   . Diabetes Brother   . Diabetes Brother   . Heart disease Brother   . Neuropathy Brother   . Lung disease Neg Hx   . Rheumatologic disease Neg Hx      Prior to Admission medications   Medication Sig Start Date End Date Taking? Authorizing Provider  acetaminophen (TYLENOL) 500 MG tablet Take 1,500 mg by mouth every 6 (six) hours as needed for mild pain.   Yes [provider]  gabapentin (NEURONTIN) 300 MG capsule Take 900 mg by mouth at bedtime.   Yes [provider]  insulin glargine (LANTUS) 100 UNIT/ML injection Inject 0.45 mLs (45 Units total) into the skin 2 (two) times daily. Patient taking differently: Inject 40 Units into the skin 2 (two) times daily.  09/02/16  Yes Molt, Bethany, DO  albuterol (PROVENTIL HFA;VENTOLIN HFA) 108 (90 Base) MCG/ACT inhaler Inhale 2 puffs into the lungs every 6 (six) hours as needed for wheezing or shortness of breath. Patient not taking: Reported on 02/15/2020 08/27/16   Molt, Bethany, DO  chlorpheniramine-HYDROcodone (TUSSIONEX) 10-8 MG/5ML SUER Take 5 mLs by mouth every 12 (twelve) hours as needed for cough. Patient not taking: Reported on 01/29/2017 08/21/16   Molt, Bethany, DO    feeding supplement, GLUCERNA SHAKE, (GLUCERNA SHAKE) LIQD Take 237 mLs by mouth 3 (three) times daily between meals. Patient not taking: Reported on 02/15/2020 08/21/16   Molt, Bethany, DO  guaiFENesin (MUCINEX) 600 MG 12 hr tablet Take 1 tablet (600 mg total) by mouth 2 (two) times daily. Patient not taking: Reported on 02/15/2020 08/27/16   Molt, Bethany, DO  insulin aspart (NOVOLOG) 100 UNIT/ML injection Inject 0-15 Units into the skin 3 (three) times daily with meals. Patient not taking: Reported on 02/15/2020 09/02/16   Molt, Bethany, DO  insulin aspart (NOVOLOG) 100 UNIT/ML injection Inject 0-5 Units into the skin at bedtime. Patient not taking: Reported on 02/15/2020 09/02/16   Molt, Bethany, DO  insulin aspart (NOVOLOG) 100 UNIT/ML injection Inject 15 Units into the skin 3 (three) times daily with meals. Patient not taking: Reported on 02/15/2020 09/02/16   Molt, Bethany, DO  ipratropium-albuterol (DUONEB) 0.5-2.5 (3) MG/3ML SOLN Take 3 mLs by nebulization every 6 (six) hours as needed. Patient not taking: Reported on 02/15/2020 08/28/16   Molt, Bethany, DO  ipratropium-albuterol (DUONEB) 0.5-2.5 (3) MG/3ML SOLN Take 3 mLs by nebulization every 6 (six) hours as needed. Patient not taking: Reported on 02/15/2020 09/02/16   MoltToma Copier, DO    Physical Exam: Vitals:   02/15/20 0230 02/15/20 0300 02/15/20 0330 02/15/20 0400  BP: (!) 101/43 129/89 120/65 (!) 113/57  Pulse: 87 (!) 102  78  Resp: 20 15  18   Temp:      TempSrc:      SpO2: 95% 98% 98% 96%    Constitutional: NAD, calm, comfortable Eyes: PERRL, lids and conjunctivae normal ENMT: Mucous membranes are Dry. Posterior pharynx clear of any exudate or lesions.Normal dentition.  Neck: normal, supple, no masses, no thyromegaly Respiratory: clear to auscultation bilaterally, no wheezing, no crackles. Normal respiratory effort. No accessory muscle use.  Cardiovascular: Regular rate and rhythm, no murmurs / rubs / gallops. No extremity  edema. 2+ pedal pulses. No carotid bruits.  Abdomen: no tenderness, no masses palpated. No hepatosplenomegaly. Bowel sounds positive.  Musculoskeletal: no clubbing / cyanosis. No joint deformity upper and lower extremities. Good ROM, no contractures. Normal muscle tone.  Skin: no rashes, lesions, ulcers. No induration Neurologic: CN 2-12 grossly intact. Sensation intact, DTR normal. Strength 5/5 in all 4.  Psychiatric: Normal judgment and insight. Alert and oriented x 3. Normal mood.    Labs on Admission: I have personally reviewed following labs and imaging studies  CBC: Recent Labs  Lab 02/15/20 0133  WBC 10.1  NEUTROABS 8.1*  HGB 12.9*  HCT 37.4*  MCV 86.8  PLT 105*   Basic Metabolic Panel: Recent Labs  Lab 02/15/20 0133  NA 132*  K 4.2  CL 94*  CO2 21*  GLUCOSE 533*  BUN 32*  CREATININE 1.17  CALCIUM 9.2   GFR:  CrCl cannot be calculated (Unknown ideal weight.). Liver Function Tests: Recent Labs  Lab 02/15/20 0133  AST 13*  ALT 15  ALKPHOS 116  BILITOT 2.0*  PROT 7.1  ALBUMIN 3.3*   No results for input(s): LIPASE, AMYLASE in the last 168 hours. No results for input(s): AMMONIA in the last 168 hours. Coagulation Profile: No results for input(s): INR, PROTIME in the last 168 hours. Cardiac Enzymes: No results for input(s): CKTOTAL, CKMB, CKMBINDEX, TROPONINI in the last 168 hours. BNP (last 3 results) No results for input(s): PROBNP in the last 8760 hours. HbA1C: No results for input(s): HGBA1C in the last 72 hours. CBG: Recent Labs  Lab 02/15/20 0126 02/15/20 0352 02/15/20 0449  GLUCAP 523* 488* 433*   Lipid Profile: No results for input(s): CHOL, HDL, LDLCALC, TRIG, CHOLHDL, LDLDIRECT in the last 72 hours. Thyroid Function Tests: No results for input(s): TSH, T4TOTAL, FREET4, T3FREE, THYROIDAB in the last 72 hours. Anemia Panel: No results for input(s): VITAMINB12, FOLATE, FERRITIN, TIBC, IRON, RETICCTPCT in the last 72 hours. Urine  analysis:    Component Value Date/Time   COLORURINE YELLOW 02/15/2020 0354   APPEARANCEUR HAZY (A) 02/15/2020 0354   LABSPEC 1.022 02/15/2020 0354   PHURINE 7.0 02/15/2020 0354   GLUCOSEU >=500 (A) 02/15/2020 0354   HGBUR SMALL (A) 02/15/2020 0354   BILIRUBINUR NEGATIVE 02/15/2020 0354   KETONESUR 5 (A) 02/15/2020 0354   PROTEINUR NEGATIVE 02/15/2020 0354   NITRITE NEGATIVE 02/15/2020 0354   LEUKOCYTESUR MODERATE (A) 02/15/2020 0354    Radiological Exams on Admission: DG Chest Portable 1 View  Result Date: 02/15/2020 CLINICAL DATA:  Dyspnea.  Hyperglycemia. EXAM: PORTABLE CHEST 1 VIEW COMPARISON:  Radiograph 08/23/2016. High-resolution chest CT 10/22/2016 FINDINGS: Patient is rotated. The heart is normal in size. Mild aortic tortuosity. Previous subpleural opacities prior chest CT have resolved. No acute airspace disease. Multiple skin folds project over the left chest. No pulmonary edema. No pleural effusion. No acute osseous abnormalities are seen. IMPRESSION: 1. No acute abnormality. 2. Multiple skin folds project over the left chest. Electronically Signed   By: Narda Rutherford M.D.   On: 02/15/2020 01:49    EKG: Independently reviewed.  Assessment/Plan Principal Problem:   DKA (diabetic ketoacidosis) (HCC) Active Problems:   Liver cirrhosis secondary to NASH (HCC)    1. DKA - 1. Likely due to non-compliance with home meds 2. DKA pathway 3. Insulin gtt per pathway 4. BMP Q4H 5. Serial BHBs 6. IVF bolus and cont per pathway 1. Got 1L NS bolus thus far between EMS and ED, will put in another 1L LR bolus now as pt still very dehydrated. 2. Cirrhosis - 1. Chronic and stable 3. Pyuria on UA - 1. No dysuria, no WBC, no fever. 2. Will hold off on ABx for the moment 3. check urine culture.  DVT prophylaxis: Lovenox Code Status: Full Family Communication: No family in room Disposition Plan: Home after DKA resolved Consults called: None Admission status: Admit to  inpatient  Severity of Illness: The appropriate patient status for this patient is INPATIENT. Inpatient status is judged to be reasonable and necessary in order to provide the required intensity of service to ensure the patient's safety. The patient's presenting symptoms, physical exam findings, and initial radiographic and laboratory data in the context of their chronic comorbidities is felt to place them at high risk for further clinical deterioration. Furthermore, it is not anticipated that the patient will be medically stable for discharge from the hospital within 2 midnights  of admission. The following factors support the patient status of inpatient.   IP status for treatment of DKA.   * I certify that at the point of admission it is my clinical judgment that the patient will require inpatient hospital care spanning beyond 2 midnights from the point of admission due to high intensity of service, high risk for further deterioration and high frequency of surveillance required.*    Dwayne Begay M. DO [facility]  How to contact the [facility] Attending or Consulting provider 7A - 7P or covering provider during after hours 7P -7A, for this patient?  1. Check the care team in [facility] and look for a) attending/consulting [facility] provider listed and b) the [facility] team listed 2. Log into [email or web address]  Amion Physician Scheduling and messaging for groups and whole hospitals  On call and physician scheduling software for group practices, residents, hospitalists and other medical providers for call, clinic, rotation and shift schedules. On[employer] is a hospital-wide system for scheduling doctors and paging doctors on call. EasyPlot is for scientific plotting and data analysis.  [email or web address]  and use [facility]'s universal password to access. If you do not have the password, please contact the hospital operator.  3. Locate the [facility] provider you are looking for under [facility] and page to a  number that you can be directly reached. 4. If you still have difficulty reaching the provider, please page the [facility] (Director on Call) for the Hospitalists listed on amion for assistance.  02/15/2020, 4:52 AM

## 2020-02-15 NOTE — ED Notes (Signed)
433 blood sugar. Wayne Benson, NT

## 2020-02-15 NOTE — Evaluation (Signed)
Physical Therapy Evaluation Patient Details Name: Wayne Benson MRN: [id number] DOB: 25-Aug-1955 Today's Date: 02/15/2020   History of Present Illness  Wayne Benson is a 64 y.o. male with PMH significant for DM2, NASH cirrhosis, GERD, DVT, bipolar disorder, schizophrenia. Patient admitted for DKA and admits to 3-4 days of non-compliance with medications. Pt presents wtih weakness/fatigue, and c/o progressively worsening symptoms over last ~6 days.     Clinical Impression  Wayne Benson is 64 y.o. male admitted with above HPI and diagnosis. Patient is currently limited by functional impairments below (see PT problem list). Patient lives with his wife and reports difficulty walking at baseline and reliant on furniture/walls of home to balance. He reports he was planning to go to [facility] and obtain a RW from their office in December. Patient will benefit from continued skilled PT interventions to address impairments and progress independence with mobility, recommending HHPT to address impairments and improve balance to reduce fall risk. patient will also need RW for mobility. Patient has 7-8 stairs to enter home and will need to be able to ascend steps safely to discharge home. Acute PT will follow and progress as able.     Follow Up Recommendations Home health PT;Supervision - Intermittent    Equipment Recommendations  Rolling walker with 5" wheels;3in1 (PT)    Recommendations for Other Services       Precautions / Restrictions Precautions Precautions: Fall Precaution Comments: "oh lord a whole lot" Restrictions Weight Bearing Restrictions: No      Mobility  Bed Mobility Overal bed mobility: Needs Assistance Bed Mobility: Supine to Sit     Supine to sit: Min guard;HOB elevated     General bed mobility comments: pt using bed rail and taking increased time to sit to EOB. no assist needed.    Transfers Overall transfer level: Needs assistance Equipment used: Rolling walker (2  wheeled) Transfers: Sit to/from Stand Sit to Stand: Min guard;Min assist         General transfer comment: cues for safe hand placement, no assist needed for power up, min assist to steady in standing initially.   Ambulation/Gait Ambulation/Gait assistance: Min assist;Min guard Gait Distance (Feet): 40 Feet (in room to bathroom and back to recliner) Assistive device: Rolling walker (2 wheeled) Gait Pattern/deviations: Step-through pattern;Decreased step length - right;Decreased step length - left;Decreased stride length;Trunk flexed Gait velocity: decr   General Gait Details: pt required cues for safe hand placement on RW, pt with tendency to hold RW at front and be too far forward. min assist to steady and prevent LOB during turns. HR reached max of 115 bpm with mobility.   Stairs            Wheelchair Mobility    Modified Rankin (Stroke Patients Only)       Balance Overall balance assessment: Needs assistance Sitting-balance support: Feet supported;No upper extremity supported Sitting balance-Leahy Scale: Good     Standing balance support: Single extremity supported;During functional activity;Bilateral upper extremity supported Standing balance-Leahy Scale: Fair Standing balance comment: pt reliant on single UE support on table/bed rail while standing to complete peri-care with other UE. Bil UE support on walker needed for gait.                             Pertinent Vitals/Pain Pain Assessment: 0-10 Pain Score: 7  Pain Location: legs hurt from neuropathy Pain Descriptors / Indicators: Burning    Home Living Family/patient  expects to be discharged to:: Private residence Living Arrangements: Spouse/significant other Available Help at Discharge: Family Type of Home: House Home Access: Stairs to enter Entrance Stairs-Rails: Right;Left;Can reach both Entrance Stairs-Number of Steps: 7-8 Home Layout: One level Home Equipment: None      Prior  Function Level of Independence: Needs assistance;Independent   Gait / Transfers Assistance Needed: pt reports he furniture surfs and uses the walls to get around. he reports a lot of falls.  ADL's / Homemaking Assistance Needed: Pt reports his wife helps with cooking meals but is limited by her fibromyalgia. he reports he gets food from [employer] representative. he is independent with bathing and dressing aside from his shoes and socks which his wife helps with.   Comments: pt still drives and reports ~3-4 wks ago he was walking in a blackberry patch which is how his legs got scratched up.     Hand Dominance   Dominant Hand: Right    Extremity/Trunk Assessment   Upper Extremity Assessment Upper Extremity Assessment: Defer to OT evaluation    Lower Extremity Assessment Lower Extremity Assessment: Generalized weakness    Cervical / Trunk Assessment Cervical / Trunk Assessment: Normal  Communication   Communication: No difficulties  Cognition Arousal/Alertness: Awake/alert Behavior During Therapy: WFL for tasks assessed/performed Overall Cognitive Status: Within Functional Limits for tasks assessed                                        General Comments General comments (skin integrity, edema, etc.): small pink spot on Lt sacrum noted and RN notified.     Exercises     Assessment/Plan    PT Assessment Patient needs continued PT services  PT Problem List Decreased strength;Decreased range of motion;Decreased activity tolerance;Decreased balance;Decreased mobility;Decreased knowledge of use of DME;Decreased knowledge of precautions       PT Treatment Interventions DME instruction;Gait training;Stair training;Functional mobility training;Therapeutic activities;Therapeutic exercise;Balance training;Patient/family education    PT Goals (Current goals can be found in the Care Plan section)  Acute Rehab PT Goals Patient Stated Goal: regain strength PT Goal Formulation:  With patient Time For Goal Achievement: 02/29/20 Potential to Achieve Goals: Good    Frequency Min 3X/week   Barriers to discharge Inaccessible home environment pt has 7-8 stairs to ascend to enter his home    Co-evaluation               AM-PAC PT "6 Clicks" Mobility  Outcome Measure Help needed turning from your back to your side while in a flat bed without using bedrails?: A Little Help needed moving from lying on your back to sitting on the side of a flat bed without using bedrails?: A Little Help needed moving to and from a bed to a chair (including a wheelchair)?: A Little Help needed standing up from a chair using your arms (e.g., wheelchair or bedside chair)?: A Little Help needed to walk in hospital room?: A Little Help needed climbing 3-5 steps with a railing? : A Lot 6 Click Score: 17    End of Session Equipment Utilized During Treatment: Gait belt Activity Tolerance: Patient tolerated treatment well Patient left: in chair;with call bell/phone within reach;with chair alarm set Nurse Communication: Mobility status PT Visit Diagnosis: Muscle weakness (generalized) (M62.81);Difficulty in walking, not elsewhere classified (R26.2)    Time: 9024-0973 PT Time Calculation (min) (ACUTE ONLY): 36 min   Charges:  PT Evaluation $PT Eval Low Complexity: 1 Low PT Treatments $Gait Training: 8-22 mins      Wynn Maudlin, DPT Acute Rehabilitation Services  Office [phone] Pager [phone]  02/15/2020 6:55 PM

## 2020-02-15 NOTE — Progress Notes (Signed)
Attempted to get report but unable to get in touch with nurse.

## 2020-02-15 NOTE — ED Notes (Signed)
Date and time results received: 02/15/20 0448  Test:Lactic Acid Critical Value: 2.1 Name of Provider Notified :Devoria Albe MD Orders Received? Or Actions Taken?: no new orders

## 2020-02-16 LAB — BASIC METABOLIC PANEL
Anion gap: 7 (ref 5–15)
BUN: 33 mg/dL — ABNORMAL HIGH (ref 8–23)
CO2: 26 mmol/L (ref 22–32)
Calcium: 8.6 mg/dL — ABNORMAL LOW (ref 8.9–10.3)
Chloride: 100 mmol/L (ref 98–111)
Creatinine, Ser: 1 mg/dL (ref 0.61–1.24)
GFR, Estimated: >60 mL/min (ref >60)
Glucose, Bld: 328 mg/dL — ABNORMAL HIGH (ref 70–99)
Potassium: 3.7 mmol/L (ref 3.5–5.1)
Sodium: 133 mmol/L — ABNORMAL LOW (ref 135–145)

## 2020-02-16 LAB — GLUCOSE, CAPILLARY
Glucose-Capillary: 327 mg/dL — ABNORMAL HIGH (ref 70–99)
Glucose-Capillary: 289 mg/dL — ABNORMAL HIGH (ref 70–99)
Glucose-Capillary: 298 mg/dL — ABNORMAL HIGH (ref 70–99)
Glucose-Capillary: 303 mg/dL — ABNORMAL HIGH (ref 70–99)
Glucose-Capillary: 140 mg/dL — ABNORMAL HIGH (ref 70–99)

## 2020-02-16 LAB — HEMOGLOBIN A1C
Hgb A1c MFr Bld: 15 % — ABNORMAL HIGH (ref 4.8–5.6)
Mean Plasma Glucose: 384 mg/dL

## 2020-02-16 MED ORDER — SODIUM CHLORIDE 0.9 % IV SOLN
1.0000 g | INTRAVENOUS | Status: DC
  Administered 2020-02-16 – 2020-02-17 (×2): 1 g via INTRAVENOUS
  Filled 2020-02-16: qty 10
  Filled 2020-02-16: qty 1

## 2020-02-16 MED ORDER — SODIUM CHLORIDE 0.9 % IV SOLN: INTRAVENOUS | Status: AC

## 2020-02-16 MED ORDER — INSULIN GLARGINE 100 UNIT/ML ~~LOC~~ SOLN
50.0000 [IU] | Freq: Two times a day (BID) | SUBCUTANEOUS | Status: DC
  Administered 2020-02-16 – 2020-02-19 (×6): 50 [IU] via SUBCUTANEOUS
  Filled 2020-02-16 (×6): qty 0.5

## 2020-02-16 MED ORDER — ENSURE ENLIVE PO LIQD
237.0000 mL | Freq: Two times a day (BID) | ORAL | Status: DC
  Administered 2020-02-16 – 2020-02-17 (×2): 237 mL via ORAL

## 2020-02-16 MED ORDER — INSULIN ASPART 100 UNIT/ML ~~LOC~~ SOLN
10.0000 [IU] | Freq: Three times a day (TID) | SUBCUTANEOUS | Status: DC
  Administered 2020-02-16 – 2020-02-19 (×9): 10 [IU] via SUBCUTANEOUS

## 2020-02-16 NOTE — TOC Initial Note (Addendum)
Transition of Care [facility]) - Initial/Assessment Note    Patient Details  Name: Wayne Benson MRN: [id number] Date of Birth: 1955/08/15  [facility] CM/SW Contact:    Ida Rogue, LCSW Phone Number: 02/16/2020, 11:16 AM  Clinical Narrative:  Patient seen in follow up to PT recommendation of HH PT, DME.  Mr Kimball lives in [place] with his wife, is a Administrator, [employer] Service and requests that I go through the [facility] for all his needs.  His PCP is Dr Chilton Si, and he is open to both services and DME upon d/c.  Orders appreciated and FAXed to Dr Chilton Si for sign off and forwarding. Mr Drayden states both he and his wife are having difficulty with cooking, and he is interested in resources like [employer] for food delivery.  Will follow up. TOC will continue to follow during the course of hospitalization.  Addendum:  Got patient and his wife on waiting list for [employer]                 Expected Discharge Plan: Home w Home Health Services Barriers to Discharge: No Barriers Identified   Patient Goals and CMS Choice   CMS Medicare.gov Compare Post Acute Care list provided to:: Patient Choice offered to / list presented to : Patient  Expected Discharge Plan and Services Expected Discharge Plan: Home w Home Health Services   Discharge Planning Services: CM Consult Post Acute Care Choice: Home Health Living arrangements for the past 2 months: Single Family Home                         Representative spoke with at DME Agency: [facility] HH Arranged: PT, OT       Representative spoke with at [facility]  Prior Living Arrangements/Services Living arrangements for the past 2 months: Single Family Home Lives with:: Spouse Patient language and need for interpreter reviewed:: Yes Do you feel safe going back to the place where you live?: Yes      Need for Family Participation in Patient Care: Yes (Comment) Care giver support system in place?: Yes (comment)    Criminal Activity/Legal Involvement Pertinent to Current Situation/Hospitalization: No - Comment as needed  Activities of Daily Living Home Assistive Devices/Equipment: CBG Meter ADL Screening (condition at time of admission) Patient's cognitive ability adequate to safely complete daily activities?: Yes Is the patient deaf or have difficulty hearing?: No Does the patient have difficulty seeing, even when wearing glasses/contacts?: No Does the patient have difficulty concentrating, remembering, or making decisions?: No Patient able to express need for assistance with ADLs?: Yes Does the patient have difficulty dressing or bathing?: No Independently performs ADLs?: Yes (appropriate for developmental age) Does the patient have difficulty walking or climbing stairs?: Yes (secondary to increasing fatigue) Weakness of Legs: Both Weakness of Arms/Hands: None  Permission Sought/Granted                  Emotional Assessment Appearance:: Appears stated age Attitude/Demeanor/Rapport: Engaged Affect (typically observed): Appropriate   Alcohol / Substance Use: Not Applicable Psych Involvement: No (comment)  Admission diagnosis:  DKA (diabetic ketoacidosis) (HCC) [E11.10] Diabetic ketoacidosis without coma associated with type 2 diabetes mellitus (HCC) [E11.10] Patient Active Problem List   Diagnosis Date Noted  . DKA (diabetic ketoacidosis) (HCC) 02/15/2020  . Pyuria 02/15/2020  . Hypoglycemia 08/23/2016  . ILD (interstitial lung disease) (HCC)   . Hypoxemia   . Thrombocytopenia (HCC) 08/06/2016  .  Diabetes (HCC) 01/20/2016  . Esophageal varices in cirrhosis (HCC) 01/20/2016  . Tobacco use disorder 01/20/2016  . GERD (gastroesophageal reflux disease) 01/20/2016  . Liver cirrhosis secondary to NASH (HCC) 01/20/2016   PCP:  [facility] Pharmacy:   [facility] [phone] - [place], [place] - [address] [place]  [address] Phone: [phone] Fax: [phone]     Social Determinants of Health (SDOH) Interventions    Readmission Risk Interventions No flowsheet data found.

## 2020-02-16 NOTE — Plan of Care (Signed)

## 2020-02-16 NOTE — Progress Notes (Addendum)
Pt. Runs some PVC's and bigeminy's then converted back to NSR. Pt. Is no acute distress, no cardiac symptoms noted. MD was notified. No further order,  tele monitoring continues.  12 lead ECG done, MD notified of the reading. No further order.

## 2020-02-16 NOTE — Progress Notes (Signed)
Inpatient Diabetes Program Recommendations  AACE/ADA: New Consensus Statement on Inpatient Glycemic Control (2015)  Target Ranges:  Prepandial:   less than 140 mg/dL      Peak postprandial:   less than 180 mg/dL (1-2 hours)      Critically ill patients:  140 - 180 mg/dL   Lab Results  Component Value Date   GLUCAP 289 (H) 02/16/2020   HGBA1C 15.0 (H) 02/15/2020    Review of Glycemic Control Results for Wayne Benson, Wayne Benson (MRN [id number]) as of 02/16/2020 10:25  Ref. Range 02/15/2020 17:09 02/15/2020 20:30 02/15/2020 21:52 02/16/2020 04:33 02/16/2020 07:52  Glucose-Capillary Latest Ref Range: 70 - 99 mg/dL 741 (H) 287 (H) 867 (H) 327 (H) 289 (H)   Diabetes history: DM2 Outpatient Diabetes medications: Lantus 40 units bid Current orders for Inpatient glycemic control: Lantus 40 units bid + Novolog 5 units tid meal coverage + Novolog moderate correction tid  Inpatient Diabetes Program Recommendations:   -Increase Novolog meal coverage to 10 units tid if eats 50% meals -Increase Lantus to 50 units bid -Add Novolog hs correction 0-5 units Secure chat sent to Dr. Margo Aye  Thank you, Billy Fischer. Dreanna Kyllo, RN, MSN, CDE  Diabetes Coordinator Inpatient Glycemic Control Team Team Pager [phone] (8am-5pm) 02/16/2020 10:28 AM

## 2020-02-16 NOTE — Progress Notes (Addendum)
Updated the patient's wife via phone.  She expresses that her husband is depressed.  Patient confirmed this evening that he is depressed and has had suicidal ideation recently at home.  Psychiatry consulted to further assist.  We will implement safety precautions with 1-to-1 sitter for now until patient is evaluated by psychiatry.

## 2020-02-16 NOTE — Progress Notes (Signed)
PROGRESS NOTE  Wayne Benson TMB:[id number] DOB: 19-Jan-1956 DOA: 02/15/2020 PCP: [facility]  HPI/Recap of past 74 hours: 64 year old male with past medical history for type 2 diabetes mellitus, nonalcoholic steatohepatitis with cirrhosis.  Patient developed progressive fatigue for the last 6 days, he has been not taking his home insulin and his p.o. intake has been very poor.  Denied abdominal pain, nausea, vomiting or diarrhea.  On his initial physical examination blood pressure 101/43, heart rate 87, respiratory 20, oxygen saturation 98%, he had dry mucous membranes, lungs clear to auscultation bilaterally, heart S1-S2, present rhythmic, soft abdomen, no lower extremity edema. Sodium 132, potassium 4.2, chloride 93, bicarb 21, glucose 533, BUN 32, creatinine 1.17, white count 10.1, hemoglobin 12.9, hematocrit 37.4, platelets 105.  SARS COVID-19 negative.  Urinalysis more than 500 glucose, specific gravity 1.022, more than 50 white cells. Chest x-ray with hyperinflation, no infiltrates, faint scarring right upper lobe EKG 90 bpm, normal axis, normal intervals, sinus rhythm with positive PVCs, no ST segment or T wave changes.  02/16/20: Seen and examined at his bedside.  Mentation appears to be improving.  Started on Rocephin this morning, urine culture positive for greater than 100,000 colonies of staph aureus, susceptibilities to follow.  Assessment/Plan: Principal Problem:   DKA (diabetic ketoacidosis) (HCC) Active Problems:   Liver cirrhosis secondary to NASH (HCC)   Pyuria  Resolved DKA, type 2 diabetes DKA has resolved, off IV insulin Hemoglobin A1c 15.0 on 02/15/2020 CBGs not at goal in the 300s, increase insulin doses Currently on subcu insulin, Lantus 50 units twice daily, NovoLog 10 units 3 times daily, and insulin sliding scale. Continue to monitor CBGs  Type 2 diabetes, uncontrolled with hyperglycemia Management as stated above Diabetes coordinator following,  appreciate recommendations.  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      Code Status: Full code  Family Communication: None at bedside  Disposition Plan: Home with home health PT OT   Consultants:  Diabetes coordinator  Procedures:  None  Antimicrobials:  Rocephin, 02/16/2020>>  DVT prophylaxis: Subcu Lovenox daily  Status is: Inpatient    Dispo:  Patient From: Home  Planned Disposition: Home with Health Care Svc  Expected discharge date: 02/17/20  Medically stable for discharge: No, ongoing management of staph aureus UTI.         Objective: Vitals:   02/16/20 0035 02/16/20 0329 02/16/20 0400 02/16/20 0500  BP: 93/63 116/69    Pulse: 92 (!) 45  85  Resp: 18 19    Temp: 98.9 F (37.2 C) 98.4 F (36.9 C)    TempSrc:      SpO2: 96% 100%    Weight:   62 kg   Height:        Intake/Output Summary (Last 24 hours) at 02/16/2020 1332 Last data filed at 02/16/2020 1247 Gross per 24 hour  Intake 1422.56 ml  Output 950 ml  Net 472.56 ml   Filed Weights   02/16/20 0400  Weight: 62 kg    Exam:  . General: 64 y.o. year-old male well developed well nourished in no  acute distress.  Alert and oriented x3. . Cardiovascular: Regular rate and rhythm with no rubs or gallops.  No thyromegaly or JVD noted.   Marland Kitchen Respiratory: Clear to auscultation with no wheezes or rales. Good inspiratory effort. . Abdomen: Soft nontender nondistended with normal bowel sounds x4  quadrants. . Musculoskeletal: No lower extremity edema. 2/4 pulses in all 4 extremities. Marland Kitchen Psychiatry: Mood is appropriate for condition and setting   Data Reviewed: CBC: Recent Labs  Lab 02/15/20 0133  WBC 10.1  NEUTROABS 8.1*  HGB 12.9*  HCT 37.4*  MCV 86.8  PLT 105*   Basic Metabolic Panel: Recent Labs  Lab 02/15/20 0133 02/15/20 0537 02/15/20 0833 02/15/20 1307 02/16/20 0321  NA 132* 135 135 138 133*  K 4.2 4.2 3.6 3.7 3.7  CL 94* 100 101 103 100  CO2 21* 22 23 25 26   GLUCOSE 533* 375* 232* 191* 328*  BUN 32* 33* 31* 29* 33*  CREATININE 1.17 1.07 0.98 0.81 1.00  CALCIUM 9.2 8.7* 8.4* 8.7* 8.6*   GFR: Estimated Creatinine Clearance: 65.4 mL/min (by C-G formula based on SCr of 1 mg/dL). Liver Function Tests: Recent Labs  Lab 02/15/20 0133  AST 13*  ALT 15  ALKPHOS 116  BILITOT 2.0*  PROT 7.1  ALBUMIN 3.3*   No results for input(s): LIPASE, AMYLASE in the last 168 hours. No results for input(s): AMMONIA in the last 168 hours. Coagulation Profile: No results for input(s): INR, PROTIME in the last 168 hours. Cardiac Enzymes: No results for input(s): CKTOTAL, CKMB, CKMBINDEX, TROPONINI in the last 168 hours. BNP (last 3 results) No results for input(s): PROBNP in the last 8760 hours. HbA1C: Recent Labs    02/15/20 0537  HGBA1C 15.0*   CBG: Recent Labs  Lab 02/15/20 2030 02/15/20 2152 02/16/20 0433 02/16/20 0752 02/16/20 1125  GLUCAP 449* 458* 327* 289* 298*   Lipid Profile: No results for input(s): CHOL, HDL, LDLCALC, TRIG, CHOLHDL, LDLDIRECT in the last 72 hours. Thyroid Function Tests: No results for input(s): TSH, T4TOTAL, FREET4, T3FREE, THYROIDAB in the last 72 hours. Anemia Panel: No results for input(s): VITAMINB12, FOLATE, FERRITIN, TIBC, IRON, RETICCTPCT in the last 72 hours. Urine analysis:    Component Value Date/Time   COLORURINE YELLOW 02/15/2020 0354   APPEARANCEUR HAZY (A) 02/15/2020 0354   LABSPEC 1.022 02/15/2020 0354    PHURINE 7.0 02/15/2020 0354   GLUCOSEU >=500 (A) 02/15/2020 0354   HGBUR SMALL (A) 02/15/2020 0354   BILIRUBINUR NEGATIVE 02/15/2020 0354   KETONESUR 5 (A) 02/15/2020 0354   PROTEINUR NEGATIVE 02/15/2020 0354   NITRITE NEGATIVE 02/15/2020 0354   LEUKOCYTESUR MODERATE (A) 02/15/2020 0354   Sepsis Labs: @LABRCNTIP (procalcitonin:4,lacticidven:4)  ) Recent Results (from the past 240 hour(s))  Respiratory Panel by RT PCR (Flu A&B, Covid) - Nasopharyngeal Swab     Status: None   Collection Time: 02/15/20  1:34 AM   Specimen: Nasopharyngeal Swab  Result Value Ref Range Status   SARS Coronavirus 2 by RT PCR NEGATIVE NEGATIVE Final    Comment: (NOTE) SARS-CoV-2 target nucleic acids are NOT DETECTED.  The SARS-CoV-2 RNA is generally detectable in upper respiratoy specimens during the acute phase of infection. The lowest concentration of SARS-CoV-2 viral copies this assay can detect is 131 copies/mL. A negative result does not preclude SARS-Cov-2 infection and should not be used as the sole basis for treatment or other patient management decisions. A negative result may occur with  improper specimen collection/handling, submission of specimen other than nasopharyngeal swab, presence  of viral mutation(s) within the areas targeted by this assay, and inadequate number of viral copies (<131 copies/mL). A negative result must be combined with clinical observations, patient history, and epidemiological information. The expected result is Negative.  Fact Sheet for Patients:  https://www.moore.com/  Fact Sheet for Healthcare Providers:  https://www.young.biz/  This test is no t yet approved or cleared by the [place] FDA and  has been authorized for detection and/or diagnosis of SARS-CoV-2 by FDA under an Emergency Use Authorization (EUA). This EUA will remain  in effect (meaning this test can be used) for the duration of the COVID-19 declaration  under Section 564(b)(1) of the Act, 21 U.S.C. section 360bbb-3(b)(1), unless the authorization is terminated or revoked sooner.     Influenza A by PCR NEGATIVE NEGATIVE Final   Influenza B by PCR NEGATIVE NEGATIVE Final    Comment: (NOTE) The Xpert Xpress SARS-CoV-2/FLU/RSV assay is intended as an aid in  the diagnosis of influenza from Nasopharyngeal swab specimens and  should not be used as a sole basis for treatment. Nasal washings and  aspirates are unacceptable for Xpert Xpress SARS-CoV-2/FLU/RSV  testing.  Fact Sheet for Patients: https://www.moore.com/  Fact Sheet for Healthcare Providers: https://www.young.biz/  This test is not yet approved or cleared by the [place] FDA and  has been authorized for detection and/or diagnosis of SARS-CoV-2 by  FDA under an Emergency Use Authorization (EUA). This EUA will remain  in effect (meaning this test can be used) for the duration of the  Covid-19 declaration under Section 564(b)(1) of the Act, 21  U.S.C. section 360bbb-3(b)(1), unless the authorization is  terminated or revoked. Performed at [facility] [place], [address], [place], [place] [address]   Culture, Urine     Status: Abnormal (Preliminary result)   Collection Time: 02/15/20  5:37 AM   Specimen: Urine, Random  Result Value Ref Range Status   Specimen Description   Final    URINE, RANDOM Performed at [facility], [address] [place], [place] [address]    Special Requests   Final    NONE Performed at [facility], [address], [place], [place] [address]    Culture (A)  Final    >=100,000 COLONIES/mL STAPHYLOCOCCUS AUREUS SUSCEPTIBILITIES TO FOLLOW Performed at [facility], [address] [place], [place] [address]    Report Status PENDING  Incomplete      Studies: No results found.  Scheduled Meds: . enoxaparin (LOVENOX) injection  40 mg  Subcutaneous Q24H  . gabapentin  900 mg Oral QHS  . insulin aspart  0-15 Units Subcutaneous TID WC  . insulin aspart  10 Units Subcutaneous TID WC  . insulin glargine  50 Units Subcutaneous BID    Continuous Infusions: . sodium chloride 75 mL/hr at 02/16/20 0909  . cefTRIAXone (ROCEPHIN)  IV 1 g (02/16/20 0902)     LOS: 1 day     Darlin Drop, MD [facility] Pager [phone]  If 7PM-7AM, please contact night-coverage [email or web address] Password [email or web address] 02/16/2020, 1:32 PM

## 2020-02-16 NOTE — Progress Notes (Signed)
Inpatient Diabetes Program Recommendations  AACE/ADA: New Consensus Statement on Inpatient Glycemic Control (2015)  Target Ranges:  Prepandial:   less than 140 mg/dL      Peak postprandial:   less than 180 mg/dL (1-2 hours)      Critically ill patients:  140 - 180 mg/dL   Lab Results  Component Value Date   GLUCAP 298 (H) 02/16/2020   HGBA1C 15.0 (H) 02/15/2020    Review of Glycemic Control  Inpatient Diabetes Program Recommendations:   Spoke with pt @ bedside about A1C results with them 15 (average blood glucose 384 over the past 2-3 months) and explained what an A1C is, basic pathophysiology of DM Type 2, basic home care, basic diabetes diet nutrition principles, importance of checking CBGs and maintaining good CBG control to prevent long-term and short-term complications. Reviewed signs and symptoms of hyperglycemia and hypoglycemia and how to treat hypoglycemia at home. Also reviewed blood sugar goals at home.  RN Darrelyn Hillock has been providing ongoing basic DM education at bedside with this patient today.  Patient states his mother in law was just placed into hospice care and so much going on that he just hadn't been taking his insulin. Patient states willingness to set his clock alarm to remind him to administer his insulin.  Thank you, Billy Fischer. Trinh Sanjose, RN, MSN, CDE  Diabetes Coordinator Inpatient Glycemic Control Team Team Pager [phone] (8am-5pm) 02/16/2020 3:17 PM

## 2020-02-16 NOTE — Progress Notes (Signed)
Physical Therapy Treatment Patient Details Name: Wayne Benson MRN: [id number] DOB: [birth year] Today's Date: 02/16/2020    History of Present Illness Wayne Benson is a 64 y.o. male with PMH significant for DM2, NASH cirrhosis, GERD, DVT, bipolar disorder, schizophrenia. Patient admitted for DKA and admits to 3-4 days of non-compliance with medications. Pt presents wtih weakness/fatigue, and c/o progressively worsening symptoms over last ~6 days.     PT Comments    Patient making good progress with therapy and ambulated increased distance of ~58' with RW and min assist. He requires frequent cues for safe proximity to RW and management of walker during turns. Pt instructed on functional LE strengthening and completed sit<>stands with cues for safe hand placement/technique. He will continue to benefit from skilled PT interventions to progress safety and independence with mobility.     Follow Up Recommendations  Home health PT;Supervision - Intermittent     Equipment Recommendations  Rolling walker with 5" wheels;3in1 (PT)    Recommendations for Other Services       Precautions / Restrictions Precautions Precautions: Fall Restrictions Weight Bearing Restrictions: No    Mobility  Bed Mobility Overal bed mobility: Needs Assistance Bed Mobility: Supine to Sit     Supine to sit: HOB elevated;Supervision     General bed mobility comments: no assist required pt using bed rail to sit up.   Transfers Overall transfer level: Needs assistance Equipment used: Rolling walker (2 wheeled) Transfers: Sit to/from Stand Sit to Stand: Min guard         General transfer comment: pt requires cues for hand placement on RW. pt using single UE for power up and close min guard for safety.  Ambulation/Gait Ambulation/Gait assistance: Min assist;Min guard Gait Distance (Feet): 80 Feet Assistive device: Rolling walker (2 wheeled) Gait Pattern/deviations: Step-through pattern;Decreased step  length - right;Decreased step length - left;Decreased stride length;Trunk flexed Gait velocity: decr   General Gait Details: pt requires repeated cues for safe proximity to RW. Min assist throughout to staedy and manage walker position. Assist required to manage/sequence turns. HR max of 113 bpm with gait.   Stairs             Wheelchair Mobility    Modified Rankin (Stroke Patients Only)       Balance Overall balance assessment: Needs assistance Sitting-balance support: Feet supported;No upper extremity supported Sitting balance-Leahy Scale: Good     Standing balance support: Single extremity supported;During functional activity;Bilateral upper extremity supported Standing balance-Leahy Scale: Fair                              Cognition Arousal/Alertness: Awake/alert Behavior During Therapy: WFL for tasks assessed/performed Overall Cognitive Status: Within Functional Limits for tasks assessed                                        Exercises Other Exercises Other Exercises: 10 reps Sit<>Stands from recliner for strengthening. Cues throughout for sequencing hand placement for power up and safe reach back to recliner.    General Comments General comments (skin integrity, edema, etc.): pt with loose stool during gait and returned to room to complete pericare, pt able to do so independently with min guard assist and single UE support for balance.      Pertinent Vitals/Pain Pain Assessment: No/denies pain    Home Living Family/patient  expects to be discharged to:: Private residence Living Arrangements: Spouse/significant other Available Help at Discharge: Family Type of Home: House Home Access: Stairs to enter Entrance Stairs-Rails: Right;Left;Can reach both Home Layout: One level Home Equipment: None      Prior Function Level of Independence: Needs assistance;Independent  Gait / Transfers Assistance Needed: pt reports he furniture  surfs and uses the walls to get around. he reports a lot of falls. ADL's / Homemaking Assistance Needed: Pt reports his wife helps with cooking meals but is limited by her fibromyalgia. he reports he gets food from [employer] representative. he is independent with bathing and dressing aside from his shoes and socks which his wife helps with.  Comments: pt still drives and reports ~3-4 wks ago he was walking in a blackberry patch which is how his legs got scratched up.   PT Goals (current goals can now be found in the care plan section) Acute Rehab PT Goals Patient Stated Goal: regain strength PT Goal Formulation: With patient Time For Goal Achievement: 02/29/20 Potential to Achieve Goals: Good Progress towards PT goals: Progressing toward goals    Frequency    Min 3X/week      PT Plan Current plan remains appropriate    Co-evaluation              AM-PAC PT "6 Clicks" Mobility   Outcome Measure  Help needed turning from your back to your side while in a flat bed without using bedrails?: None Help needed moving from lying on your back to sitting on the side of a flat bed without using bedrails?: A Little Help needed moving to and from a bed to a chair (including a wheelchair)?: A Little Help needed standing up from a chair using your arms (e.g., wheelchair or bedside chair)?: A Little Help needed to walk in hospital room?: A Little Help needed climbing 3-5 steps with a railing? : A Lot 6 Click Score: 18    End of Session Equipment Utilized During Treatment: Gait belt Activity Tolerance: Patient tolerated treatment well Patient left: in chair;with call bell/phone within reach;with chair alarm set Nurse Communication: Mobility status PT Visit Diagnosis: Muscle weakness (generalized) (M62.81);Difficulty in walking, not elsewhere classified (R26.2)     Time: 1737-1800 PT Time Calculation (min) (ACUTE ONLY): 23 min  Charges:  $Gait Training: 8-22 mins $Therapeutic Exercise: 8-22  mins                     Wynn Maudlin, DPT Acute Rehabilitation Services  Office [phone] Pager [phone]  02/16/2020 6:17 PM

## 2020-02-16 NOTE — Evaluation (Addendum)
Occupational Therapy Evaluation Patient Details Name: Wayne Benson MRN: [id number] DOB: 1956/02/03 Today's Date: 02/16/2020    History of Present Illness Wayne Benson is a 64 y.o. male with PMH significant for DM2, NASH cirrhosis, GERD, DVT, bipolar disorder, schizophrenia. Patient admitted for DKA and admits to 3-4 days of non-compliance with medications. Pt presents wtih weakness/fatigue, and c/o progressively worsening symptoms over last ~6 days.    Clinical Impression   [name] is a 64 year old man who presents today supine in bed. Patient demonstrates normal ROM and strength of upper extremities, impaired balance and decreased ROM of RLE resulting in difficulty with LB dressing. Patient able to ambulate in room with RW and min guard from therapist with verbal cues for safety and technique. Patient moves quickly and mildly impulsive. Patient will benefit from skilled OT services while in hospital in order to improve deficits and learn compensatory strategies as needed in order to return to PLOF.      Follow Up Recommendations  No OT follow up    Equipment Recommendations  Needs sock aide and long handled reacher (ADL kit)    Recommendations for Other Services       Precautions / Restrictions Precautions Precautions: Fall Restrictions Weight Bearing Restrictions: No      Mobility Bed Mobility Overal bed mobility: Independent             General bed mobility comments: independent for bed mobilty,    Transfers Overall transfer level: Needs assistance Equipment used: Rolling walker (2 wheeled) Transfers: Sit to/from Stand Sit to Stand: Min guard         General transfer comment: min guard to stand and ambulate in room with RW. verbal cues for technque and safety.    Balance Overall balance assessment: Mild deficits observed, not formally tested                                         ADL either performed or assessed with clinical  judgement   ADL Overall ADL's : Needs assistance/impaired Eating/Feeding: Independent   Grooming: Standing;Oral care;Wash/dry face;Wash/dry hands;Min guard;Supervision/safety   Upper Body Bathing: Set up;Sitting   Lower Body Bathing: Set up;Min guard;Sit to/from stand   Upper Body Dressing : Set up;Sitting   Lower Body Dressing: Moderate assistance Lower Body Dressing Details (indicate cue type and reason): Needs assistance to don right sock and thread right leg through underwear. Toilet Transfer: Min guard;Ambulation;RW;Regular Toilet;Grab bars   Toileting- Clothing Manipulation and Hygiene: Min guard       Functional mobility during ADLs: Min guard;Rolling walker       Vision Patient Visual Report: No change from baseline       Perception     Praxis      Pertinent Vitals/Pain Pain Assessment: No/denies pain     Hand Dominance Right   Extremity/Trunk Assessment Upper Extremity Assessment Upper Extremity Assessment: Overall WFL for tasks assessed   Lower Extremity Assessment Lower Extremity Assessment: Defer to PT evaluation   Cervical / Trunk Assessment Cervical / Trunk Assessment: Normal   Communication Communication Communication: No difficulties   Cognition Arousal/Alertness: Awake/alert Behavior During Therapy: WFL for tasks assessed/performed Overall Cognitive Status: Within Functional Limits for tasks assessed  General Comments       Exercises     Shoulder Instructions      Home Living Family/patient expects to be discharged to:: Private residence Living Arrangements: Spouse/significant other Available Help at Discharge: Family Type of Home: House Home Access: Stairs to enter [employer] of Steps: 7-8 Entrance Stairs-Rails: Right;Left;Can reach both Home Layout: One level     Bathroom Shower/Tub: Teacher, early years/pre: Standard Bathroom Accessibility: Yes    Home Equipment: None          Prior Functioning/Environment Level of Independence: Needs assistance;Independent  Gait / Transfers Assistance Needed: pt reports he furniture surfs and uses the walls to get around. he reports a lot of falls. ADL's / Homemaking Assistance Needed: Pt reports his wife helps with cooking meals but is limited by her fibromyalgia. he reports he gets food from Solic[employer]. he is independent with bathing and dressing aside from his shoes and socks which his wife helps with.    Comments: pt still drives and reports ~2-0 wks ago he was walking in a blackberry patch which is how his legs got scratched up.        OT Problem List: Impaired balance (sitting and/or standing);Pain;Decreased knowledge of use of DME or AE;Decreased range of motion      OT Treatment/Interventions: Self-care/ADL training;Therapeutic exercise;DME and/or AE instruction;Patient/family education;Therapeutic activities;Balance training    OT Goals(Current goals can be found in the care plan section) Acute Rehab OT Goals Patient Stated Goal: be independent OT Goal Formulation: With patient Time For Goal Achievement: 03/01/20 Potential to Achieve Goals: Good  OT Frequency: Min 2X/week   Barriers to D/C:            Co-evaluation              AM-PAC OT "6 Clicks" Daily Activity     Outcome Measure Help from another person eating meals?: None Help from another person taking care of personal grooming?: A Little Help from another person toileting, which includes using toliet, bedpan, or urinal?: A Little Help from another person bathing (including washing, rinsing, drying)?: A Little Help from another person to put on and taking off regular upper body clothing?: A Little Help from another person to put on and taking off regular lower body clothing?: A Lot 6 Click Score: 18   End of Session Equipment Utilized During Treatment: Surveyor, mining Communication:  (okay to see per  Rn)  Activity Tolerance: Patient tolerated treatment well Patient left: in bed;with call bell/phone within reach;with nursing/sitter in room  OT Visit Diagnosis: Unsteadiness on feet (R26.81)                Time: 2334-3568 OT Time Calculation (min): 16 min Charges:  OT General Charges $OT Visit: 1 Visit OT Evaluation $OT Eval Low Complexity: 1 Low  Gwen Edler, OTR/L [place]  Office [phone] Pager: [phone]   Lenward Chancellor 02/16/2020, 4:23 PM

## 2020-02-17 LAB — TSH: TSH: 0.949 u[IU]/mL (ref 0.350–4.500)

## 2020-02-17 LAB — BASIC METABOLIC PANEL
Anion gap: 9 (ref 5–15)
BUN: 26 mg/dL — ABNORMAL HIGH (ref 8–23)
CO2: 23 mmol/L (ref 22–32)
Calcium: 8.5 mg/dL — ABNORMAL LOW (ref 8.9–10.3)
Chloride: 104 mmol/L (ref 98–111)
Creatinine, Ser: 1.13 mg/dL (ref 0.61–1.24)
GFR, Estimated: >60 mL/min (ref >60)
Glucose, Bld: 137 mg/dL — ABNORMAL HIGH (ref 70–99)
Potassium: 3.8 mmol/L (ref 3.5–5.1)
Sodium: 136 mmol/L (ref 135–145)

## 2020-02-17 LAB — CBC WITH DIFFERENTIAL/PLATELET
Abs Immature Granulocytes: 0.02 10*3/uL (ref 0.00–0.07)
Basophils Absolute: 0 10*3/uL (ref 0.0–0.1)
Basophils Relative: 0 %
Eosinophils Absolute: 0 10*3/uL (ref 0.0–0.5)
Eosinophils Relative: 1 %
HCT: 31.5 % — ABNORMAL LOW (ref 39.0–52.0)
Hemoglobin: 10.8 g/dL — ABNORMAL LOW (ref 13.0–17.0)
Immature Granulocytes: 0 %
Lymphocytes Relative: 25 %
Lymphs Abs: 1.4 10*3/uL (ref 0.7–4.0)
MCH: 30.1 pg (ref 26.0–34.0)
MCHC: 34.3 g/dL (ref 30.0–36.0)
MCV: 87.7 fL (ref 80.0–100.0)
Monocytes Absolute: 0.6 10*3/uL (ref 0.1–1.0)
Monocytes Relative: 10 %
Neutro Abs: 3.7 10*3/uL (ref 1.7–7.7)
Neutrophils Relative %: 64 %
Platelets: 76 10*3/uL — ABNORMAL LOW (ref 150–400)
RBC: 3.59 MIL/uL — ABNORMAL LOW (ref 4.22–5.81)
RDW: 13.1 % (ref 11.5–15.5)
WBC: 5.7 10*3/uL (ref 4.0–10.5)
nRBC: 0 % (ref 0.0–0.2)

## 2020-02-17 LAB — LIPID PANEL
Cholesterol: 128 mg/dL (ref 0–200)
HDL: 27 mg/dL — ABNORMAL LOW (ref >40)
LDL Cholesterol: 88 mg/dL (ref 0–99)
Total CHOL/HDL Ratio: 4.7 RATIO
Triglycerides: 67 mg/dL (ref <150)
VLDL: 13 mg/dL (ref 0–40)

## 2020-02-17 LAB — GLUCOSE, CAPILLARY
Glucose-Capillary: 144 mg/dL — ABNORMAL HIGH (ref 70–99)
Glucose-Capillary: 263 mg/dL — ABNORMAL HIGH (ref 70–99)
Glucose-Capillary: 131 mg/dL — ABNORMAL HIGH (ref 70–99)
Glucose-Capillary: 203 mg/dL — ABNORMAL HIGH (ref 70–99)

## 2020-02-17 LAB — HEMOGLOBIN A1C
Hgb A1c MFr Bld: 15.1 % — ABNORMAL HIGH (ref 4.8–5.6)
Mean Plasma Glucose: 387 mg/dL

## 2020-02-17 MED ORDER — SULFAMETHOXAZOLE-TRIMETHOPRIM 800-160 MG PO TABS
1.0000 | ORAL_TABLET | Freq: Two times a day (BID) | ORAL | Status: DC
  Administered 2020-02-17 – 2020-02-19 (×5): 1 via ORAL
  Filled 2020-02-17 (×5): qty 1

## 2020-02-17 MED ORDER — GLUCERNA SHAKE PO LIQD
237.0000 mL | Freq: Three times a day (TID) | ORAL | Status: DC
  Administered 2020-02-17 – 2020-02-19 (×5): 237 mL via ORAL
  Filled 2020-02-17 (×8): qty 237

## 2020-02-17 MED ORDER — ONDANSETRON HCL 4 MG/2ML IJ SOLN
4.0000 mg | Freq: Three times a day (TID) | INTRAMUSCULAR | Status: DC | PRN
  Administered 2020-02-17: 4 mg via INTRAVENOUS
  Filled 2020-02-17: qty 2

## 2020-02-17 MED ORDER — SODIUM CHLORIDE 0.9 % IV SOLN: INTRAVENOUS | Status: AC

## 2020-02-17 MED ORDER — HYDROXYZINE HCL 10 MG PO TABS
10.0000 mg | ORAL_TABLET | Freq: Three times a day (TID) | ORAL | Status: DC | PRN
  Administered 2020-02-18: 10 mg via ORAL
  Filled 2020-02-17: qty 1

## 2020-02-17 MED ORDER — FLUOXETINE HCL 10 MG PO CAPS
10.0000 mg | ORAL_CAPSULE | Freq: Every day | ORAL | Status: DC
  Administered 2020-02-17 – 2020-02-19 (×3): 10 mg via ORAL
  Filled 2020-02-17 (×4): qty 1

## 2020-02-17 NOTE — TOC Progression Note (Addendum)
Transition of Care [facility]) - Progression Note    Patient Details  Name: Wayne Benson MRN: [id number] Date of Birth: September 23, 1955  Transition of Care [facility] CM/SW Contact  Ida Rogue, [place] Phone Number: 02/17/2020, 2:06 PM  Clinical Narrative: Burnett Harry with [facility] is willing to service patient for [facility] needs.  Received call from [facility] at [phone] who wanted to know [facility] agency.  Gave her Cindie's contact information. Also gave patient update about [facility] we are with [facility] and services, including ordering of DME, and Mobile Meals. He requested Xanax prescription from me.  I told him he would need to talk to MD about that.  Also admitted to depression, stated he thought he could benefit from seeing a therapist at the [place] though he also admitted to seeing someone once in the past but not following through with subsequent appointments.  TOC will continue to follow during the course of hospitalization.  Addendum I: Patient was seen by psychiatry and they are recommending inpatient psych placement when medically stable.  Dr Margo Aye confirms patient is medically stable.  No beds at [facility] nor at [facility].  Waiting to hear back from [facility] about bed availability.  Addendum II:  [facility] says no beds today. Will call about beds again tomorrow.      Expected Discharge Plan: Home w Home Health Services Barriers to Discharge: No Barriers Identified  Expected Discharge Plan and Services Expected Discharge Plan: Home w Home Health Services   Discharge Planning Services: CM Consult Post Acute Care Choice: Home Health Living arrangements for the past 2 months: Single Family Home                         Representative spoke with at D[facility] Agency: [facility] Arranged: PT, OT       Representative spoke with at [facility]   Social Determinants of Health (SDOH) Interventions    Readmission Risk Interventions No flowsheet data found.

## 2020-02-17 NOTE — Consult Note (Signed)
[facility] Face-to-Face Psychiatry Consult   Reason for Consult:  Depression Referring Physician:  Dr. Dow Adolph Patient Identification: Wayne Benson MRN:  [id number] Principal Diagnosis: DKA (diabetic ketoacidosis) (HCC) Diagnosis:  Principal Problem:   DKA (diabetic ketoacidosis) (HCC) Active Problems:   Liver cirrhosis secondary to NASH (HCC)   Pyuria   Total Time spent with patient: 30 minutes  Subjective:   Wayne Benson is a 64 y.o. male patient admitted with diabetic ketoacidosis.  He also reports an extensive medical history cirrhosis, uncontrolled diabetes, diabetic polyneuropathy, who presents to the emergency room with progressive fatigue.  Psychiatric consult was placed due to patient reports of depression.  On evaluation patient does identify symptoms of depression, noting he has been depressed for quite some time dating back to several years.  His depressive symptoms include worthlessness, hopeless, guilty, sadness, decrease in appetite, anhedonia, weight loss, recurrent thoughts of death, and suicidal thoughts with no plan.  He reports a psychiatric history of PTSD, anxiety, and depression.  He states he is currently seeking disability and has a lawsuit for his current claim of PTSD.  He reports several inpatient hospitalizations for problems listed above to include depression, PTSD, and anxiety.  His last admission was this year at [facility], he reports 1 week length of stay.  These results and or medical records are not available at this time for confirmation.  Patient requests being prescribed Xanax while in the hospital. "  My wife gave me a blue Xanax 1 time before and it works so well.  I would take 1 on a Thursday and wake up on a Saturday feeling so rested and peaceful.  I even stayed at the [facility] for 1 week to try to get them to prescribe the medication for me but they would not."  He continues to endorse ongoing passive suicidal thoughts, he is able to contract  for safety while in the hospital.  HPI:  64 year old male with past medical history for type 2 diabetes mellitus, nonalcoholic steatohepatitis with cirrhosis. Patient developed progressive fatigue for the last 6 days, hehas been not taking his homeinsulin and his p.o. intake has been very poor. Denied abdominal pain, nausea, vomiting or diarrhea. On his initial physical examination blood pressure 101/43, heart rate 87, respiratory 20, oxygen saturation 98%, he had dry mucous membranes, lungs clear to auscultation bilaterally, heart S1-S2, present rhythmic, soft abdomen, no lower extremity edema. Sodium 132, potassium 4.2, chloride 93, bicarb 21, glucose 533, BUN 32, creatinine 1.17, white count 10.1, hemoglobin 12.9, hematocrit 37.4, platelets 105. SARS COVID-19 negative. Urinalysis more than 500 glucose, specific gravity 1.022, more than 50 white cells. Chest x-ray with hyperinflation, no infiltrates, faint scarring right upper lobe EKG 90 bpm, normal axis, normal intervals, sinus rhythm with positive PVCs, no ST segment or T wave changes.  Past Psychiatric History: See above. Denies any previous suicide attempts.   Risk to Self:   Yes Risk to Others:  NO Prior Inpatient Therapy:  Multiple inpatient admissions Prior Outpatient Therapy:   None at this time. He admits to being noncompliant with most medications. "Im going to be honest with you . I was prescribed depression medication but I do not take it. "   Past Medical History:  Past Medical History:  Diagnosis Date  . Anemia   . Anxiety   . Bipolar disorder (HCC)   . Bronchial asthma    "get it just about q yr" (08/06/2016)  . Depression   . Diabetes  mellitus without complication (HCC)   . DVT (deep venous thrombosis) (HCC)   . Esophageal varices (HCC)   . GERD (gastroesophageal reflux disease)   . History of blood transfusion    "related to cirrhosis"  . Hypercholesteremia   . Liver cirrhosis secondary to NASH (HCC)     hx/notes 08/06/2016  . Non-alcoholic cirrhosis (HCC)   . Peripheral neuropathy   . Schizophrenia (HCC)   . Thrombocytopenia (HCC)     Past Surgical History:  Procedure Laterality Date  . CARDIAC CATHETERIZATION     "@ the [place]":  Marland Kitchen CATARACT EXTRACTION W/ INTRAOCULAR LENS IMPLANT Right   . ESOPHAGOGASTRODUODENOSCOPY (EGD) WITH PROPOFOL Left 01/21/2016   Procedure: ESOPHAGOGASTRODUODENOSCOPY (EGD) WITH PROPOFOL;  Surgeon: Willis Modena, MD;  Location: [facility] ENDOSCOPY;  Service: Endoscopy;  Laterality: Left;  . FINGER SURGERY Right 1990s?   "thumb; sliced it w/a blade"  . IVC FILTER PLACEMENT ([facility] HX)    . US GUIDED PARACENTESIS ([facility] HX)     Family History:  Family History  Problem Relation Age of Onset  . Congestive Heart Failure Mother   . Neuropathy Mother   . Heart disease Sister   . Diabetes Brother   . Diabetes Brother   . Heart disease Brother   . Neuropathy Brother   . Lung disease Neg Hx   . Rheumatologic disease Neg Hx    Family Psychiatric  History:Denies Social History:  Social History   Substance and Sexual Activity  Alcohol Use No     Social History   Substance and Sexual Activity  Drug Use No    Social History   Socioeconomic History  . Marital status: Married    Spouse name: Not on file  . Number of children: Not on file  . Years of education: Not on file  . Highest education level: Not on file  Occupational History  . Not on file  Tobacco Use  . Smoking status: Current Some Day Smoker    Packs/day: 0.25    Years: 11.00    Pack years: 2.75    Types: Cigarettes, Cigars    Start date: 06/11/2005  . Smokeless tobacco: Never Used  . Tobacco comment: Still smoking pipe tobacco cigars intermittently  Substance and Sexual Activity  . Alcohol use: No  . Drug use: No  . Sexual activity: Never  Other Topics Concern  . Not on file  Social History Narrative   [facility] (10/16/16):   Originally from [facility] and lived in all his life. 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 He was in [place] as well. Has a dog at home. No bird exposure. Does have mold in his home currently but reports it is a minimal amount in one of his bathrooms around the floor and bedroom behind the TV. No hot tub exposure. He has exposure to silica powder.    Social Determinants of Health   Financial Resource Strain:   . Difficulty of Paying Living Expenses: Not on file  Food Insecurity:   . Worried About Programme researcher, broadcasting/film/video in the Last Year: Not on file  . Ran Out of Food in the Last Year: Not on file  Transportation Needs:   . Lack of Transportation (Medical): Not on file  . Lack of Transportation (Non-Medical): Not on  file  Physical Activity:   . Days of Exercise per Week: Not on file  . Minutes of Exercise per Session: Not on file  Stress:   . Feeling of Stress : Not on file  Social Connections:   . Frequency of Communication with Friends and Family: Not on file  . Frequency of Social Gatherings with Friends and Family: Not on file  . Attends Religious Services: Not on file  . Active Member of Clubs or Organizations: Not on file  . Attends BankerClub or Organization Meetings: Not on file  . Marital Status: Not on file   Additional Social History:    Allergies:   Allergies  Allergen Reactions  . Duloxetine     Other reaction(s): Other (See Comments) Hallucinations Hallucinations   . Pregabalin     Other reaction(s): Other (See Comments) hallucinations hallucinations   . Trazodone Hives, Nausea Only and Nausea And Vomiting    Labs:  Results for orders placed or performed during the hospital encounter of 02/15/20 (from the past 48 hour(s))  CBG monitoring, ED     Status: Abnormal   Collection Time: 02/15/20 12:20 PM  Result  Value Ref Range   Glucose-Capillary 199 (H) 70 - 99 mg/dL    Comment: Glucose reference range applies only to samples taken after fasting for at least 8 hours.  Basic metabolic panel     Status: Abnormal   Collection Time: 02/15/20  1:07 PM  Result Value Ref Range   Sodium 138 135 - 145 mmol/L   Potassium 3.7 3.5 - 5.1 mmol/L   Chloride 103 98 - 111 mmol/L   CO2 25 22 - 32 mmol/L   Glucose, Bld 191 (H) 70 - 99 mg/dL    Comment: Glucose reference range applies only to samples taken after fasting for at least 8 hours.   BUN 29 (H) 8 - 23 mg/dL   Creatinine, Ser 4.090.81 0.61 - 1.24 mg/dL   Calcium 8.7 (L) 8.9 - 10.3 mg/dL   GFR, Estimated >81>60 >19>60 mL/min    Comment: (NOTE) Calculated using the CKD-EPI Creatinine Equation (2021)    Anion gap 10 5 - 15    Comment: Performed at [facility], [address] [place], [place] [address]  Beta-hydroxybutyric acid     Status: None   Collection Time: 02/15/20  1:07 PM  Result Value Ref Range   Beta-Hydroxybutyric Acid 0.24 0.05 - 0.27 mmol/L    Comment: Performed at [facility], [address] [place], [place] [address]  CBG monitoring, ED     Status: Abnormal   Collection Time: 02/15/20  1:14 PM  Result Value Ref Range   Glucose-Capillary 179 (H) 70 - 99 mg/dL    Comment: Glucose reference range applies only to samples taken after fasting for at least 8 hours.  CBG monitoring, ED     Status: Abnormal   Collection Time: 02/15/20  1:56 PM  Result Value Ref Range   Glucose-Capillary 227 (H) 70 - 99 mg/dL    Comment: Glucose reference range applies only to samples taken after fasting for at least 8 hours.  CBG monitoring, ED     Status: Abnormal   Collection Time: 02/15/20  4:00 PM  Result Value Ref Range   Glucose-Capillary 362 (H) 70 - 99 mg/dL    Comment: Glucose reference range applies only to samples taken after fasting for at least 8 hours.  Glucose, capillary     Status: Abnormal   Collection  Time: 02/15/20  5:09 PM  Result Value Ref Range   Glucose-Capillary 299 (H) 70 - 99 mg/dL    Comment: Glucose reference range applies only to samples taken after fasting for at least 8 hours.  Hemoglobin A1c     Status: Abnormal   Collection Time: 02/15/20  5:13 PM  Result Value Ref Range   Hgb A1c MFr Bld 15.1 (H) 4.8 - 5.6 %    Comment: (NOTE)         Prediabetes: 5.7 - 6.4         Diabetes: >6.4         Glycemic control for adults with diabetes: <7.0    Mean Plasma Glucose 387 mg/dL    Comment: (NOTE) Performed At: [facility] [address] [place], [place] [id number] Jolene Schimke MD OI:[id number]   Glucose, capillary     Status: Abnormal   Collection Time: 02/15/20  8:30 PM  Result Value Ref Range   Glucose-Capillary 449 (H) 70 - 99 mg/dL    Comment: Glucose reference range applies only to samples taken after fasting for at least 8 hours.  Glucose, capillary     Status: Abnormal   Collection Time: 02/15/20  9:52 PM  Result Value Ref Range   Glucose-Capillary 458 (H) 70 - 99 mg/dL    Comment: Glucose reference range applies only to samples taken after fasting for at least 8 hours.  Basic metabolic panel     Status: Abnormal   Collection Time: 02/16/20  3:21 AM  Result Value Ref Range   Sodium 133 (L) 135 - 145 mmol/L   Potassium 3.7 3.5 - 5.1 mmol/L   Chloride 100 98 - 111 mmol/L   CO2 26 22 - 32 mmol/L   Glucose, Bld 328 (H) 70 - 99 mg/dL    Comment: Glucose reference range applies only to samples taken after fasting for at least 8 hours.   BUN 33 (H) 8 - 23 mg/dL   Creatinine, Ser 3.38 0.61 - 1.24 mg/dL   Calcium 8.6 (L) 8.9 - 10.3 mg/dL   GFR, Estimated >25 >05 mL/min    Comment: (NOTE) Calculated using the CKD-EPI Creatinine Equation (2021)    Anion gap 7 5 - 15    Comment: Performed at [facility], [address] [place], [place] [address]  Glucose, capillary     Status: Abnormal   Collection Time: 02/16/20  4:33 AM  Result  Value Ref Range   Glucose-Capillary 327 (H) 70 - 99 mg/dL    Comment: Glucose reference range applies only to samples taken after fasting for at least 8 hours.  Glucose, capillary     Status: Abnormal   Collection Time: 02/16/20  7:52 AM  Result Value Ref Range   Glucose-Capillary 289 (H) 70 - 99 mg/dL    Comment: Glucose reference range applies only to samples taken after fasting for at least 8 hours.   Comment 1 Notify RN    Comment 2 Document in Chart   Glucose, capillary     Status: Abnormal   Collection Time: 02/16/20 11:25 AM  Result Value Ref Range   Glucose-Capillary 298 (H) 70 - 99 mg/dL    Comment: Glucose reference range applies only to samples taken after fasting for at least 8 hours.  Glucose, capillary     Status: Abnormal   Collection Time: 02/16/20  4:21 PM  Result Value Ref Range   Glucose-Capillary 303 (H) 70 - 99 mg/dL    Comment:  Glucose reference range applies only to samples taken after fasting for at least 8 hours.   Comment 1 Notify RN    Comment 2 Document in Chart   Glucose, capillary     Status: Abnormal   Collection Time: 02/16/20  9:05 PM  Result Value Ref Range   Glucose-Capillary 140 (H) 70 - 99 mg/dL    Comment: Glucose reference range applies only to samples taken after fasting for at least 8 hours.   Comment 1 Notify RN    Comment 2 Document in Chart   Basic metabolic panel     Status: Abnormal   Collection Time: 02/17/20  6:58 AM  Result Value Ref Range   Sodium 136 135 - 145 mmol/L   Potassium 3.8 3.5 - 5.1 mmol/L   Chloride 104 98 - 111 mmol/L   CO2 23 22 - 32 mmol/L   Glucose, Bld 137 (H) 70 - 99 mg/dL    Comment: Glucose reference range applies only to samples taken after fasting for at least 8 hours.   BUN 26 (H) 8 - 23 mg/dL   Creatinine, Ser 0.10 0.61 - 1.24 mg/dL   Calcium 8.5 (L) 8.9 - 10.3 mg/dL   GFR, Estimated >93 >23 mL/min    Comment: (NOTE) Calculated using the CKD-EPI Creatinine Equation (2021)    Anion gap 9 5 - 15     Comment: Performed at [facility], [address], [place], [place] [address]  CBC with Differential/Platelet     Status: Abnormal   Collection Time: 02/17/20  6:58 AM  Result Value Ref Range   WBC 5.7 4.0 - 10.5 K/uL   RBC 3.59 (L) 4.22 - 5.81 MIL/uL   Hemoglobin 10.8 (L) 13.0 - 17.0 g/dL   HCT 20.2 (L) 39 - 52 %   MCV 87.7 80.0 - 100.0 fL   MCH 30.1 26.0 - 34.0 pg   MCHC 34.3 30.0 - 36.0 g/dL   RDW 54.2 70.6 - 23.7 %   Platelets 76 (L) 150 - 400 K/uL    Comment: Immature Platelet Fraction may be clinically indicated, consider ordering this additional test [id number] CONSISTENT WITH PREVIOUS RESULT    nRBC 0.0 0.0 - 0.2 %   Neutrophils Relative % 64 %   Neutro Abs 3.7 1.7 - 7.7 K/uL   Lymphocytes Relative 25 %   Lymphs Abs 1.4 0.7 - 4.0 K/uL   Monocytes Relative 10 %   Monocytes Absolute 0.6 0.1 - 1.0 K/uL   Eosinophils Relative 1 %   Eosinophils Absolute 0.0 0.0 - 0.5 K/uL   Basophils Relative 0 %   Basophils Absolute 0.0 0.0 - 0.1 K/uL   Immature Granulocytes 0 %   Abs Immature Granulocytes 0.02 0.00 - 0.07 K/uL    Comment: Performed at [facility], [address], [place], [place] [address]  TSH     Status: None   Collection Time: 02/17/20  6:58 AM  Result Value Ref Range   TSH 0.949 0.350 - 4.500 uIU/mL    Comment: Performed by a 3rd Generation assay with a functional sensitivity of <=0.01 uIU/mL. Performed at [facility], [address], [place], [place] [address]   Lipid panel     Status: Abnormal   Collection Time: 02/17/20  6:58 AM  Result Value Ref Range   Cholesterol 128 0 - 200 mg/dL   Triglycerides 67 <269 mg/dL   HDL 27 (L) >48 mg/dL   Total CHOL/HDL Ratio 4.7 RATIO  VLDL 13 0 - 40 mg/dL   LDL Cholesterol 88 0 - 99 mg/dL    Comment:        Total Cholesterol/HDL:CHD Risk Coronary Heart Disease Risk Table                     Men   Women  1/2 Average Risk   3.4   3.3  Average Risk       5.0    4.4  2 X Average Risk   9.6   7.1  3 X Average Risk  23.4   11.0        Use the calculated Patient Ratio above and the CHD Risk Table to determine the patient's CHD Risk.        ATP III CLASSIFICATION (LDL):  <100     mg/dL   Optimal  016-010  mg/dL   Near or Above                    Optimal  130-159  mg/dL   Borderline  932-355  mg/dL   High  >732     mg/dL   Very High Performed at [facility], [address], [place], [place] [address]   Glucose, capillary     Status: Abnormal   Collection Time: 02/17/20  7:36 AM  Result Value Ref Range   Glucose-Capillary 144 (H) 70 - 99 mg/dL    Comment: Glucose reference range applies only to samples taken after fasting for at least 8 hours.  Glucose, capillary     Status: Abnormal   Collection Time: 02/17/20 11:22 AM  Result Value Ref Range   Glucose-Capillary 263 (H) 70 - 99 mg/dL    Comment: Glucose reference range applies only to samples taken after fasting for at least 8 hours.   Comment 1 Notify RN    Comment 2 Document in Chart     Current Facility-Administered Medications  Medication Dose Route Frequency Provider Last Rate Last Admin  . 0.9 %  sodium chloride infusion   Intravenous Continuous Darlin Drop, DO 75 mL/hr at 02/17/20 2706 New Bag at 02/17/20 0918  . dextrose 50 % solution 0-50 mL  0-50 mL Intravenous PRN Petrucelli, Samantha R, PA-C      . enoxaparin (LOVENOX) injection 40 mg  40 mg Subcutaneous Q24H Lyda Perone M, DO   40 mg at 02/17/20 0917  . feeding supplement (ENSURE ENLIVE / ENSURE PLUS) liquid 237 mL  237 mL Oral BID BM Dow Adolph N, DO   237 mL at 02/17/20 2376  . gabapentin (NEURONTIN) capsule 900 mg  900 mg Oral QHS Arrien, York Ram, MD   900 mg at 02/16/20 2255  . insulin aspart (novoLOG) injection 0-15 Units  0-15 Units Subcutaneous TID WC Arrien, York Ram, MD   2 Units at 02/17/20 0915  . insulin aspart (novoLOG) injection 10 Units  10 Units Subcutaneous TID WC  Dow Adolph N, DO   10 Units at 02/17/20 2831  . insulin glargine (LANTUS) injection 50 Units  50 Units Subcutaneous BID Darlin Drop, DO   50 Units at 02/17/20 5176  . sulfamethoxazole-trimethoprim (BACTRIM DS) 800-160 MG per tablet 1 tablet  1 tablet Oral Q12H Darlin Drop, DO        Musculoskeletal: Strength & Muscle Tone: within normal limits Gait & Station: Unable to assess Patient leans: N/A  Psychiatric Specialty Exam: Physical Exam  Review of Systems  Blood pressure  105/74, pulse 89, temperature 98.2 F (36.8 C), temperature source Oral, resp. rate 20, height  (1.778 m), weight 62 kg, SpO2 99 %.Body mass index is 19.61 kg/m.  General Appearance: Disheveled and Emaciated  Eye Contact:  Fair  Speech:  Garbled and Slow  Volume:  Normal  Mood:  Depressed and Dysphoric  Affect:  Blunt, Depressed and Flat  Thought Process:  Coherent, Linear and Descriptions of Associations: Intact  Orientation:  Full (Time, Place, and Person)  Thought Content:  Logical and Rumination  Suicidal Thoughts:  Yes.  without intent/plan  Homicidal Thoughts:  No  Memory:  Immediate;   Fair Recent;   Fair Remote;   Fair  Judgement:  Poor  Insight:  Lacking  Psychomotor Activity:  Psychomotor Retardation  Concentration:  Concentration: Fair and Attention Span: Fair  Recall:  Poor  Fund of Knowledge:  Fair  Language:  Fair  Akathisia:  Negative  Handed:  Right  AIMS (if indicated):     Assets:  Communication Skills Desire for Improvement Financial Resources/Insurance Housing Physical Health  ADL's:  Impaired  Cognition:  WNL  Sleep:        Treatment Plan Summary: Plan Will start fluoxetine 10 mg p.o. daily.  Will recommend hydroxyzine 10 mg p.o. three times daily.  Will limit use of benzodiazepines at this time.  Patient with apparent weight loss, muscle mass loss, and decreased appetite will benefit from ongoing physical therapy. Last EKG obtained on 10/27, QTc 447.    Disposition: Recommend psychiatric Inpatient admission when medically cleared. Will recommend inpatient psychiatric admission due to passive suicidal ideations, increased risk factors to include elderly Caucasian male, with worsening depressive symptoms in the setting of multiple comorbidities.  Will recommend working closely with social work to facilitate referral to inpatient facility may also include the [place].  Patient is established at our nearest hospital [place] [facility] [place].   Maryagnes Amos, FNP 02/17/2020 12:06 PM

## 2020-02-17 NOTE — Progress Notes (Signed)
Occupational Therapy Treatment Patient Details Name: Wayne Benson MRN: [id number] DOB: [birth year] Today's Date: 02/17/2020    History of present illness Wayne Benson is a 64 y.o. male with PMH significant for DM2, NASH cirrhosis, GERD, DVT, bipolar disorder, schizophrenia. Patient admitted for DKA and admits to 3-4 days of non-compliance with medications. Pt presents wtih weakness/fatigue, and c/o progressively worsening symptoms over last ~6 days.    OT comments  Educated patient on use of reacher and sock aid for LB dressing. Patient supervision level and mod cues for technique with both devices to doff/don socks seated EOB. Patient reports has 2 reachers at home, did provide with sock aid and shoe horn for home use. Patient supervision to pivot to recliner for safety due to impulsivity, mild unsteadiness and line management.    Follow Up Recommendations  No OT follow up    Equipment Recommendations  None recommended by OT       Precautions / Restrictions Precautions Precautions: Fall       Mobility Bed Mobility Overal bed mobility: Needs Assistance Bed Mobility: Supine to Sit     Supine to sit: HOB elevated;Supervision     General bed mobility comments: no assist required pt using bed rail to sit up.   Transfers Overall transfer level: Needs assistance Equipment used: None Transfers: Stand Pivot Transfers   Stand pivot transfers: Supervision       General transfer comment: no physical assistance to power up to standing, supervision for safety and line management to pivot to recliner    Balance Overall balance assessment: Needs assistance Sitting-balance support: Feet supported;No upper extremity supported Sitting balance-Leahy Scale: Good     Standing balance support: During functional activity Standing balance-Leahy Scale: Fair                             ADL either performed or assessed with clinical judgement   ADL Overall ADL's : Needs  assistance/impaired                     Lower Body Dressing: Sitting/lateral leans;With adaptive equipment;Supervision/safety Lower Body Dressing Details (indicate cue type and reason): provide instructions for use of reacher and sock aid for LB dressing, patient require mod cues for technique with reacher and sock aid including set up of sock aid. also provided patient with shoe horn Toilet Transfer: Supervision/safety;Stand-pivot Statistician Details (indicate cue type and reason): to recliner, supervision for safety                           Cognition Arousal/Alertness: Awake/alert Behavior During Therapy: WFL for tasks assessed/performed Overall Cognitive Status: No family/caregiver present to determine baseline cognitive functioning                                 General Comments: very tangential                   Pertinent Vitals/ Pain       Pain Assessment: Faces Faces Pain Scale: Hurts little more Pain Location: legs hurt from neuropathy Pain Descriptors / Indicators: Burning Pain Intervention(s): Monitored during session         Frequency  Min 2X/week        Progress Toward Goals  OT Goals(current goals can now be found in the care plan section)  Progress towards OT goals: Progressing toward goals  Acute Rehab OT Goals Patient Stated Goal: regain strength OT Goal Formulation: With patient Time For Goal Achievement: 03/01/20 Potential to Achieve Goals: Good  Plan Discharge plan remains appropriate       AM-PAC OT "6 Clicks" Daily Activity     Outcome Measure   Help from another person eating meals?: None Help from another person taking care of personal grooming?: A Little Help from another person toileting, which includes using toliet, bedpan, or urinal?: A Little Help from another person bathing (including washing, rinsing, drying)?: A Little Help from another person to put on and taking off regular upper body  clothing?: A Little Help from another person to put on and taking off regular lower body clothing?: A Little 6 Click Score: 19    End of Session  OT Visit Diagnosis: Unsteadiness on feet (R26.81)   Activity Tolerance Patient tolerated treatment well   Patient Left in chair;with call bell/phone within reach;with nursing/sitter in room   Nurse Communication Mobility status        Time: 5102-5852 OT Time Calculation (min): 18 min  Charges: OT General Charges $OT Visit: 1 Visit OT Treatments $Self Care/Home Management : 8-22 mins  Marlyce Huge OT OT pager: [phone]   Carmelia Roller 02/17/2020, 3:38 PM

## 2020-02-17 NOTE — Progress Notes (Signed)
Patient having nausea and vomiting. On-call attending notified via AMION, new order to administer IV zofran. Gucerna shake not given due to vomiting. HS meds will be given as nausea and vomiting decrease. Will continue to monitor the patient.

## 2020-02-17 NOTE — Progress Notes (Addendum)
PROGRESS NOTE  RICHIE BONANNO PIR:[id number] DOB: 1956/04/12 DOA: 02/15/2020 PCP: [facility]  HPI/Recap of past 45 hours: 64 year old male with past medical history for type 2 diabetes mellitus, nonalcoholic steatohepatitis with cirrhosis.  Patient developed progressive fatigue and poor oral intake 6 days prior to presentation, not taking his home insulin.  Work-up revealed DKA type II.  Was started on insulin drip and transition to subcu insulin.  Hemoglobin A1c 15.1 on 02/15/2020.  Diabetes coordinator was consulted for patient's education.  UA positive for pyuria on 02/15/2020, urine culture grew greater than 100,000 colonies of MRSA, sensitive to Bactrim.  Was switched from Rocephin to Bactrim on 02/17/2020.  Hospital course complicated by patient's reported depression and suicidal ideation for which psychiatry was consulted.  Started on antidepressants and anxiolytic, Prozac and as needed hydroxyzine on 02/17/2020.  One-to-one sitter in place for patient's own safety.  Psych recommended inpatient psychiatry.  TOC assisting with placement to inpatient psych.  02/17/20: Seen and examined at his bedside.  Continues to have suicidal ideation.  One-to-one sitter in the room.  Assessment/Plan: Principal Problem:   DKA (diabetic ketoacidosis) (HCC) Active Problems:   Liver cirrhosis secondary to NASH (HCC)   Pyuria  Resolved DKA, type 2 diabetes DKA has resolved, off IV insulin Hemoglobin A1c 15.1 on 02/15/2020 Currently on subcu insulin, Lantus 50 units twice daily, NovoLog 10 units 3 times daily, and insulin sliding scale. Continue to monitor CBGs  MRSA UTI, POA Was switched from Rocephin to Bactrim on 02/17/2020, day #1. Currently afebrile with no leukocytosis. No indwelling Foley catheter in place.  Depression with suicidal ideation Seen by psych, recommending inpatient psych. Started on Prozac and as needed hydroxyzine Continue suicidal precaution Continue  one-to-one sitter TOC assisting with inpatient psych placement  Type 2 diabetes, uncontrolled with hyperglycemia Management as stated above Diabetes coordinator following, appreciate recommendations.  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      Code Status: Full code  Family Communication: Updated his wife via phone.  Disposition Plan: Inpatient psych.   Consultants:  Diabetes coordinator  Psychiatry.  Procedures:  None  Antimicrobials:  Rocephin, 02/16/2020>> 02/17/2020.  Bactrim 02/17/2020>>  DVT prophylaxis: Subcu Lovenox daily  Status is: Inpatient    Dispo:  Patient From: Home  Planned Disposition: Inpatient psych.  Expected discharge date: 02/18/20  Medically stable for discharge: No, ongoing management of staph aureus UTI.         Objective: Vitals:   02/16/20 0500 02/16/20 1344 02/16/20 2103 02/17/20 0553  BP:  119/72 112/70 105/74  Pulse: 85 98 (!) 108 89  Resp:  18 20 20   Temp:  98.2 F (36.8 C) 99.4 F (37.4 C) 98.2 F (36.8 C)  TempSrc:  Oral Oral Oral  SpO2:  98% 97% 99%  Weight:      Height:        Intake/Output Summary (Last 24 hours) at 02/17/2020 1713 Last data filed at 02/17/2020 1500 Gross per 24 hour  Intake 3073.17 ml  Output 1600 ml  Net 1473.17 ml   Filed Weights   02/16/20 0400  Weight: 62 kg    Exam:  . General: 64 y.o. year-old male pleasant well-developed well-nourished.  Flat affect.  Alert and alert x3. . Cardiovascular: Regular  rate and rhythm no rubs or gallops.   . Respiratory: Marland Kitchenlear to auscultation no wheezes or rales.   . Abdomen: Soft nontender normal bowel sounds present.   . Musculoskeletal: No lower extremity edema bilaterally. Marland Kitchen. Psychiatry: Mood is appropriate for condition and setting.  Data Reviewed: CBC: Recent Labs  Lab 02/15/20 0133 02/17/20 0658  WBC 10.1 5.7  NEUTROABS 8.1* 3.7  HGB 12.9* 10.8*  HCT 37.4* 31.5*  MCV 86.8 87.7  PLT 105* 76*   Basic Metabolic Panel: Recent Labs  Lab 02/15/20 0537 02/15/20 0833 02/15/20 1307 02/16/20 0321 02/17/20 0658  NA 135 135 138 133* 136  K 4.2 3.6 3.7 3.7 3.8  CL 100 101 103 100 104  CO2 22 23 25 26 23   GLUCOSE 375* 232* 191* 328* 137*  BUN 33* 31* 29* 33* 26*  CREATININE 1.07 0.98 0.81 1.00 1.13  CALCIUM 8.7* 8.4* 8.7* 8.6* 8.5*   GFR: Estimated Creatinine Clearance: 57.9 mL/min (by C-G formula based on SCr of 1.13 mg/dL). Liver Function Tests: Recent Labs  Lab 02/15/20 0133  AST 13*  ALT 15  ALKPHOS 116  BILITOT 2.0*  PROT 7.1  ALBUMIN 3.3*   No results for input(s): LIPASE, AMYLASE in the last 168 hours. No results for input(s): AMMONIA in the last 168 hours. Coagulation Profile: No results for input(s): INR, PROTIME in the last 168 hours. Cardiac Enzymes: No results for input(s): CKTOTAL, CKMB, CKMBINDEX, TROPONINI in the last 168 hours. BNP (last 3 results) No results for input(s): PROBNP in the last 8760 hours. HbA1C: Recent Labs    02/15/20 0537 02/15/20 1713  HGBA1C 15.0* 15.1*   CBG: Recent Labs  Lab 02/16/20 1621 02/16/20 2105 02/17/20 0736 02/17/20 1122 02/17/20 1629  GLUCAP 303* 140* 144* 263* 131*   Lipid Profile: Recent Labs    02/17/20 0658  CHOL 128  HDL 27*  LDLCALC 88  TRIG 67  CHOLHDL 4.7   Thyroid Function Tests: Recent Labs    02/17/20 0658  TSH 0.949   Anemia Panel: No results for input(s): VITAMINB12, FOLATE, FERRITIN, TIBC, IRON, RETICCTPCT in the last 72 hours. Urine analysis:    Component Value Date/Time   COLORURINE YELLOW 02/15/2020 0354   APPEARANCEUR HAZY (A) 02/15/2020 0354   LABSPEC 1.022 02/15/2020 0354    PHURINE 7.0 02/15/2020 0354   GLUCOSEU >=500 (A) 02/15/2020 0354   HGBUR SMALL (A) 02/15/2020 0354   BILIRUBINUR NEGATIVE 02/15/2020 0354   KETONESUR 5 (A) 02/15/2020 0354   PROTEINUR NEGATIVE 02/15/2020 0354   NITRITE NEGATIVE 02/15/2020 0354   LEUKOCYTESUR MODERATE (A) 02/15/2020 0354   Sepsis Labs: @LABRCNTIP (procalcitonin:4,lacticidven:4)  ) Recent Results (from the past 240 hour(s))  Respiratory Panel by RT PCR (Flu A&B, Covid) - Nasopharyngeal Swab     Status: None   Collection Time: 02/15/20  1:34 AM   Specimen: Nasopharyngeal Swab  Result Value Ref Range Status   SARS Coronavirus 2 by RT PCR NEGATIVE NEGATIVE Final    Comment: (NOTE) SARS-CoV-2 target nucleic acids are NOT DETECTED.  The SARS-CoV-2 RNA is generally detectable in upper respiratoy specimens during the acute phase of infection. The lowest concentration of SARS-CoV-2 viral copies this assay can detect is 131 copies/mL. A negative result does not preclude SARS-Cov-2 infection and should not be used as the sole basis for treatment or other patient management decisions. A negative result may occur with  improper specimen collection/handling, submission of specimen other than nasopharyngeal swab, presence of viral mutation(s) within the areas targeted by this  assay, and inadequate number of viral copies (<131 copies/mL). A negative result must be combined with clinical observations, patient history, and epidemiological information. The expected result is Negative.  Fact Sheet for Patients:  https://www.moore.com/  Fact Sheet for Healthcare Providers:  https://www.young.biz/  This test is no t yet approved or cleared by the [place] FDA and  has been authorized for detection and/or diagnosis of SARS-CoV-2 by FDA under an Emergency Use Authorization (EUA). This EUA will remain  in effect (meaning this test can be used) for the duration of the COVID-19 declaration  under Section 564(b)(1) of the Act, 21 U.S.C. section 360bbb-3(b)(1), unless the authorization is terminated or revoked sooner.     Influenza A by PCR NEGATIVE NEGATIVE Final   Influenza B by PCR NEGATIVE NEGATIVE Final    Comment: (NOTE) The Xpert Xpress SARS-CoV-2/FLU/RSV assay is intended as an aid in  the diagnosis of influenza from Nasopharyngeal swab specimens and  should not be used as a sole basis for treatment. Nasal washings and  aspirates are unacceptable for Xpert Xpress SARS-CoV-2/FLU/RSV  testing.  Fact Sheet for Patients: https://www.moore.com/  Fact Sheet for Healthcare Providers: https://www.young.biz/  This test is not yet approved or cleared by the [place] FDA and  has been authorized for detection and/or diagnosis of SARS-CoV-2 by  FDA under an Emergency Use Authorization (EUA). This EUA will remain  in effect (meaning this test can be used) for the duration of the  Covid-19 declaration under Section 564(b)(1) of the Act, 21  U.S.C. section 360bbb-3(b)(1), unless the authorization is  terminated or revoked. Performed at [facility], [address] [place], [place] [address]   Culture, Urine     Status: Abnormal (Preliminary result)   Collection Time: 02/15/20  5:37 AM   Specimen: Urine, Random  Result Value Ref Range Status   Specimen Description   Final    URINE, RANDOM Performed at [facility], [address], [place], [place] [address]    Special Requests   Final    NONE Performed at [facility], [address], [place], [place] [address]    Culture (A)  Final    >=100,000 COLONIES/mL METHICILLIN RESISTANT STAPHYLOCOCCUS AUREUS CULTURE REINCUBATED FOR BETTER GROWTH Performed at [facility], [address], [place], [place] [address]    Report Status PENDING  Incomplete   Organism ID, Bacteria METHICILLIN RESISTANT STAPHYLOCOCCUS AUREUS  (A)  Final      Susceptibility   Methicillin resistant staphylococcus aureus - MIC*    CIPROFLOXACIN >=8 RESISTANT Resistant     GENTAMICIN <=0.5 SENSITIVE Sensitive     NITROFURANTOIN <=16 SENSITIVE Sensitive     OXACILLIN >=4 RESISTANT Resistant     TETRACYCLINE <=1 SENSITIVE Sensitive     VANCOMYCIN <=0.5 SENSITIVE Sensitive     TRIMETH/SULFA <=10 SENSITIVE Sensitive     CLINDAMYCIN <=0.25 SENSITIVE Sensitive     RIFAMPIN <=0.5 SENSITIVE Sensitive     Inducible Clindamycin NEGATIVE Sensitive     * >=100,000 COLONIES/mL METHICILLIN RESISTANT STAPHYLOCOCCUS AUREUS      Studies: No results found.  Scheduled Meds: . enoxaparin (LOVENOX) injection  40 mg Subcutaneous Q24H  . feeding supplement (GLUCERNA SHAKE)  237 mL Oral TID BM  . FLUoxetine  10 mg Oral Daily  . gabapentin  900 mg Oral QHS  . insulin aspart  0-15 Units Subcutaneous TID WC  . insulin aspart  10 Units Subcutaneous TID WC  . insulin glargine  50 Units Subcutaneous BID  . sulfamethoxazole-trimethoprim  1 tablet Oral Q12H    Continuous Infusions: . sodium chloride 75 mL/hr at 02/17/20 1222     LOS: 2 days     Darlin Drop, MD [facility] Pager [phone]  If 7PM-7AM, please contact night-coverage [email or web address] Password [email or web address] 02/17/2020, 5:13 PM

## 2020-02-18 LAB — RESPIRATORY PANEL BY RT PCR (FLU A&B, COVID)
Influenza A by PCR: NEGATIVE
Influenza B by PCR: NEGATIVE
SARS Coronavirus 2 by RT PCR: NEGATIVE

## 2020-02-18 LAB — COMPREHENSIVE METABOLIC PANEL
ALT: 34 U/L (ref 0–44)
AST: 73 U/L — ABNORMAL HIGH (ref 15–41)
Albumin: 2.4 g/dL — ABNORMAL LOW (ref 3.5–5.0)
Alkaline Phosphatase: 98 U/L (ref 38–126)
Anion gap: 7 (ref 5–15)
BUN: 27 mg/dL — ABNORMAL HIGH (ref 8–23)
CO2: 24 mmol/L (ref 22–32)
Calcium: 8 mg/dL — ABNORMAL LOW (ref 8.9–10.3)
Chloride: 103 mmol/L (ref 98–111)
Creatinine, Ser: 1.16 mg/dL (ref 0.61–1.24)
GFR, Estimated: >60 mL/min (ref >60)
Glucose, Bld: 155 mg/dL — ABNORMAL HIGH (ref 70–99)
Potassium: 3.8 mmol/L (ref 3.5–5.1)
Sodium: 134 mmol/L — ABNORMAL LOW (ref 135–145)
Total Bilirubin: 1.2 mg/dL (ref 0.3–1.2)
Total Protein: 5.4 g/dL — ABNORMAL LOW (ref 6.5–8.1)

## 2020-02-18 LAB — GLUCOSE, CAPILLARY
Glucose-Capillary: 114 mg/dL — ABNORMAL HIGH (ref 70–99)
Glucose-Capillary: 122 mg/dL — ABNORMAL HIGH (ref 70–99)
Glucose-Capillary: 142 mg/dL — ABNORMAL HIGH (ref 70–99)
Glucose-Capillary: 161 mg/dL — ABNORMAL HIGH (ref 70–99)

## 2020-02-18 LAB — CBC WITH DIFFERENTIAL/PLATELET
Abs Immature Granulocytes: 0.02 10*3/uL (ref 0.00–0.07)
Basophils Absolute: 0 10*3/uL (ref 0.0–0.1)
Basophils Relative: 0 %
Eosinophils Absolute: 0.1 10*3/uL (ref 0.0–0.5)
Eosinophils Relative: 1 %
HCT: 29 % — ABNORMAL LOW (ref 39.0–52.0)
Hemoglobin: 9.7 g/dL — ABNORMAL LOW (ref 13.0–17.0)
Immature Granulocytes: 0 %
Lymphocytes Relative: 19 %
Lymphs Abs: 1.1 10*3/uL (ref 0.7–4.0)
MCH: 29.6 pg (ref 26.0–34.0)
MCHC: 33.4 g/dL (ref 30.0–36.0)
MCV: 88.4 fL (ref 80.0–100.0)
Monocytes Absolute: 0.5 10*3/uL (ref 0.1–1.0)
Monocytes Relative: 8 %
Neutro Abs: 4.4 10*3/uL (ref 1.7–7.7)
Neutrophils Relative %: 72 %
Platelets: 76 10*3/uL — ABNORMAL LOW (ref 150–400)
RBC: 3.28 MIL/uL — ABNORMAL LOW (ref 4.22–5.81)
RDW: 13.2 % (ref 11.5–15.5)
WBC: 6.1 10*3/uL (ref 4.0–10.5)
nRBC: 0 % (ref 0.0–0.2)

## 2020-02-18 LAB — URINE CULTURE: Culture: >=100000 — AB

## 2020-02-18 LAB — MRSA PCR SCREENING: MRSA by PCR: POSITIVE — AB

## 2020-02-18 MED ORDER — SULFAMETHOXAZOLE-TRIMETHOPRIM 800-160 MG PO TABS: 1.0000 | ORAL_TABLET | Freq: Two times a day (BID) | ORAL | 0 refills | Status: AC

## 2020-02-18 MED ORDER — FLUOXETINE HCL 10 MG PO CAPS: 10.0000 mg | ORAL_CAPSULE | Freq: Every day | ORAL | 0 refills | Status: AC

## 2020-02-18 MED ORDER — ACETAMINOPHEN 325 MG PO TABS
650.0000 mg | ORAL_TABLET | Freq: Four times a day (QID) | ORAL | Status: DC | PRN
  Administered 2020-02-18: 650 mg via ORAL
  Filled 2020-02-18: qty 2

## 2020-02-18 MED ORDER — HYDROXYZINE HCL 10 MG PO TABS: 10.0000 mg | ORAL_TABLET | Freq: Three times a day (TID) | ORAL | 0 refills | Status: AC | PRN

## 2020-02-18 MED ORDER — INSULIN ASPART 100 UNIT/ML ~~LOC~~ SOLN: 10.0000 [IU] | Freq: Three times a day (TID) | SUBCUTANEOUS | 0 refills | Status: AC

## 2020-02-18 MED ORDER — SODIUM CHLORIDE 0.9 % IV BOLUS
500.0000 mL | Freq: Once | INTRAVENOUS | Status: AC
  Administered 2020-02-18: 500 mL via INTRAVENOUS

## 2020-02-18 MED ORDER — INSULIN GLARGINE 100 UNIT/ML SOLOSTAR PEN: 50.0000 [IU] | PEN_INJECTOR | Freq: Two times a day (BID) | SUBCUTANEOUS | 0 refills | Status: AC

## 2020-02-18 MED ORDER — GABAPENTIN 300 MG PO CAPS: 900.0000 mg | ORAL_CAPSULE | Freq: Every day | ORAL | 0 refills | Status: AC

## 2020-02-18 NOTE — TOC Progression Note (Signed)
Transition of Care [facility]) - Progression Note    Patient Details  Name: Wayne Benson MRN: [id number] Date of Birth: 1955-12-02  Transition of Care [facility]) CM/SW Contact  Ida Rogue, [place] Phone Number: 02/18/2020, 3:55 PM  Clinical Narrative:  Picked up v/m message from April at [facility] stating they have an opening, inviting me to send paperwork for review.  MD signed paperwork, [facility]  Paperwork faxed, along with contact numbers for after hours CSW and nursing station.  Patient was willing to sign paperwork stating that he consents to transfer.  TOC will continue to follow during the course of hospitalization.      Expected Discharge Plan: Psychiatric Hospital Barriers to Discharge: No Barriers Identified  Expected Discharge Plan and Services Expected Discharge Plan: Psychiatric Hospital   Discharge Planning Services: CM Consult Post Acute Care Choice: Home Health Living arrangements for the past 2 months: Single Family Home                         Representative spoke with at DME Agency[facility] Arranged: PT, OT       Representative spoke with at [facility]: [facility]   Social Determinants of Health (SDOH) Interventions    Readmission Risk Interventions No flowsheet data found.

## 2020-02-18 NOTE — Progress Notes (Signed)
Physical Therapy Treatment Patient Details Name: Wayne Benson MRN: [id number] DOB: 1955/08/11 Today's Date: 02/18/2020    History of Present Illness Wayne Benson is a 64 y.o. male with PMH significant for DM2, NASH cirrhosis, GERD, DVT, bipolar disorder, schizophrenia. Patient admitted for DKA and admits to 3-4 days of non-compliance with medications. Pt presents wtih weakness/fatigue, and c/o progressively worsening symptoms over last ~6 days.     PT Comments    Assisted OOB to amb to bathroom then in hallway.  General Gait Details: very unsteady gait with limited actitvity tolerance.  Poor balance with poor self correction responce time.  Inability to sel;f correct.  HIGH FALL RISK.   Follow Up Recommendations  Home health PT;Supervision - Intermittent     Equipment Recommendations  Rolling walker with 5" wheels;3in1 (PT)    Recommendations for Other Services       Precautions / Restrictions Precautions Precautions: Fall Precaution Comments: L great Toe amp recently    Mobility  Bed Mobility Overal bed mobility: Needs Assistance Bed Mobility: Supine to Sit     Supine to sit: Mod assist;Min assist     General bed mobility comments: assist with upper body and increased time to scoot to EOB  Transfers Overall transfer level: Needs assistance Equipment used: None Transfers: Sit to/from [employer] Sit to Stand: Min assist Stand pivot transfers: Min assist;Mod assist       General transfer comment: x 1 posterior LOB falling back onto bed with initial sit to stand.  Offered increased assist and also assist with seated toilet transfer with 25% VC's on proper hand placement and safety with turns.  Ambulation/Gait Ambulation/Gait assistance: Min guard;Min assist Gait Distance (Feet): 65 Feet Assistive device: Rolling walker (2 wheeled) Gait Pattern/deviations: Step-through pattern;Decreased step length - right;Decreased step length - left;Decreased  stride length;Trunk flexed Gait velocity: decreased   General Gait Details: very unsteady gait with limited actitvity tolerance.  Poor balance with poor self correction responce time.  Inability to sel;f correct.  HIGH FALL RISK.   Stairs             Wheelchair Mobility    Modified Rankin (Stroke Patients Only)       Balance                                            Cognition Arousal/Alertness: Awake/alert Behavior During Therapy: WFL for tasks assessed/performed Overall Cognitive Status: No family/caregiver present to determine baseline cognitive functioning                                 General Comments: AxO x 3 pleasant/cooperative      Exercises      General Comments        Pertinent Vitals/Pain Pain Assessment: No/denies pain    Home Living                      Prior Function            PT Goals (current goals can now be found in the care plan section) Progress towards PT goals: Progressing toward goals    Frequency    Min 3X/week      PT Plan Current plan remains appropriate    Co-evaluation  AM-PAC PT "6 Clicks" Mobility   Outcome Measure  Help needed turning from your back to your side while in a flat bed without using bedrails?: A Little Help needed moving from lying on your back to sitting on the side of a flat bed without using bedrails?: A Little Help needed moving to and from a bed to a chair (including a wheelchair)?: A Little Help needed standing up from a chair using your arms (e.g., wheelchair or bedside chair)?: A Little Help needed to walk in hospital room?: A Lot Help needed climbing 3-5 steps with a railing? : A Lot 6 Click Score: 16    End of Session Equipment Utilized During Treatment: Gait belt Activity Tolerance: Patient tolerated treatment well Patient left: in bed;with call bell/phone within reach;with nursing/sitter in room Nurse Communication:  Mobility status PT Visit Diagnosis: Muscle weakness (generalized) (M62.81);Difficulty in walking, not elsewhere classified (R26.2)     Time: 1510-1535 PT Time Calculation (min) (ACUTE ONLY): 25 min  Charges:  $Gait Training: 8-22 mins $Therapeutic Activity: 8-22 mins                     Felecia Shelling  PTA Acute  Rehabilitation Services Pager      [phone] Office      [phone]

## 2020-02-18 NOTE — Progress Notes (Signed)
   02/18/20 0400  Assess: MEWS Score  Temp 98 F (36.7 C)  BP 100/70  Pulse Rate 88  ECG Heart Rate 86  Resp 20  Level of Consciousness Alert  SpO2 93 %  O2 Device Room Air  Patient Activity (if Appropriate) In bed  Assess: MEWS Score  MEWS Temp 0  MEWS Systolic 1  MEWS Pulse 0  MEWS RR 0  MEWS LOC 0  MEWS Score 1  MEWS Score Color Green  Assess: if the MEWS score is Yellow or Red  Were vital signs taken at a resting state? Yes  Focused Assessment No change from prior assessment  Early Detection of Sepsis Score *See Row Information* High  MEWS guidelines implemented *See Row Information* No, previously yellow, continue vital signs every 4 hours  Treat  MEWS Interventions Other (Comment) (continue to monitor)  Pain Scale 0-10  Pain Score 0  Take Vital Signs  Increase Vital Sign Frequency  Yellow: Q 2hr X 2 then Q 4hr X 2, if remains yellow, continue Q 4hrs  Document  Patient Outcome Stabilized after interventions  Progress note created (see row info) Yes   Patient continue to be a green MEWS with no distress noted. No changes to the focused assessment. Yellow Mews interventions and monitor will continue.

## 2020-02-18 NOTE — Progress Notes (Signed)
This note also relates to the following rows which could not be included: ECG Heart Rate - Cannot attach notes to unvalidated device data    02/18/20 0000  Assess: MEWS Score  Temp (!) 100.6 F (38.1 C)  Pulse Rate 92  Resp (!) 24  Level of Consciousness Alert  O2 Device Room Air  Patient Activity (if Appropriate) In bed  Assess: MEWS Score  MEWS Temp 1  MEWS Systolic 0  MEWS Pulse 0  MEWS RR 1  MEWS LOC 0  MEWS Score 2  MEWS Score Color Yellow  Assess: if the MEWS score is Yellow or Red  Were vital signs taken at a resting state? Yes  Focused Assessment No change from prior assessment  Early Detection of Sepsis Score *See Row Information* High  MEWS guidelines implemented *See Row Information* Yes  Treat  MEWS Interventions Escalated (See documentation below)  Pain Scale 0-10  Pain Score 0  Pain Type Acute pain  Pain Location Leg  Pain Orientation Right;Left  Pain Descriptors / Indicators Aching  Pain Frequency Occasional  Pain Onset With Activity  Pain Intervention(s) MD notified (Comment)  Multiple Pain Sites No  Take Vital Signs  Increase Vital Sign Frequency  Yellow: Q 2hr X 2 then Q 4hr X 2, if remains yellow, continue Q 4hrs  Escalate  MEWS: Escalate Yellow: discuss with charge nurse/RN and consider discussing with provider and RRT  Notify: Charge Nurse/RN  Name of Charge Nurse/RN Notified Debbie RN  Date Charge Nurse/RN Notified 02/18/20  Time Charge Nurse/RN Notified 0015  Notify: Provider  Provider Name/Title M.Katherina Right  Date Provider Notified 02/18/20  Time Provider Notified [phone]  Notification Type Page  Notification Reason Other (Comment);Change in status (elevated temp and respirations)  Response See new orders  Date of Provider Response 02/18/20  Time of Provider Response 0041  Document  Patient Outcome Other (Comment)  Progress note created (see row info) Yes   Patient yellow MEWS b/c of elevated temp and respirations. Yellow MEWS protocol  initiated. Patient is alert, verbal and able to make needs known. Focused assessment complete with no changes noted. Charge nurse notified and on-call attending notified via AMION. New orders and initiated, bolus of NS and tylenol administered. Will continue to monitor the patient.

## 2020-02-18 NOTE — TOC Progression Note (Addendum)
Transition of Care [facility]) - Progression Note    Patient Details  Name: RUGER SAXER MRN: [id number] Date of Birth: 1955/09/20  [facility] CM/SW Contact  Ida Rogue, [place] Phone Number: 02/18/2020, 10:10 AM  Clinical Narrative:   [facility], [facility] denies based on n/v last night, and continues to state he is more appropriate for geropsych.  No one answering at [facility] this AM.  Will continue to try. TOC will continue to follow during the course of hospitalization.  Addendum: FAXed info to [facility], [facility] and [facility].  Still no one picking up at [facility].    Expected Discharge Plan: Home w Home Health Services Barriers to Discharge: No Barriers Identified  Expected Discharge Plan and Services Expected Discharge Plan: Home w Home Health Services   Discharge Planning Services: CM Consult Post Acute Care Choice: Home Health Living arrangements for the past 2 months: Single Family Home                         Representative spoke with at [facility] Arranged: PT, OT       Representative spoke with at [facility]   Social Determinants of Health (SDOH) Interventions    Readmission Risk Interventions No flowsheet data found.

## 2020-02-18 NOTE — Discharge Summary (Signed)
Discharge Summary  Wayne Benson [id number] DOB: April 26, 1955  [facility]  Admit date: 02/15/2020 Discharge date: 02/18/2020  Time spent: 35 minutes.  Recommendations for Outpatient Follow-up:  1. Follow up with psychiatry 2. Follow up with your PCP 3. Take your medications as prescribed  Discharge Diagnoses:  Active Hospital Problems   Diagnosis Date Noted  . DKA (diabetic ketoacidosis) (HCC) 02/15/2020  . Pyuria 02/15/2020  . Liver cirrhosis secondary to NASH [facility](HCC) 01/20/2016    Resolved Hospital Problems  No resolved problems to display.    Discharge Condition: Stable  Diet recommendation: Carb modified diet.  Vitals:   02/18/20 1205 02/18/20 1345  BP: 107/67 109/69  Pulse: 91 95  Resp: 20 18  Temp: (!) 97.4 F (36.3 C) 98.6 F (37 C)  SpO2: 93% 96%    History of present illness:  64 year old male with past medical history for type 2 diabetes mellitus, nonalcoholic steatohepatitis with cirrhosis. Patient developed progressive fatigue and poor oral intake 6 days prior to presentation, not taking his homeinsulin.  Work-up revealed DKA type II.  Was started on insulin drip and transitioned to subcu insulin.  Hemoglobin A1c 15.1 on 02/15/2020.  Diabetes coordinator was consulted for patient's education.  Blood sugar now controlled.  UA positive for pyuria on 02/15/2020, urine culture grew greater than 100,000 colonies of MRSA, sensitive to Bactrim.  Was switched from Rocephin to Bactrim on 02/17/2020.  Hospital course complicated by patient's reported depression and suicidal ideation for which psychiatry was consulted.  Started on antidepressants and anxiolytic, Prozac and as needed hydroxyzine on 02/17/2020.  One-to-one sitter in place for patient's own safety.  Psych recommended inpatient psychiatry.  TOC assisting with placement to inpatient psych.  Patient is agreeable with this plan.  02/18/20: Seen and examined at his bedside.  Continues  to have suicidal ideation.  One-to-one sitter in the room.   Hospital Course:  Principal Problem:   DKA (diabetic ketoacidosis) (HCC) Active Problems:   Liver cirrhosis secondary to NASH (HCC)   Pyuria  Resolved DKA, type 2 diabetes DKA has resolved, off IV insulin Hemoglobin A1c 15.1 on 02/15/2020 Currently on subcu insulin, Lantus 50 units twice daily, NovoLog 10 units 3 times daily, and insulin sliding scale. Follow up with your PCP  MRSA UTI, POA Was switched from Rocephin to Bactrim on 02/17/2020, day #2. Currently afebrile with no leukocytosis. Blood cultures x2 no growth to date. No indwelling Foley catheter in place.  Depression with suicidal ideation Seen by psych, recommending inpatient psych. Started on Prozac and as needed hydroxyzine Continue suicidal precaution Continue one-to-one sitter  Type 2 diabetes, initially uncontrolled with hyperglycemia, now controlled. Management as stated above Diabetes coordinator following, appreciate recommendations.  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      Code Status: Full code  Family Communication: Updated his wife via phone.  Disposition Plan: Inpatient psych.   Consultants:  Diabetes coordinator.  Psychiatry.  Procedures:  None  Antimicrobials:  Rocephin, 02/16/2020>> 02/17/2020.  Bactrim 02/17/2020>>x 10 days   Discharge Exam: BP 109/69 (BP Location: Right Arm)   Pulse 95   Temp 98.6 F (37 C)   Resp 18   Ht 5\' 10"  (1.778 m)   Wt 62 kg   SpO2 96%  BMI 19.61 kg/m  . General: 64 y.o. year-old male well developed well nourished in no acute distress.  Alert and  oriented x3. . Cardiovascular: Regular rate and rhythm with no rubs or gallops.  No thyromegaly or JVD noted.   Marland Kitchen Respiratory: Clear to auscultation with no wheezes or rales. Good inspiratory effort. . Abdomen: Soft nontender nondistended with normal bowel sounds x4 quadrants. . Musculoskeletal: No lower extremity edema bilaterally. Marland Kitchen Psychiatry: Mood is appropriate for condition and setting  Discharge Instructions You were cared for by a hospitalist during your hospital stay. If you have any questions about your discharge medications or the care you received while you were in the hospital after you are discharged, you can call the unit and asked to speak with the hospitalist on call if the hospitalist that took care of you is not available. Once you are discharged, your primary care physician will handle any further medical issues. Please note that NO REFILLS for any discharge medications will be authorized once you are discharged, as it is imperative that you return to your primary care physician (or establish a relationship with a primary care physician if you do not have one) for your aftercare needs so that they can reassess your need for medications and monitor your lab values.   Allergies as of 02/18/2020      Reactions   Duloxetine    Other reaction(s): Other (See Comments) Hallucinations Hallucinations   Pregabalin    Other reaction(s): Other (See Comments) hallucinations hallucinations   Trazodone Hives, Nausea Only, Nausea And Vomiting      Medication List    STOP taking these medications   albuterol 108 (90 Base) MCG/ACT inhaler Commonly known as: VENTOLIN HFA   chlorpheniramine-HYDROcodone 10-8 MG/5ML Suer Commonly known as: TUSSIONEX   feeding supplement (GLUCERNA SHAKE) Liqd   guaiFENesin 600 MG 12 hr tablet Commonly known as: MUCINEX   insulin glargine 100 UNIT/ML injection Commonly known as: LANTUS Replaced by: insulin glargine 100 UNIT/ML Solostar Pen       TAKE these medications   acetaminophen 500 MG tablet Commonly known as: TYLENOL Take 1,500 mg by mouth every 6 (six) hours as needed for mild pain.   FLUoxetine 10 MG capsule Commonly known as: PROZAC Take 1 capsule (10 mg total) by mouth daily. Start taking on: February 19, 2020   gabapentin 300 MG capsule Commonly known as: NEURONTIN Take 3 capsules (900 mg total) by mouth at bedtime.   hydrOXYzine 10 MG tablet Commonly known as: ATARAX/VISTARIL Take 1 tablet (10 mg total) by mouth 3 (three) times daily as needed for anxiety.   insulin aspart 100 UNIT/ML injection Commonly known as: novoLOG Inject 10 Units into the skin 3 (three) times daily with meals. What changed:   how much to take  Another medication with the same name was removed. Continue taking this medication, and follow the directions you see here.   insulin glargine 100 UNIT/ML Solostar Pen Commonly known as: LANTUS Inject 50 Units into the skin 2 (two) times daily. Replaces: insulin glargine 100 UNIT/ML injection   ipratropium-albuterol 0.5-2.5 (3) MG/3ML Soln Commonly known as: DUONEB Take 3 mLs by nebulization every 6 (six) hours as needed. What changed: Another medication with the same name was removed. Continue taking this medication, and follow the directions you see here.   sulfamethoxazole-trimethoprim 800-160 MG tablet Commonly known as: BACTRIM DS Take 1 tablet by mouth every 12 (twelve) hours for 8 days.  Durable Medical Equipment  (From admission, onward)         Start     Ordered   02/16/20 0755  For home use only DME Walker rolling  Once       Question Answer Comment  Walker: With 5 Inch Wheels   Patient needs a walker to treat with the following condition Ambulatory dysfunction      02/16/20 0754   02/16/20 0755  For home use only DME 3 n 1  Once        02/16/20 0754         Allergies  Allergen Reactions  . Duloxetine     Other reaction(s): Other (See  Comments) Hallucinations Hallucinations   . Pregabalin     Other reaction(s): Other (See Comments) hallucinations hallucinations   . Trazodone Hives, Nausea Only and Nausea And Vomiting    Follow-up Information    [facility] up.   Contact information: [address] [facility] [place] [address] [phone]                The results of significant diagnostics from this hospitalization (including imaging, microbiology, ancillary and laboratory) are listed below for reference.    Significant Diagnostic Studies: DG Chest Portable 1 View  Result Date: 02/15/2020 CLINICAL DATA:  Dyspnea.  Hyperglycemia. EXAM: PORTABLE CHEST 1 VIEW COMPARISON:  Radiograph 08/23/2016. High-resolution chest CT 10/22/2016 FINDINGS: Patient is rotated. The heart is normal in size. Mild aortic tortuosity. Previous subpleural opacities prior chest CT have resolved. No acute airspace disease. Multiple skin folds project over the left chest. No pulmonary edema. No pleural effusion. No acute osseous abnormalities are seen. IMPRESSION: 1. No acute abnormality. 2. Multiple skin folds project over the left chest. Electronically Signed   By: Narda Rutherford M.D.   On: 02/15/2020 01:49    Microbiology: Recent Results (from the past 240 hour(s))  Respiratory Panel by RT PCR (Flu A&B, Covid) - Nasopharyngeal Swab     Status: None   Collection Time: 02/15/20  1:34 AM   Specimen: Nasopharyngeal Swab  Result Value Ref Range Status   SARS Coronavirus 2 by RT PCR NEGATIVE NEGATIVE Final    Comment: (NOTE) SARS-CoV-2 target nucleic acids are NOT DETECTED.  The SARS-CoV-2 RNA is generally detectable in upper respiratoy specimens during the acute phase of infection. The lowest concentration of SARS-CoV-2 viral copies this assay can detect is 131 copies/mL. A negative result does not preclude SARS-Cov-2 infection and should not be used as the sole basis for treatment or other  patient management decisions. A negative result may occur with  improper specimen collection/handling, submission of specimen other than nasopharyngeal swab, presence of viral mutation(s) within the areas targeted by this assay, and inadequate number of viral copies (<131 copies/mL). A negative result must be combined with clinical observations, patient history, and epidemiological information. The expected result is Negative.  Fact Sheet for Patients:  https://www.moore.com/  Fact Sheet for Healthcare Providers:  https://www.young.biz/  This test is no t yet approved or cleared by the [place] FDA and  has been authorized for detection and/or diagnosis of SARS-CoV-2 by FDA under an Emergency Use Authorization (EUA). This EUA will remain  in effect (meaning this test can be used) for the duration of the COVID-19 declaration under Section 564(b)(1) of the Act, 21 U.S.C. section 360bbb-3(b)(1), unless the authorization is terminated or revoked sooner.     Influenza A by PCR NEGATIVE NEGATIVE Final   Influenza B by PCR  NEGATIVE NEGATIVE Final    Comment: (NOTE) The Xpert Xpress SARS-CoV-2/FLU/RSV assay is intended as an aid in  the diagnosis of influenza from Nasopharyngeal swab specimens and  should not be used as a sole basis for treatment. Nasal washings and  aspirates are unacceptable for Xpert Xpress SARS-CoV-2/FLU/RSV  testing.  Fact Sheet for Patients: https://www.moore.com/  Fact Sheet for Healthcare Providers: https://www.young.biz/  This test is not yet approved or cleared by the [place] FDA and  has been authorized for detection and/or diagnosis of SARS-CoV-2 by  FDA under an Emergency Use Authorization (EUA). This EUA will remain  in effect (meaning this test can be used) for the duration of the  Covid-19 declaration under Section 564(b)(1) of the Act, 21  U.S.C. section  360bbb-3(b)(1), unless the authorization is  terminated or revoked. Performed at [facility], [address] [place], [place] [address]   Culture, Urine     Status: Abnormal   Collection Time: 02/15/20  5:37 AM   Specimen: Urine, Random  Result Value Ref Range Status   Specimen Description   Final    URINE, RANDOM Performed at [facility], [address] [place], [place] [address]    Special Requests   Final    NONE Performed at [facility], [address], [place], [place] [address]    Culture (A)  Final    >=100,000 COLONIES/mL METHICILLIN RESISTANT STAPHYLOCOCCUS AUREUS   Report Status 02/18/2020 FINAL  Final   Organism ID, Bacteria METHICILLIN RESISTANT STAPHYLOCOCCUS AUREUS (A)  Final      Susceptibility   Methicillin resistant staphylococcus aureus - MIC*    CIPROFLOXACIN >=8 RESISTANT Resistant     GENTAMICIN <=0.5 SENSITIVE Sensitive     NITROFURANTOIN <=16 SENSITIVE Sensitive     OXACILLIN >=4 RESISTANT Resistant     TETRACYCLINE <=1 SENSITIVE Sensitive     VANCOMYCIN <=0.5 SENSITIVE Sensitive     TRIMETH/SULFA <=10 SENSITIVE Sensitive     CLINDAMYCIN <=0.25 SENSITIVE Sensitive     RIFAMPIN <=0.5 SENSITIVE Sensitive     Inducible Clindamycin NEGATIVE Sensitive     * >=100,000 COLONIES/mL METHICILLIN RESISTANT STAPHYLOCOCCUS AUREUS  Culture, blood (routine x 2)     Status: None (Preliminary result)   Collection Time: 02/18/20  6:42 AM   Specimen: BLOOD RIGHT ARM  Result Value Ref Range Status   Specimen Description   Final    BLOOD RIGHT ARM Performed at [facility], [address], [place], [place] [address]    Special Requests   Final    BOTTLES DRAWN AEROBIC AND ANAEROBIC Blood Culture adequate volume Performed at [facility], [address], [place], [place] [address]    Culture   Final    NO GROWTH < 12 HOURS Performed at [facility], [address] [place], [place] [address]    Report Status PENDING  Incomplete  Culture, blood (routine x 2)     Status: None (Preliminary result)   Collection Time: 02/18/20  6:42 AM   Specimen: BLOOD RIGHT HAND  Result Value Ref Range Status   Specimen Description   Final    BLOOD RIGHT HAND Performed at [facility], [address], [place], [place] [address]    Special Requests   Final    BOTTLES DRAWN AEROBIC AND ANAEROBIC Blood Culture adequate volume Performed at [facility], [address] [place], [place] [address]    Culture  Final    NO GROWTH < 12 HOURS Performed at [facility], [address] [place], [place] [address]    Report Status PENDING  Incomplete     Labs: Basic Metabolic Panel: Recent Labs  Lab 02/15/20 0833 02/15/20 1307 02/16/20 0321 02/17/20 0658 02/18/20 0642  NA 135 138 133* 136 134*  K 3.6 3.7 3.7 3.8 3.8  CL 101 103 100 104 103  CO2 23 25 26 23 24   GLUCOSE 232* 191* 328* 137* 155*  BUN 31* 29* 33* 26* 27*  CREATININE 0.98 0.81 1.00 1.13 1.16  CALCIUM 8.4* 8.7* 8.6* 8.5* 8.0*   Liver Function Tests: Recent Labs  Lab 02/15/20 0133 02/18/20 0642  AST 13* 73*  ALT 15 34  ALKPHOS 116 98  BILITOT 2.0* 1.2  PROT 7.1 5.4*  ALBUMIN 3.3* 2.4*   No results for input(s): LIPASE, AMYLASE in the last 168 hours. No results for input(s): AMMONIA in the last 168 hours. CBC: Recent Labs  Lab 02/15/20 0133 02/17/20 0658 02/18/20 0642  WBC 10.1 5.7 6.1  NEUTROABS 8.1* 3.7 4.4  HGB 12.9* 10.8* 9.7*  HCT 37.4* 31.5* 29.0*  MCV 86.8 87.7 88.4  PLT 105* 76* 76*   Cardiac Enzymes: No results for input(s): CKTOTAL, CKMB, CKMBINDEX, TROPONINI in the last 168 hours. BNP: BNP (last 3 results) No results for input(s): BNP in the last 8760 hours.  ProBNP (last 3 results) No results for input(s): PROBNP in the last 8760 hours.  CBG: Recent Labs  Lab 02/17/20 1122 02/17/20 1629 02/17/20 2135  02/18/20 0835 02/18/20 1151  GLUCAP 263* 131* 203* 114* 122*       Signed:  02/20/20, MD [facility] 02/18/2020, 4:16 PM

## 2020-02-18 NOTE — Progress Notes (Signed)
   02/18/20 0209  Assess: MEWS Score  Temp 99.9 F (37.7 C)  BP 102/64  Pulse Rate 98  Resp (!) 22  Level of Consciousness Alert  SpO2 94 %  O2 Device Room Air  Patient Activity (if Appropriate) In bed  Assess: MEWS Score  MEWS Temp 0  MEWS Systolic 0  MEWS Pulse 0  MEWS RR 1  MEWS LOC 0  MEWS Score 1  MEWS Score Color Green  Assess: if the MEWS score is Yellow or Red  Were vital signs taken at a resting state? Yes  Focused Assessment No change from prior assessment  Early Detection of Sepsis Score *See Row Information* Low  MEWS guidelines implemented *See Row Information* No, previously yellow, continue vital signs every 4 hours  Treat  Pain Scale 0-10  Pain Score 0  Document  Patient Outcome Stabilized after interventions  Progress note created (see row info) Yes   Patient is green MEWS after previous interventions. No new changes to focused assessment. Patient is alert, verbal and able to make needs known. Ice packs remain under patients arm to continue to decrease elevated temp. Will continue to monitor the patient and continue previous yellow MEWS guidelines.

## 2020-02-19 LAB — GLUCOSE, CAPILLARY
Glucose-Capillary: 164 mg/dL — ABNORMAL HIGH (ref 70–99)
Glucose-Capillary: 156 mg/dL — ABNORMAL HIGH (ref 70–99)

## 2020-02-19 NOTE — Progress Notes (Signed)
Called [facility] and awaiting a ride for patient's discharge.

## 2020-02-19 NOTE — TOC Transition Note (Signed)
Transition of Care [facility]) - CM/SW Discharge Note   Patient Details  Name: Wayne Benson MRN: [id number] Date of Birth: 1955/12/12  [facility] CM/SW Contact:  Darleene Cleaver, LCSW Phone Number: 02/19/2020, 11:49 AM   Clinical Narrative:     Patient to be d/c'ed today to [facility] behavioral health.  Patient and family agreeable to plans will transport via [employer] optician to call report to [phone] ext [phone].  Patient's wife notified of planned discharge today.      Final next level of care: Psychiatric Hospital Barriers to Discharge: Barriers Resolved   Patient Goals and CMS Choice Patient states their goals for this hospitalization and ongoing recovery are:: To go to behavioral health at [facility] and eventually return back home. CMS Medicare.gov Compare Post Acute Care list provided to:: Patient Choice offered to / list presented to : Patient  Discharge Placement              Patient chooses bed at: Other - please specify in the comment section below: [facility]) Patient to be transferred to facility by: [facility] services. Name of family member notified: Patient's wife [name] [phone] Patient and family notified of of transfer: 02/19/20  Discharge Plan and Services   Discharge Planning Services: CM Consult Post Acute Care Choice: Home Health            DME Agency: NA     Representative spoke with at [facility]ranged: NA       Representative spoke with at [facility]  Social Determinants of Health (SDOH) Interventions     Readmission Risk Interventions No flowsheet data found.

## 2020-02-19 NOTE — Progress Notes (Signed)
Gave report to [employer] RN [place].

## 2020-02-19 NOTE — Progress Notes (Signed)
[facility] arrived for patient.

## 2020-02-19 NOTE — Discharge Summary (Signed)
Discharge Summary  Wayne Benson VEH:[id number] DOB: [birth year]  PCP: [facility]  Admit date: 02/15/2020 Discharge date: 02/19/2020  Time spent: 35 minutes.  Recommendations for Outpatient Follow-up:  1. Follow up with psychiatry within a week. 2. Follow up with your PCP in 1-2 weeks. 3. Take your medications as prescribed.  Discharge Diagnoses:  Active Hospital Problems   Diagnosis Date Noted  . DKA (diabetic ketoacidosis) (HCC) 02/15/2020  . Pyuria 02/15/2020  . Liver cirrhosis secondary to NASH [facility]) 01/20/2016    Resolved Hospital Problems  No resolved problems to display.    Discharge Condition: Stable  Diet recommendation: Carb modified diet.  Vitals:   02/18/20 2052 02/19/20 0447  BP: 115/73 115/90  Pulse: 93 91  Resp: 18 18  Temp: 98.9 F (37.2 C) 98.6 F (37 C)  SpO2: 95% 96%    History of present illness:  64 year old male with past medical history for type 2 diabetes mellitus, nonalcoholic steatohepatitis with cirrhosis. Patient developed progressive fatigue and poor oral intake 6 days prior to presentation, not taking his homeinsulin.  Work-up revealed DKA type II.  Was started on insulin drip and transitioned to subcu insulin.  Hemoglobin A1c 15.1 on 02/15/2020.  Diabetes coordinator was consulted for patient's education.  Blood sugar now controlled.  UA positive for pyuria on 02/15/2020, urine culture grew greater than 100,000 colonies of MRSA, sensitive to Bactrim.  Was switched from Rocephin to Bactrim on 02/17/2020.  Hospital course complicated by patient's reported depression and suicidal ideation for which psychiatry was consulted.  Started on antidepressants and anxiolytic, Prozac and as needed hydroxyzine on 02/17/2020.  One-to-one sitter in place for patient's own safety.  Psych recommended inpatient psychiatry.  TOC assisting with placement to inpatient psych.  Patient is agreeable with this plan.  02/19/20: Seen and examined  at his bedside.  Continues to have suicidal ideation.  One-to-one sitter in the room.  No acute events overnight.  He is receptive to getting help.   Hospital Course:  Principal Problem:   DKA (diabetic ketoacidosis) (HCC) Active Problems:   Liver cirrhosis secondary to NASH (HCC)   Pyuria  Resolved DKA, type 2 diabetes DKA has resolved, off IV insulin Hemoglobin A1c 15.1 on 02/15/2020 Currently on subcu insulin, Lantus 50 units twice daily, NovoLog 10 units 3 times daily, and insulin sliding scale. Follow up with your PCP  MRSA UTI, POA Was switched from Rocephin to Bactrim on 02/17/2020, day #3/10. Currently afebrile with no leukocytosis. Blood cultures x2 no growth to date. No indwelling Foley catheter in place. Follow up with PCP  Depression with suicidal ideation Seen by psych, recommending inpatient psych. Started on Prozac and as needed hydroxyzine Continue suicidal precaution Continue one-to-one sitter Transfer to inpatient psych   Type 2 diabetes, initially uncontrolled with hyperglycemia, now controlled. 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      Code Status: Full code  Family Communication: Updated his wife via phone.  Disposition Plan: Inpatient psych.   Consultants:  Diabetes coordinator.  Psychiatry.  Procedures:  None  Antimicrobials:  Rocephin, 02/16/2020>> 02/17/2020.  Bactrim 02/17/2020>>x 10 days   Discharge Exam: BP 115/90 (BP Location: Right Arm)  Pulse 91   Temp 98.6 F (37 C) (Oral)   Resp 18   Ht 5\' 10"  (1.778 m)    Wt 62 kg   SpO2 96%   BMI 19.61 kg/m  . General: 64 y.o. year-old male well developed well nourished in no acute distress.  Alert and oriented x3. . Cardiovascular: Regular rate and rhythm with no rubs or gallops.  No thyromegaly or JVD noted.   Marland Kitchen. Respiratory: Clear to auscultation with no wheezes or rales. Good inspiratory effort. . Abdomen: Soft nontender nondistended with normal bowel sounds x4 quadrants. . Musculoskeletal: No lower extremity edema bilaterally. Marland Kitchen. Psychiatry: Mood is appropriate for condition and setting  Discharge Instructions You were cared for by a hospitalist during your hospital stay. If you have any questions about your discharge medications or the care you received while you were in the hospital after you are discharged, you can call the unit and asked to speak with the hospitalist on call if the hospitalist that took care of you is not available. Once you are discharged, your primary care physician will handle any further medical issues. Please note that NO REFILLS for any discharge medications will be authorized once you are discharged, as it is imperative that you return to your primary care physician (or establish a relationship with a primary care physician if you do not have one) for your aftercare needs so that they can reassess your need for medications and monitor your lab values.   Allergies as of 02/19/2020      Reactions   Duloxetine    Other reaction(s): Other (See Comments) Hallucinations Hallucinations   Pregabalin    Other reaction(s): Other (See Comments) hallucinations hallucinations   Trazodone Hives, Nausea Only, Nausea And Vomiting      Medication List    STOP taking these medications   albuterol 108 (90 Base) MCG/ACT inhaler Commonly known as: VENTOLIN HFA   chlorpheniramine-HYDROcodone 10-8 MG/5ML Suer Commonly known as: TUSSIONEX   feeding supplement (GLUCERNA SHAKE) Liqd   guaiFENesin 600 MG 12 hr tablet Commonly known as:  MUCINEX   insulin glargine 100 UNIT/ML injection Commonly known as: LANTUS Replaced by: insulin glargine 100 UNIT/ML Solostar Pen     TAKE these medications   acetaminophen 500 MG tablet Commonly known as: TYLENOL Take 1,500 mg by mouth every 6 (six) hours as needed for mild pain.   FLUoxetine 10 MG capsule Commonly known as: PROZAC Take 1 capsule (10 mg total) by mouth daily.   gabapentin 300 MG capsule Commonly known as: NEURONTIN Take 3 capsules (900 mg total) by mouth at bedtime.   hydrOXYzine 10 MG tablet Commonly known as: ATARAX/VISTARIL Take 1 tablet (10 mg total) by mouth 3 (three) times daily as needed for anxiety.   insulin aspart 100 UNIT/ML injection Commonly known as: novoLOG Inject 10 Units into the skin 3 (three) times daily with meals. What changed:   how much to take  Another medication with the same name was removed. Continue taking this medication, and follow the directions you see here.   insulin glargine 100 UNIT/ML Solostar Pen Commonly known as: LANTUS Inject 50 Units into the skin 2 (two) times daily. Replaces: insulin glargine 100 UNIT/ML injection   ipratropium-albuterol 0.5-2.5 (3) MG/3ML Soln Commonly known as: DUONEB Take 3 mLs by nebulization every 6 (six) hours as needed. What changed: Another medication with the same name was removed. Continue taking this medication, and follow the directions you see here.   sulfamethoxazole-trimethoprim 800-160 MG tablet  Commonly known as: BACTRIM DS Take 1 tablet by mouth every 12 (twelve) hours for 8 days.            Durable Medical Equipment  (From admission, onward)         Start     Ordered   02/16/20 0755  For home use only DME Walker rolling  Once       Question Answer Comment  Walker: With 5 Inch Wheels   Patient needs a walker to treat with the following condition Ambulatory dysfunction      02/16/20 0754   02/16/20 0755  For home use only DME 3 n 1  Once        02/16/20 0754           Allergies  Allergen Reactions  . Duloxetine     Other reaction(s): Other (See Comments) Hallucinations Hallucinations   . Pregabalin     Other reaction(s): Other (See Comments) hallucinations hallucinations   . Trazodone Hives, Nausea Only and Nausea And Vomiting    Follow-up Information    [facility] Follow up.   Contact information: [address] [facility] [place] [address] [phone]                The results of significant diagnostics from this hospitalization (including imaging, microbiology, ancillary and laboratory) are listed below for reference.    Significant Diagnostic Studies: DG Chest Portable 1 View  Result Date: 02/15/2020 CLINICAL DATA:  Dyspnea.  Hyperglycemia. EXAM: PORTABLE CHEST 1 VIEW COMPARISON:  Radiograph 08/23/2016. High-resolution chest CT 10/22/2016 FINDINGS: Patient is rotated. The heart is normal in size. Mild aortic tortuosity. Previous subpleural opacities prior chest CT have resolved. No acute airspace disease. Multiple skin folds project over the left chest. No pulmonary edema. No pleural effusion. No acute osseous abnormalities are seen. IMPRESSION: 1. No acute abnormality. 2. Multiple skin folds project over the left chest. Electronically Signed   By: Narda Rutherford M.D.   On: 02/15/2020 01:49    Microbiology: Recent Results (from the past 240 hour(s))  Respiratory Panel by RT PCR (Flu A&B, Covid) - Nasopharyngeal Swab     Status: None   Collection Time: 02/15/20  1:34 AM   Specimen: Nasopharyngeal Swab  Result Value Ref Range Status   SARS Coronavirus 2 by RT PCR NEGATIVE NEGATIVE Final    Comment: (NOTE) SARS-CoV-2 target nucleic acids are NOT DETECTED.  The SARS-CoV-2 RNA is generally detectable in upper respiratoy specimens during the acute phase of infection. The lowest concentration of SARS-CoV-2 viral copies this assay can detect is 131 copies/mL. A negative result does not  preclude SARS-Cov-2 infection and should not be used as the sole basis for treatment or other patient management decisions. A negative result may occur with  improper specimen collection/handling, submission of specimen other than nasopharyngeal swab, presence of viral mutation(s) within the areas targeted by this assay, and inadequate number of viral copies (<131 copies/mL). A negative result must be combined with clinical observations, patient history, and epidemiological information. The expected result is Negative.  Fact Sheet for Patients:  https://www.moore.com/  Fact Sheet for Healthcare Providers:  https://www.young.biz/  This test is no t yet approved or cleared by the [place] FDA and  has been authorized for detection and/or diagnosis of SARS-CoV-2 by FDA under an Emergency Use Authorization (EUA). This EUA will remain  in effect (meaning this test can be used) for the duration of the COVID-19 declaration under Section 564(b)(1) of the Act,  21 U.S.C. section 360bbb-3(b)(1), unless the authorization is terminated or revoked sooner.     Influenza A by PCR NEGATIVE NEGATIVE Final   Influenza B by PCR NEGATIVE NEGATIVE Final    Comment: (NOTE) The Xpert Xpress SARS-CoV-2/FLU/RSV assay is intended as an aid in  the diagnosis of influenza from Nasopharyngeal swab specimens and  should not be used as a sole basis for treatment. Nasal washings and  aspirates are unacceptable for Xpert Xpress SARS-CoV-2/FLU/RSV  testing.  Fact Sheet for Patients: https://www.moore.com/  Fact Sheet for Healthcare Providers: https://www.young.biz/  This test is not yet approved or cleared by the [place] FDA and  has been authorized for detection and/or diagnosis of SARS-CoV-2 by  FDA under an Emergency Use Authorization (EUA). This EUA will remain  in effect (meaning this test can be used) for the  duration of the  Covid-19 declaration under Section 564(b)(1) of the Act, 21  U.S.C. section 360bbb-3(b)(1), unless the authorization is  terminated or revoked. Performed at [facility], [address] [place], [place] [address]   Culture, Urine     Status: Abnormal   Collection Time: 02/15/20  5:37 AM   Specimen: Urine, Random  Result Value Ref Range Status   Specimen Description   Final    URINE, RANDOM Performed at [facility], [address], [place], [place] [address]    Special Requests   Final    NONE Performed at [facility], [address] [place], [place] [address]    Culture (A)  Final    >=100,000 COLONIES/mL METHICILLIN RESISTANT STAPHYLOCOCCUS AUREUS   Report Status 02/18/2020 FINAL  Final   Organism ID, Bacteria METHICILLIN RESISTANT STAPHYLOCOCCUS AUREUS (A)  Final      Susceptibility   Methicillin resistant staphylococcus aureus - MIC*    CIPROFLOXACIN >=8 RESISTANT Resistant     GENTAMICIN <=0.5 SENSITIVE Sensitive     NITROFURANTOIN <=16 SENSITIVE Sensitive     OXACILLIN >=4 RESISTANT Resistant     TETRACYCLINE <=1 SENSITIVE Sensitive     VANCOMYCIN <=0.5 SENSITIVE Sensitive     TRIMETH/SULFA <=10 SENSITIVE Sensitive     CLINDAMYCIN <=0.25 SENSITIVE Sensitive     RIFAMPIN <=0.5 SENSITIVE Sensitive     Inducible Clindamycin NEGATIVE Sensitive     * >=100,000 COLONIES/mL METHICILLIN RESISTANT STAPHYLOCOCCUS AUREUS  Culture, blood (routine x 2)     Status: None (Preliminary result)   Collection Time: 02/18/20  6:42 AM   Specimen: BLOOD RIGHT ARM  Result Value Ref Range Status   Specimen Description   Final    BLOOD RIGHT ARM Performed at [facility], [address] [place], [place] [address]    Special Requests   Final    BOTTLES DRAWN AEROBIC AND ANAEROBIC Blood Culture adequate volume Performed at [facility], [address], [place], [place]  [address]    Culture   Final    NO GROWTH < 12 HOURS Performed at [facility], [address], [place], [place] [address]    Report Status PENDING  Incomplete  Culture, blood (routine x 2)     Status: None (Preliminary result)   Collection Time: 02/18/20  6:42 AM   Specimen: BLOOD RIGHT HAND  Result Value Ref Range Status   Specimen Description   Final    BLOOD RIGHT HAND Performed at [facility], [address] [place], [place] [address]    Special Requests   Final  BOTTLES DRAWN AEROBIC AND ANAEROBIC Blood Culture adequate volume Performed at [facility], [address] [place], [place] [address]    Culture   Final    NO GROWTH < 12 HOURS Performed at [facility], [address], [place], [place] [address]    Report Status PENDING  Incomplete  MRSA PCR Screening     Status: Abnormal   Collection Time: 02/18/20  2:19 PM   Specimen: Nasopharyngeal  Result Value Ref Range Status   MRSA by PCR POSITIVE (A) NEGATIVE Final    Comment:        The GeneXpert MRSA Assay (FDA approved for NASAL specimens only), is one component of a comprehensive MRSA colonization surveillance program. It is not intended to diagnose MRSA infection nor to guide or monitor treatment for MRSA infections. RESULT CALLED TO, READ BACK BY AND VERIFIED WITH: CATE,B RN @1630  ON 02/18/20 JACKSON,K Performed at [facility], [address], [place], [place]   Respiratory Panel by RT PCR (Flu A&B, Covid) - Nasopharyngeal Swab     Status: None   Collection Time: 02/18/20  4:13 PM   Specimen: Nasopharyngeal Swab  Result Value Ref Range Status   SARS Coronavirus 2 by RT PCR NEGATIVE NEGATIVE Final    Comment: (NOTE) SARS-CoV-2 target nucleic acids are NOT DETECTED.  The SARS-CoV-2 RNA is generally detectable in upper respiratoy specimens during the acute phase of infection. The lowest concentration of SARS-CoV-2 viral  copies this assay can detect is 131 copies/mL. A negative result does not preclude SARS-Cov-2 infection and should not be used as the sole basis for treatment or other patient management decisions. A negative result may occur with  improper specimen collection/handling, submission of specimen other than nasopharyngeal swab, presence of viral mutation(s) within the areas targeted by this assay, and inadequate number of viral copies (<131 copies/mL). A negative result must be combined with clinical observations, patient history, and epidemiological information. The expected result is Negative.  Fact Sheet for Patients:  02/20/20  Fact Sheet for Healthcare Providers:  https://www.moore.com/  This test is no t yet approved or cleared by the https://www.young.biz/ FDA and  has been authorized for detection and/or diagnosis of SARS-CoV-2 by FDA under an Emergency Use Authorization (EUA). This EUA will remain  in effect (meaning this test can be used) for the duration of the COVID-19 declaration under Section 564(b)(1) of the Act, 21 U.S.C. section 360bbb-3(b)(1), unless the authorization is terminated or revoked sooner.     Influenza A by PCR NEGATIVE NEGATIVE Final   Influenza B by PCR NEGATIVE NEGATIVE Final    Comment: (NOTE) The Xpert Xpress SARS-CoV-2/FLU/RSV assay is intended as an aid in  the diagnosis of influenza from Nasopharyngeal swab specimens and  should not be used as a sole basis for treatment. Nasal washings and  aspirates are unacceptable for Xpert Xpress SARS-CoV-2/FLU/RSV  testing.  Fact Sheet for Patients: [place]  Fact Sheet for Healthcare Providers: https://www.moore.com/  This test is not yet approved or cleared by the https://www.young.biz/ FDA and  has been authorized for detection and/or diagnosis of SARS-CoV-2 by  FDA under an Emergency Use Authorization (EUA). This  EUA will remain  in effect (meaning this test can be used) for the duration of the  Covid-19 declaration under Section 564(b)(1) of the Act, 21  U.S.C. section 360bbb-3(b)(1), unless the authorization is  terminated or revoked. Performed at [facility], [address] [place], [place]  [address]      Labs: Basic Metabolic Panel: Recent Labs  Lab 02/15/20 0833 02/15/20 1307 02/16/20 0321 02/17/20 0658 02/18/20 0642  NA 135 138 133* 136 134*  K 3.6 3.7 3.7 3.8 3.8  CL 101 103 100 104 103  CO2 GLUCOSE 232* 191* 328* 137* 155*  BUN 31* 29* 33* 26* 27*  CREATININE 0.98 0.81 1.00 1.13 1.16  CALCIUM 8.4* 8.7* 8.6* 8.5* 8.0*   Liver Function Tests: Recent Labs  Lab 02/15/20 0133 02/18/20 0642  AST 13* 73*  ALT 15 34  ALKPHOS 116 98  BILITOT 2.0* 1.2  PROT 7.1 5.4*  ALBUMIN 3.3* 2.4*   No results for input(s): LIPASE, AMYLASE in the last 168 hours. No results for input(s): AMMONIA in the last 168 hours. CBC: Recent Labs  Lab 02/15/20 0133 02/17/20 0658 02/18/20 0642  WBC 10.1 5.7 6.1  NEUTROABS 8.1* 3.7 4.4  HGB 12.9* 10.8* 9.7*  HCT 37.4* 31.5* 29.0*  MCV 86.8 87.7 88.4  PLT 105* 76* 76*   Cardiac Enzymes: No results for input(s): CKTOTAL, CKMB, CKMBINDEX, TROPONINI in the last 168 hours. BNP: BNP (last 3 results) No results for input(s): BNP in the last 8760 hours.  ProBNP (last 3 results) No results for input(s): PROBNP in the last 8760 hours.  CBG: Recent Labs  Lab 02/18/20 0835 02/18/20 1151 02/18/20 1648 02/18/20 2051 02/19/20 0740  GLUCAP 114* 122* 142* 161* 164*       Signed:  Darlin Drop, MD [facility] 02/19/2020, 10:04 AM

## 2020-02-20 LAB — BLOOD CULTURE ID PANEL (REFLEXED) - BCID2

## 2020-02-20 NOTE — Progress Notes (Signed)
The writer was called by Microbiology on 02/20/20 with result of blood culture taken on 02/18/20 growing MRSA 1/4 bottles.  Less likely a contaminate.  Patient was discharged to the [facility] inpatient psych unit/hospital in [place] on 02/19/20.  I called [phone] extension [phone] and spoke with Jaclyn Prime, RN and administrative on duty.  I informed him that patient needs to return to [facility] in [place], [place] due to MRSA bacteremia, for further workup and treatment.  He stated that they have a hospital there and a cardiologist and that the patient can be treated there.  I requested to speak with MD.  Tasia Catchings took my cell phone number and stated that the MD on call had already left for home.  Tasia Catchings RN will have MD call me back, Dr. Juliene Pina.  I explained to [employer] that one of the consequences of not treating MRSA bacteremia could be death.  He voiced understanding and stated that he will make sure that Dr. Norton Pastel calls me back.  Still awaiting a call back.

## 2020-02-20 NOTE — Progress Notes (Signed)
Received a call back from Dr. Durel Salts, MD (Psychiatry attending)  [phone].  She placed in a medical consult for MRSA bacteremia workup and treatment.  Informed Dr. Durel Salts that the patient may need a TEE and MRI of the spine.  Depending on their findings may require prolonged IV antibiotics.  MD will have the patient sent to the ED for an admission at the [facility] hospital in [place] today.

## 2020-02-22 LAB — CULTURE, BLOOD (ROUTINE X 2): Special Requests: ADEQUATE

## 2020-02-23 LAB — CULTURE, BLOOD (ROUTINE X 2)
Culture: NO GROWTH
Special Requests: ADEQUATE

## 2020-05-23 DEATH — deceased

## 2022-09-05 IMAGING — DX DG CHEST 1V PORT
1 series · 1 of 1 positions shown · non-contrast
Comparison: Radiograph 08/23/2016. High-resolution chest CT
10/22/2016

CLINICAL DATA: Dyspnea.  Hyperglycemia.

EXAM:
PORTABLE CHEST 1 VIEW

[chest ap]
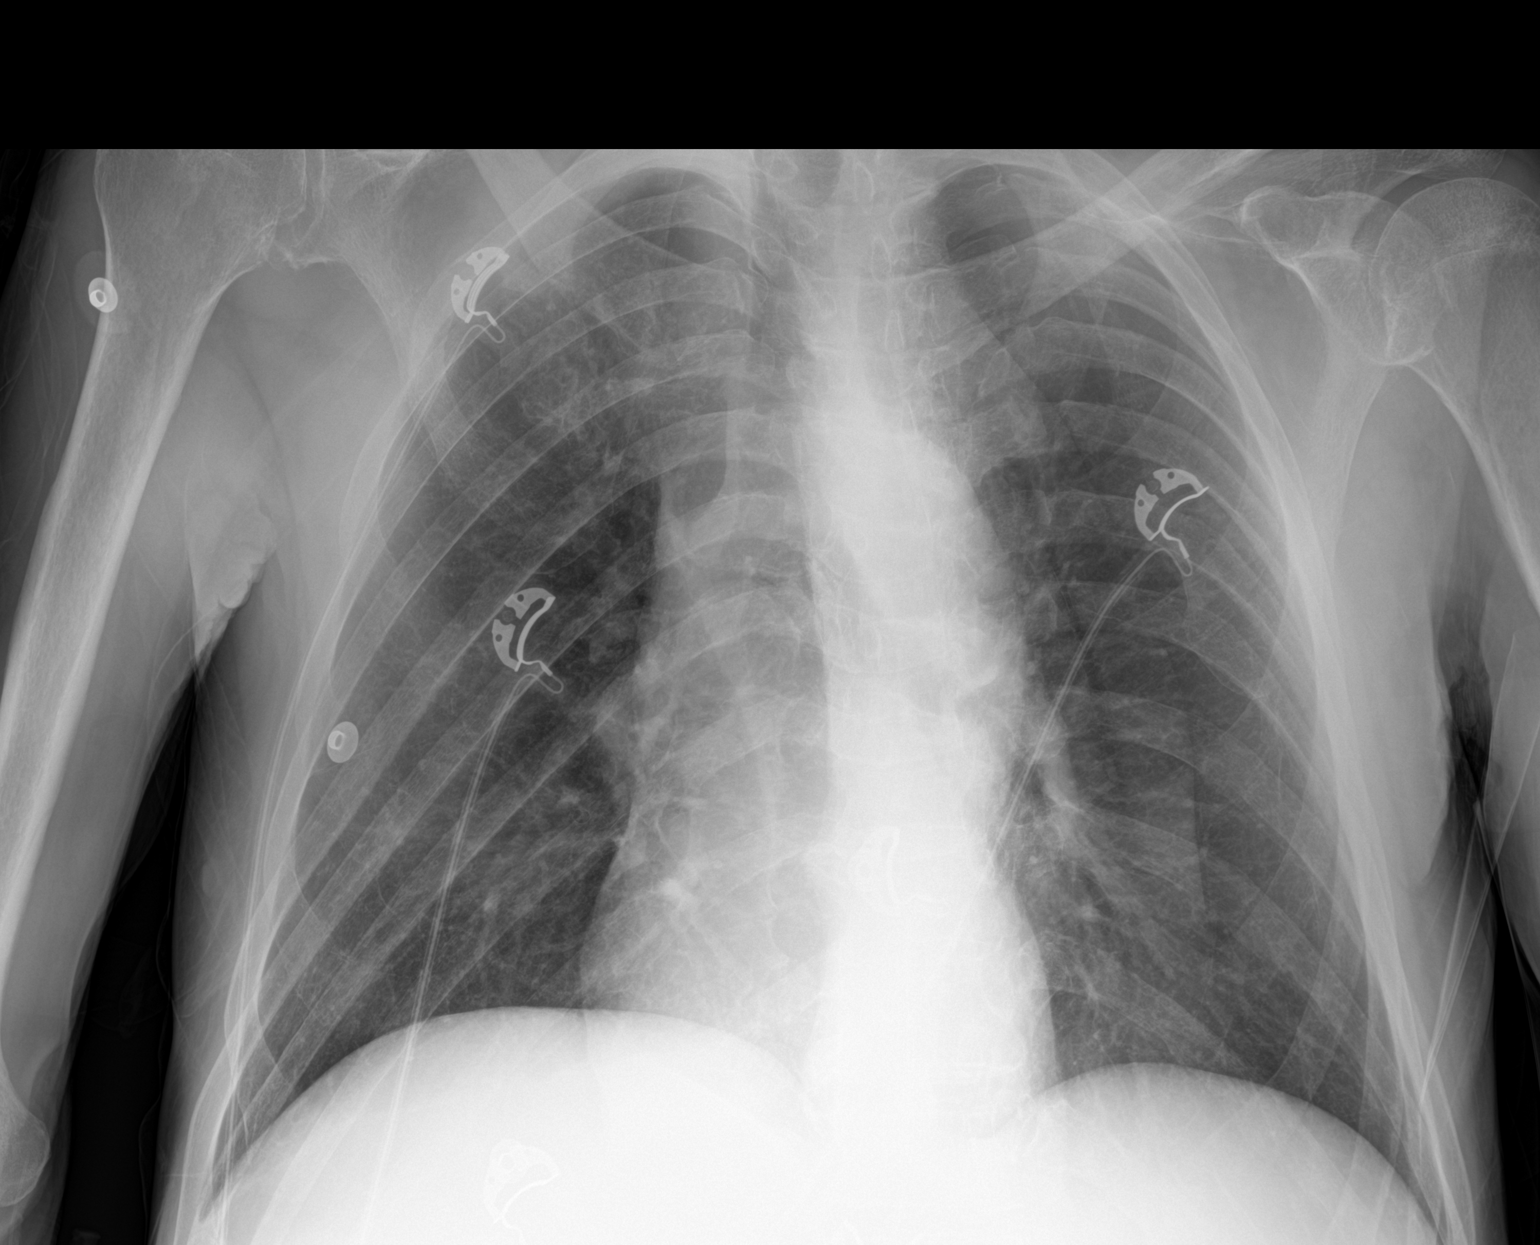

[1 of 1 positions shown; findings below may reference images not displayed]

FINDINGS: Patient is rotated. The heart is normal in size. Mild aortic
tortuosity. Previous subpleural opacities prior chest CT have
resolved. No acute airspace disease. Multiple skin folds project
over the left chest. No pulmonary edema. No pleural effusion. No
acute osseous abnormalities are seen.
IMPRESSION: 1. No acute abnormality.
2. Multiple skin folds project over the left chest.
